# Patient Record
Sex: Male | Born: 1955
Health system: Southern US, Community
[De-identification: ages and names within clinical notes are randomized; demographics above are authoritative.]

## PROBLEM LIST (undated history)

## (undated) DIAGNOSIS — G4730 Sleep apnea, unspecified: Secondary | ICD-10-CM

## (undated) DIAGNOSIS — E119 Type 2 diabetes mellitus without complications: Secondary | ICD-10-CM

## (undated) DIAGNOSIS — E785 Hyperlipidemia, unspecified: Secondary | ICD-10-CM

## (undated) DIAGNOSIS — I2510 Atherosclerotic heart disease of native coronary artery without angina pectoris: Secondary | ICD-10-CM

## (undated) DIAGNOSIS — K2210 Ulcer of esophagus without bleeding: Secondary | ICD-10-CM

## (undated) DIAGNOSIS — I10 Essential (primary) hypertension: Secondary | ICD-10-CM

## (undated) DIAGNOSIS — N183 Chronic kidney disease, stage 3 unspecified: Secondary | ICD-10-CM

## (undated) HISTORY — DX: Type 2 diabetes mellitus without complications: E11.9

## (undated) HISTORY — DX: Ulcer of esophagus without bleeding: K22.10

## (undated) HISTORY — PX: EYE SURGERY: SHX253

## (undated) HISTORY — DX: Sleep apnea, unspecified: G47.30

## (undated) HISTORY — PX: CATARACT EXTRACTION: SUR2

## (undated) HISTORY — PX: INTRAOCULAR PROSTHESES INSERTION: SHX360

## (undated) HISTORY — PX: CORONARY ANGIOPLASTY WITH STENT PLACEMENT: SHX49

## (undated) HISTORY — PX: ANKLE SURGERY: SHX546

---

## 2008-11-05 ENCOUNTER — Ambulatory Visit: Payer: Self-pay | Admitting: Diagnostic Radiology

## 2008-11-05 ENCOUNTER — Emergency Department (HOSPITAL_BASED_OUTPATIENT_CLINIC_OR_DEPARTMENT_OTHER): Admission: EM | Admit: 2008-11-05 | Discharge: 2008-11-05 | Payer: Self-pay | Admitting: Emergency Medicine

## 2010-08-16 LAB — POCT CARDIAC MARKERS
CKMB, poc: 2.3 ng/mL (ref 1.0–8.0)
Myoglobin, poc: >500 ng/mL (ref 12–200)
Troponin i, poc: <0.05 ng/mL (ref 0.00–0.09)

## 2010-08-16 LAB — BASIC METABOLIC PANEL
BUN: 34 mg/dL — ABNORMAL HIGH (ref 6–23)
CO2: 22 mEq/L (ref 19–32)
Calcium: 8.4 mg/dL (ref 8.4–10.5)
Chloride: 109 mEq/L (ref 96–112)
Creatinine, Ser: 1.8 mg/dL — ABNORMAL HIGH (ref 0.4–1.5)
GFR calc Af Amer: 48 mL/min — ABNORMAL LOW (ref >60)
GFR calc non Af Amer: 40 mL/min — ABNORMAL LOW (ref >60)
Glucose, Bld: 240 mg/dL — ABNORMAL HIGH (ref 70–99)
Potassium: 5.5 mEq/L — ABNORMAL HIGH (ref 3.5–5.1)
Sodium: 139 mEq/L (ref 135–145)

## 2010-08-16 LAB — CBC
HCT: 34.7 % — ABNORMAL LOW (ref 39.0–52.0)
Hemoglobin: 11.9 g/dL — ABNORMAL LOW (ref 13.0–17.0)
MCHC: 34.4 g/dL (ref 30.0–36.0)
MCV: 92.5 fL (ref 78.0–100.0)
Platelets: 244 10*3/uL (ref 150–400)
RBC: 3.75 MIL/uL — ABNORMAL LOW (ref 4.22–5.81)
RDW: 12.3 % (ref 11.5–15.5)
WBC: 8.3 10*3/uL (ref 4.0–10.5)

## 2010-08-16 LAB — DIFFERENTIAL
Basophils Absolute: 0 10*3/uL (ref 0.0–0.1)
Basophils Relative: 0 % (ref 0–1)
Eosinophils Absolute: 0 10*3/uL (ref 0.0–0.7)
Eosinophils Relative: 1 % (ref 0–5)
Lymphocytes Relative: 17 % (ref 12–46)
Lymphs Abs: 1.4 10*3/uL (ref 0.7–4.0)
Monocytes Absolute: 0.6 10*3/uL (ref 0.1–1.0)
Monocytes Relative: 7 % (ref 3–12)
Neutro Abs: 6.3 10*3/uL (ref 1.7–7.7)
Neutrophils Relative %: 75 % (ref 43–77)

## 2013-01-13 ENCOUNTER — Encounter: Payer: Self-pay | Admitting: Family Medicine

## 2013-01-13 ENCOUNTER — Ambulatory Visit (INDEPENDENT_AMBULATORY_CARE_PROVIDER_SITE_OTHER): Payer: 59 | Admitting: Family Medicine

## 2013-01-13 VITALS — BP 106/78 | HR 90 | Temp 98.2°F | Resp 16 | Ht 71.0 in | Wt 206.0 lb

## 2013-01-13 DIAGNOSIS — M79609 Pain in unspecified limb: Secondary | ICD-10-CM

## 2013-01-13 DIAGNOSIS — M109 Gout, unspecified: Secondary | ICD-10-CM

## 2013-01-13 DIAGNOSIS — E119 Type 2 diabetes mellitus without complications: Secondary | ICD-10-CM

## 2013-01-13 DIAGNOSIS — M79672 Pain in left foot: Secondary | ICD-10-CM

## 2013-01-13 DIAGNOSIS — E1129 Type 2 diabetes mellitus with other diabetic kidney complication: Secondary | ICD-10-CM | POA: Insufficient documentation

## 2013-01-13 LAB — POCT CBC
Granulocyte percent: 68.9 %G (ref 37–80)
HCT, POC: 44.8 % (ref 43.5–53.7)
Hemoglobin: 14.8 g/dL (ref 14.1–18.1)
Lymph, poc: 1.6 (ref 0.6–3.4)
MCH, POC: 30.5 pg (ref 27–31.2)
MCHC: 33 g/dL (ref 31.8–35.4)
MCV: 92.2 fL (ref 80–97)
MID (cbc): 0.4 (ref 0–0.9)
MPV: 8.3 fL (ref 0–99.8)
POC Granulocyte: 4.4 (ref 2–6.9)
POC LYMPH PERCENT: 24.6 %L (ref 10–50)
POC MID %: 6.5 %M (ref 0–12)
Platelet Count, POC: 280 10*3/uL (ref 142–424)
RBC: 4.86 M/uL (ref 4.69–6.13)
RDW, POC: 13.2 %
WBC: 6.4 10*3/uL (ref 4.6–10.2)

## 2013-01-13 LAB — URIC ACID: Uric Acid, Serum: 7.1 mg/dL (ref 4.0–7.8)

## 2013-01-13 MED ORDER — TRAMADOL HCL 50 MG PO TABS: 50.0000 mg | ORAL_TABLET | Freq: Three times a day (TID) | ORAL | Status: DC | PRN

## 2013-01-13 MED ORDER — COLCHICINE 0.6 MG PO TABS: ORAL_TABLET | ORAL | Status: DC

## 2013-01-13 MED ORDER — CEPHALEXIN 500 MG PO CAPS: 500.0000 mg | ORAL_CAPSULE | Freq: Two times a day (BID) | ORAL | Status: DC

## 2013-01-13 MED ORDER — INDOMETHACIN 50 MG PO CAPS: 50.0000 mg | ORAL_CAPSULE | Freq: Three times a day (TID) | ORAL | Status: DC

## 2013-01-13 NOTE — Patient Instructions (Addendum)
It appears that you have gout in your left big toe.   Use the colchicine as directed, and the indomethacin and tramadol as needed for pain (the tramadol can make you a bit sleepy).  The indomethacin could raise your blood pressure- keep an eye on this if possible.    You may also use the keflex rx for possible infection.   However, with a normal white blood cell count an infection is less likely.  Let me know if you are not feeling better in the next few days- Sooner if worse.

## 2013-01-13 NOTE — Progress Notes (Signed)
[facility] [address], [place] [address] [phone]  Date:  01/13/2013   Name:  Hayden Williams   DOB:  1955-05-27   MRN:  [id number]  PCP:  No primary provider on file.    Chief Complaint: Foot Pain   History of Present Illness:  Hayden Williams is a 57 y.o. very pleasant male patient who presents with the following:  He is here today with left foot pain.  He has noted a bunion for several years but has never had gout or pain like this in the past.  NKI.  He noted onset of pain at the first MCP about 3 days ago.  He is not aware of any family history of gout.   No unusual foods or recent shellfish.    He has a history of IDDM.  His PCP is with [facility].   He has not tried any medications as of yet for his foot pain.   Is is otherwise generally healthy  NKDA Here today with his wife. Never a smoker There are no active problems to display for this patient.   Past Medical History  Diagnosis Date  . Diabetes mellitus without complication     Past Surgical History  Procedure Laterality Date  . Eye surgery      History  Substance Use Topics  . Smoking status: Never Smoker   . Smokeless tobacco: Never Used  . Alcohol Use: No    Family History  Problem Relation Age of Onset  . Diabetes Mother   . Heart disease Mother   . Cancer Father   . Diabetes Sister     No Known Allergies  Medication list has been reviewed and updated.  No current outpatient prescriptions on file prior to visit.   No current facility-administered medications on file prior to visit.    Review of Systems:  As per HPI- otherwise negative.   Physical Examination: Filed Vitals:   01/13/13 1439  BP: 106/78  Pulse: 100  Temp: 99 F (37.2 C)  Resp: 16   Filed Vitals:   01/13/13 1439  Height: 5\' 11"  (1.803 m)  Weight: 206 lb (93.441 kg)   Body mass index is 28.74 kg/(m^2). Ideal Body Weight: Weight in (lb) to have BMI = 25: 178.9  GEN: WDWN, NAD,  Non-toxic, A & O x 3, looks well HEENT: Atraumatic, Normocephalic. Neck supple. No masses, No LAD. Ears and Nose: No external deformity. CV: RRR, No M/G/R. No JVD. No thrill. No extra heart sounds. PULM: CTA B, no wheezes, crackles, rhonchi. No retractions. No resp. distress. No accessory muscle use. ABD: S, NT, ND, +BS. No rebound. No HSM. EXTR: No c/c/e NEURO favoring left slightly PSYCH: Normally interactive. Conversant. Not depressed or anxious appearing.  Calm demeanor.  Left foot: the 1st MTP is red, slightly swollen, warm and tender.  There is some tracking of the redness into the mid- foot.  Normal pulses and perfusion.    Results for orders placed in visit on 01/13/13  POCT CBC      Result Value Range   WBC 6.4  4.6 - 10.2 K/uL   Lymph, poc 1.6  0.6 - 3.4   POC LYMPH PERCENT 24.6  10 - 50 %L   MID (cbc) 0.4  0 - 0.9   POC MID % 6.5  0 - 12 %M   POC Granulocyte 4.4  2 - 6.9   Granulocyte percent 68.9  37 - 80 %G  RBC 4.86  4.69 - 6.13 M/uL   Hemoglobin 14.8  14.1 - 18.1 g/dL   HCT, POC 81.1  91.4 - 53.7 %   MCV 92.2  80 - 97 fL   MCH, POC 30.5  27 - 31.2 pg   MCHC 33.0  31.8 - 35.4 g/dL   RDW, POC 78.2     Platelet Count, POC 280  142 - 424 K/uL   MPV 8.3  0 - 99.8 fL     Assessment and Plan: Gout attack - Plan: Uric Acid, colchicine 0.6 MG tablet, indomethacin (INDOCIN) 50 MG capsule, cephALEXin (KEFLEX) 500 MG capsule  Type II or unspecified type diabetes mellitus without mention of complication, not stated as uncontrolled  Left foot pain - Plan: POCT CBC, traMADol (ULTRAM) 50 MG tablet  Likely gout.  Will treat with colchicine, indocin and tramadol if needed for pain.  Declines crutches today.  Await uric acid level.   Gave rx for keflex due to slight tracking of redness.  However, normal WBC count is reassuring.  He can start the keflex or wait and see how he is tomorrow.    Signed Abbe Amsterdam, MD

## 2013-01-20 ENCOUNTER — Encounter: Payer: Self-pay | Admitting: Family Medicine

## 2013-01-25 ENCOUNTER — Other Ambulatory Visit (INDEPENDENT_AMBULATORY_CARE_PROVIDER_SITE_OTHER): Payer: 59

## 2013-01-25 DIAGNOSIS — E119 Type 2 diabetes mellitus without complications: Secondary | ICD-10-CM

## 2013-01-25 DIAGNOSIS — M109 Gout, unspecified: Secondary | ICD-10-CM

## 2013-01-25 LAB — BASIC METABOLIC PANEL
BUN: 21 mg/dL (ref 6–23)
CO2: 25 mEq/L (ref 19–32)
Calcium: 8.4 mg/dL (ref 8.4–10.5)
Chloride: 109 mEq/L (ref 96–112)
Creat: 1.59 mg/dL — ABNORMAL HIGH (ref 0.50–1.35)
Glucose, Bld: 199 mg/dL — ABNORMAL HIGH (ref 70–99)
Potassium: 3.8 mEq/L (ref 3.5–5.3)
Sodium: 140 mEq/L (ref 135–145)

## 2013-01-25 NOTE — Addendum Note (Signed)
Addended by: Cira Rue R on: 01/25/2013 12:35 PM   Modules accepted: Orders

## 2013-01-25 NOTE — Progress Notes (Signed)
Patient here for labs only.  BMP not sent out at last visit.  Our error/rcoe

## 2013-01-26 ENCOUNTER — Encounter: Payer: Self-pay | Admitting: Family Medicine

## 2013-01-26 NOTE — Progress Notes (Signed)
Called and LMOM. Let him know that I received his BMP.  It looks ok except his renal funcation is still up- this is better that it was in 2010 (the last labs we have on file).  Be sure to follow-up with PCP as directed, will mail him a copy of his labs Creat clearance is 59.7

## 2013-11-19 ENCOUNTER — Other Ambulatory Visit: Payer: Self-pay | Admitting: Emergency Medicine

## 2013-11-19 DIAGNOSIS — R2241 Localized swelling, mass and lump, right lower limb: Secondary | ICD-10-CM

## 2013-11-20 ENCOUNTER — Ambulatory Visit
Admission: RE | Admit: 2013-11-20 | Discharge: 2013-11-20 | Disposition: A | Discharge status: Home / Self Care | Payer: 59 | Source: Ambulatory Visit | Attending: Emergency Medicine | Admitting: Emergency Medicine

## 2013-11-20 ENCOUNTER — Encounter (INDEPENDENT_AMBULATORY_CARE_PROVIDER_SITE_OTHER): Payer: Self-pay

## 2013-11-20 DIAGNOSIS — R2241 Localized swelling, mass and lump, right lower limb: Secondary | ICD-10-CM

## 2014-12-02 ENCOUNTER — Encounter (HOSPITAL_COMMUNITY): Payer: Self-pay | Admitting: Emergency Medicine

## 2014-12-02 ENCOUNTER — Inpatient Hospital Stay (HOSPITAL_COMMUNITY)
Admission: EM | Admit: 2014-12-02 | Discharge: 2014-12-03 | DRG: 247 | Disposition: A | Discharge status: Home / Self Care | Payer: 59 | Attending: Cardiology | Admitting: Cardiology

## 2014-12-02 ENCOUNTER — Encounter (HOSPITAL_COMMUNITY)
Admission: EM | Disposition: A | Discharge status: Home / Self Care | Payer: Self-pay | Source: Home / Self Care | Attending: Cardiology

## 2014-12-02 ENCOUNTER — Emergency Department (HOSPITAL_COMMUNITY): Payer: 59

## 2014-12-02 DIAGNOSIS — Z955 Presence of coronary angioplasty implant and graft: Secondary | ICD-10-CM

## 2014-12-02 DIAGNOSIS — I25110 Atherosclerotic heart disease of native coronary artery with unstable angina pectoris: Secondary | ICD-10-CM | POA: Diagnosis not present

## 2014-12-02 DIAGNOSIS — E785 Hyperlipidemia, unspecified: Secondary | ICD-10-CM | POA: Diagnosis not present

## 2014-12-02 DIAGNOSIS — Z833 Family history of diabetes mellitus: Secondary | ICD-10-CM | POA: Diagnosis not present

## 2014-12-02 DIAGNOSIS — I129 Hypertensive chronic kidney disease with stage 1 through stage 4 chronic kidney disease, or unspecified chronic kidney disease: Secondary | ICD-10-CM | POA: Diagnosis present

## 2014-12-02 DIAGNOSIS — E119 Type 2 diabetes mellitus without complications: Secondary | ICD-10-CM | POA: Diagnosis present

## 2014-12-02 DIAGNOSIS — R079 Chest pain, unspecified: Secondary | ICD-10-CM

## 2014-12-02 DIAGNOSIS — R0789 Other chest pain: Secondary | ICD-10-CM | POA: Diagnosis not present

## 2014-12-02 DIAGNOSIS — I200 Unstable angina: Secondary | ICD-10-CM | POA: Diagnosis present

## 2014-12-02 DIAGNOSIS — E1129 Type 2 diabetes mellitus with other diabetic kidney complication: Secondary | ICD-10-CM

## 2014-12-02 DIAGNOSIS — I249 Acute ischemic heart disease, unspecified: Secondary | ICD-10-CM

## 2014-12-02 DIAGNOSIS — Z8249 Family history of ischemic heart disease and other diseases of the circulatory system: Secondary | ICD-10-CM

## 2014-12-02 DIAGNOSIS — Z794 Long term (current) use of insulin: Secondary | ICD-10-CM | POA: Diagnosis not present

## 2014-12-02 DIAGNOSIS — N183 Chronic kidney disease, stage 3 unspecified: Secondary | ICD-10-CM

## 2014-12-02 DIAGNOSIS — I2510 Atherosclerotic heart disease of native coronary artery without angina pectoris: Secondary | ICD-10-CM

## 2014-12-02 DIAGNOSIS — R072 Precordial pain: Secondary | ICD-10-CM | POA: Insufficient documentation

## 2014-12-02 HISTORY — DX: Chronic kidney disease, stage 3 (moderate): N18.3

## 2014-12-02 HISTORY — DX: Chronic kidney disease, stage 3 unspecified: N18.30

## 2014-12-02 HISTORY — PX: CARDIAC CATHETERIZATION: SHX172

## 2014-12-02 HISTORY — DX: Hyperlipidemia, unspecified: E78.5

## 2014-12-02 HISTORY — DX: Atherosclerotic heart disease of native coronary artery without angina pectoris: I25.10

## 2014-12-02 HISTORY — DX: Essential (primary) hypertension: I10

## 2014-12-02 LAB — BASIC METABOLIC PANEL
Anion gap: 9 (ref 5–15)
BUN: 22 mg/dL — ABNORMAL HIGH (ref 6–20)
CO2: 23 mmol/L (ref 22–32)
Calcium: 8.3 mg/dL — ABNORMAL LOW (ref 8.9–10.3)
Chloride: 108 mmol/L (ref 101–111)
Creatinine, Ser: 1.77 mg/dL — ABNORMAL HIGH (ref 0.61–1.24)
GFR calc Af Amer: 47 mL/min — ABNORMAL LOW (ref >60)
GFR calc non Af Amer: 41 mL/min — ABNORMAL LOW (ref >60)
Glucose, Bld: 198 mg/dL — ABNORMAL HIGH (ref 65–99)
Potassium: 3.8 mmol/L (ref 3.5–5.1)
Sodium: 140 mmol/L (ref 135–145)

## 2014-12-02 LAB — CBC
HCT: 39.7 % (ref 39.0–52.0)
Hemoglobin: 14.1 g/dL (ref 13.0–17.0)
MCH: 30.3 pg (ref 26.0–34.0)
MCHC: 35.5 g/dL (ref 30.0–36.0)
MCV: 85.4 fL (ref 78.0–100.0)
Platelets: 242 10*3/uL (ref 150–400)
RBC: 4.65 MIL/uL (ref 4.22–5.81)
RDW: 12.5 % (ref 11.5–15.5)
WBC: 6 10*3/uL (ref 4.0–10.5)

## 2014-12-02 LAB — URINALYSIS, ROUTINE W REFLEX MICROSCOPIC
Bilirubin Urine: NEGATIVE
Glucose, UA: 250 mg/dL — AB
Ketones, ur: NEGATIVE mg/dL
Leukocytes, UA: NEGATIVE
Nitrite: NEGATIVE
Protein, ur: >300 mg/dL — AB
Specific Gravity, Urine: 1.026 (ref 1.005–1.030)
Urobilinogen, UA: 0.2 mg/dL (ref 0.0–1.0)
pH: 5.5 (ref 5.0–8.0)

## 2014-12-02 LAB — MRSA PCR SCREENING: MRSA by PCR: NEGATIVE

## 2014-12-02 LAB — POCT ACTIVATED CLOTTING TIME: Activated Clotting Time: 294 seconds

## 2014-12-02 LAB — GLUCOSE, CAPILLARY: Glucose-Capillary: 139 mg/dL — ABNORMAL HIGH (ref 65–99)

## 2014-12-02 LAB — I-STAT TROPONIN, ED: Troponin i, poc: 0.01 ng/mL (ref 0.00–0.08)

## 2014-12-02 LAB — URINE MICROSCOPIC-ADD ON

## 2014-12-02 SURGERY — LEFT HEART CATH AND CORONARY ANGIOGRAPHY
Anesthesia: LOCAL

## 2014-12-02 MED ORDER — HEPARIN SODIUM (PORCINE) 1000 UNIT/ML IJ SOLN
INTRAMUSCULAR | Status: DC | PRN
  Administered 2014-12-02 (×2): 5000 [IU] via INTRAVENOUS

## 2014-12-02 MED ORDER — OXYCODONE-ACETAMINOPHEN 5-325 MG PO TABS: 1.0000 | ORAL_TABLET | ORAL | Status: DC | PRN

## 2014-12-02 MED ORDER — CLOPIDOGREL BISULFATE 300 MG PO TABS
ORAL_TABLET | ORAL | Status: DC | PRN
  Administered 2014-12-02 (×2): 300 mg via ORAL

## 2014-12-02 MED ORDER — VERAPAMIL HCL 2.5 MG/ML IV SOLN
INTRAVENOUS | Status: DC | PRN
  Administered 2014-12-02: 15:00:00 via INTRA_ARTERIAL

## 2014-12-02 MED ORDER — INSULIN GLARGINE 100 UNIT/ML ~~LOC~~ SOLN: 31.0000 [IU] | Freq: Every day | SUBCUTANEOUS | Status: DC

## 2014-12-02 MED ORDER — ASPIRIN 81 MG PO CHEW
324.0000 mg | CHEWABLE_TABLET | Freq: Once | ORAL | Status: AC
Start: 2014-12-02 — End: 2014-12-02
  Administered 2014-12-02: 243 mg via ORAL
  Filled 2014-12-02: qty 4

## 2014-12-02 MED ORDER — HYDRALAZINE HCL 20 MG/ML IJ SOLN
INTRAMUSCULAR | Status: AC
  Filled 2014-12-02: qty 1

## 2014-12-02 MED ORDER — INSULIN GLARGINE 100 UNIT/ML ~~LOC~~ SOLN
45.0000 [IU] | Freq: Every day | SUBCUTANEOUS | Status: DC
  Administered 2014-12-02: 45 [IU] via SUBCUTANEOUS
  Filled 2014-12-02 (×2): qty 0.45

## 2014-12-02 MED ORDER — SODIUM CHLORIDE 0.9 % IV SOLN
INTRAVENOUS | Status: AC
  Administered 2014-12-02: 19:00:00 via INTRAVENOUS

## 2014-12-02 MED ORDER — SODIUM CHLORIDE 0.9 % IV SOLN: 250.0000 mL | INTRAVENOUS | Status: DC | PRN

## 2014-12-02 MED ORDER — CLOPIDOGREL BISULFATE 75 MG PO TABS
75.0000 mg | ORAL_TABLET | Freq: Every day | ORAL | Status: DC
  Administered 2014-12-03: 75 mg via ORAL
  Filled 2014-12-02 (×2): qty 1

## 2014-12-02 MED ORDER — SODIUM CHLORIDE 0.9 % IV SOLN
INTRAVENOUS | Status: DC | PRN
  Administered 2014-12-02: 150 mL/h via INTRAVENOUS

## 2014-12-02 MED ORDER — ATORVASTATIN CALCIUM 80 MG PO TABS
80.0000 mg | ORAL_TABLET | Freq: Every day | ORAL | Status: DC
  Administered 2014-12-02: 80 mg via ORAL
  Filled 2014-12-02 (×2): qty 1

## 2014-12-02 MED ORDER — ASPIRIN 81 MG PO CHEW
81.0000 mg | CHEWABLE_TABLET | ORAL | Status: AC
  Administered 2014-12-02: 81 mg via ORAL
  Filled 2014-12-02: qty 1

## 2014-12-02 MED ORDER — FENTANYL CITRATE (PF) 100 MCG/2ML IJ SOLN
INTRAMUSCULAR | Status: DC | PRN
  Administered 2014-12-02: 50 ug via INTRAVENOUS
  Administered 2014-12-02: 25 ug via INTRAVENOUS

## 2014-12-02 MED ORDER — GLYBURIDE 5 MG PO TABS
5.0000 mg | ORAL_TABLET | Freq: Two times a day (BID) | ORAL | Status: DC
  Administered 2014-12-02: 5 mg via ORAL
  Filled 2014-12-02 (×2): qty 1

## 2014-12-02 MED ORDER — HYDRALAZINE HCL 20 MG/ML IJ SOLN
INTRAMUSCULAR | Status: DC | PRN
  Administered 2014-12-02: 10 mg via INTRAVENOUS

## 2014-12-02 MED ORDER — LIDOCAINE HCL (PF) 1 % IJ SOLN
INTRAMUSCULAR | Status: AC
  Filled 2014-12-02: qty 30

## 2014-12-02 MED ORDER — LOSARTAN POTASSIUM 50 MG PO TABS
50.0000 mg | ORAL_TABLET | Freq: Every day | ORAL | Status: DC
  Administered 2014-12-03: 50 mg via ORAL
  Filled 2014-12-02 (×3): qty 1

## 2014-12-02 MED ORDER — HYDRALAZINE HCL 20 MG/ML IJ SOLN
20.0000 mg | INTRAMUSCULAR | Status: DC | PRN
  Administered 2014-12-02 – 2014-12-03 (×2): 20 mg via INTRAVENOUS
  Filled 2014-12-02 (×2): qty 1

## 2014-12-02 MED ORDER — SODIUM CHLORIDE 0.9 % IJ SOLN: 3.0000 mL | Freq: Two times a day (BID) | INTRAMUSCULAR | Status: DC

## 2014-12-02 MED ORDER — ONDANSETRON HCL 4 MG/2ML IJ SOLN
4.0000 mg | Freq: Four times a day (QID) | INTRAMUSCULAR | Status: DC | PRN
  Administered 2014-12-02: 4 mg via INTRAVENOUS
  Filled 2014-12-02: qty 2

## 2014-12-02 MED ORDER — TRIAMTERENE-HCTZ 37.5-25 MG PO CAPS
1.0000 | ORAL_CAPSULE | Freq: Every day | ORAL | Status: DC
  Administered 2014-12-03: 1 via ORAL
  Filled 2014-12-02: qty 1

## 2014-12-02 MED ORDER — NITROGLYCERIN 1 MG/10 ML FOR IR/CATH LAB
INTRA_ARTERIAL | Status: AC
  Filled 2014-12-02: qty 10

## 2014-12-02 MED ORDER — MIDAZOLAM HCL 2 MG/2ML IJ SOLN
INTRAMUSCULAR | Status: AC
  Filled 2014-12-02: qty 2

## 2014-12-02 MED ORDER — SODIUM CHLORIDE 0.9 % IJ SOLN: 3.0000 mL | INTRAMUSCULAR | Status: DC | PRN

## 2014-12-02 MED ORDER — MIDAZOLAM HCL 2 MG/2ML IJ SOLN
INTRAMUSCULAR | Status: DC | PRN
  Administered 2014-12-02: 1 mg via INTRAVENOUS
  Administered 2014-12-02: 2 mg via INTRAVENOUS

## 2014-12-02 MED ORDER — FENTANYL CITRATE (PF) 100 MCG/2ML IJ SOLN
INTRAMUSCULAR | Status: AC
  Filled 2014-12-02: qty 2

## 2014-12-02 MED ORDER — ASPIRIN 81 MG PO CHEW
81.0000 mg | CHEWABLE_TABLET | Freq: Every day | ORAL | Status: DC
  Administered 2014-12-03: 81 mg via ORAL
  Filled 2014-12-02: qty 1

## 2014-12-02 MED ORDER — ACETAMINOPHEN 325 MG PO TABS: 650.0000 mg | ORAL_TABLET | ORAL | Status: DC | PRN

## 2014-12-02 MED ORDER — MORPHINE SULFATE 2 MG/ML IJ SOLN: 2.0000 mg | INTRAMUSCULAR | Status: DC | PRN

## 2014-12-02 MED ORDER — METOPROLOL TARTRATE 12.5 MG HALF TABLET
25.0000 mg | ORAL_TABLET | Freq: Two times a day (BID) | ORAL | Status: DC
  Administered 2014-12-02 – 2014-12-03 (×2): 25 mg via ORAL
  Filled 2014-12-02 (×3): qty 2

## 2014-12-02 MED ORDER — INSULIN GLARGINE 100 UNIT/ML ~~LOC~~ SOLN
10.0000 [IU] | Freq: Every day | SUBCUTANEOUS | Status: DC
  Filled 2014-12-02: qty 0.1

## 2014-12-02 MED ORDER — SODIUM CHLORIDE 0.9 % IJ SOLN
3.0000 mL | Freq: Two times a day (BID) | INTRAMUSCULAR | Status: DC
  Administered 2014-12-02 – 2014-12-03 (×2): 3 mL via INTRAVENOUS

## 2014-12-02 MED ORDER — SODIUM CHLORIDE 0.9 % IV SOLN
INTRAVENOUS | Status: DC
  Administered 2014-12-02: 15:00:00 via INTRAVENOUS

## 2014-12-02 MED ORDER — MIDAZOLAM HCL 2 MG/2ML IJ SOLN
INTRAMUSCULAR | Status: AC
Start: 2014-12-02 — End: 2014-12-02
  Filled 2014-12-02: qty 2

## 2014-12-02 MED ORDER — HEPARIN (PORCINE) IN NACL 2-0.9 UNIT/ML-% IJ SOLN
INTRAMUSCULAR | Status: AC
  Filled 2014-12-02: qty 1000

## 2014-12-02 MED ORDER — CLOPIDOGREL BISULFATE 300 MG PO TABS
ORAL_TABLET | ORAL | Status: AC
  Filled 2014-12-02: qty 2

## 2014-12-02 SURGICAL SUPPLY — 19 items
BALLN EUPHORA RX 2.0X12 (BALLOONS) ×2
BALLN ~~LOC~~ EUPHORA RX 2.5X12 (BALLOONS) ×2
BALLOON EUPHORA RX 2.0X12 (BALLOONS) ×1 IMPLANT
BALLOON ~~LOC~~ EUPHORA RX 2.5X12 (BALLOONS) ×1 IMPLANT
CATH INFINITI 5 FR JL3.5 (CATHETERS) ×2 IMPLANT
CATH INFINITI 5FR ANG PIGTAIL (CATHETERS) ×2 IMPLANT
CATH INFINITI JR4 5F (CATHETERS) ×2 IMPLANT
CATH VISTA GUIDE 6FR JR4 (CATHETERS) ×2 IMPLANT
DEVICE RAD COMP TR BAND LRG (VASCULAR PRODUCTS) ×2 IMPLANT
GLIDESHEATH SLEND SS 6F .021 (SHEATH) ×2 IMPLANT
KIT ENCORE 26 ADVANTAGE (KITS) ×2 IMPLANT
KIT HEART LEFT (KITS) ×2 IMPLANT
PACK CARDIAC CATHETERIZATION (CUSTOM PROCEDURE TRAY) ×2 IMPLANT
STENT PROMUS PREM MR 2.25X20 (Permanent Stent) ×2 IMPLANT
SYR MEDRAD MARK V 150ML (SYRINGE) ×2 IMPLANT
TRANSDUCER W/STOPCOCK (MISCELLANEOUS) ×2 IMPLANT
TUBING CIL FLEX 10 FLL-RA (TUBING) ×2 IMPLANT
WIRE COUGAR XT STRL 190CM (WIRE) ×2 IMPLANT
WIRE SAFE-T 1.5MM-J .035X260CM (WIRE) ×2 IMPLANT

## 2014-12-02 NOTE — Interval H&P Note (Signed)
History and Physical Interval Note:  12/02/2014 2:59 PM  Hayden Williams  has presented today for surgery, with the diagnosis of unstable angina. The various methods of treatment have been discussed with the patient and family. After consideration of risks, benefits and other options for treatment, the patient has consented to  Procedure(s): Left Heart Cath and Coronary Angiography (N/A) as a surgical intervention .  The patient's history has been reviewed, patient examined, no change in status, stable for surgery.  I have reviewed the patient's chart and labs.  Questions were answered to the patient's satisfaction.    Cath Lab Visit (complete for each Cath Lab visit)  Clinical Evaluation Leading to the Procedure:   ACS: No.  Non-ACS:    Anginal Classification: CCS III  Anti-ischemic medical therapy: No Therapy  Non-Invasive Test Results: No non-invasive testing performed  Prior CABG: No previous CABG         MCALHANY,CHRISTOPHER

## 2014-12-02 NOTE — Progress Notes (Signed)
eLink Physician-Brief Progress Note Patient Name: Hayden Williams DOB: 02-08-1956 MRN: [id number]   Date of Service  12/02/2014  HPI/Events of Note  59 yo male with PMH of DM. Admitted to the ICU with NSTEMI. Now s/p Cardiac Cath Lab with Stent to RCA. Current medical regimen includes ASA, Lipitor, Metoprolol, Cozaar, Glyburide, Dyazide, Hydralazine IV PRN and Plavix. Management per Cardiology.   eICU Interventions  Continue present management.      Intervention Category Evaluation Type: New Patient Evaluation  Lysle Dingwall 12/02/2014, 6:54 PM

## 2014-12-02 NOTE — ED Provider Notes (Signed)
CSN: [id number]     Arrival date & time 12/02/14  1132 History   First MD Initiated Contact with Patient 12/02/14 1217     Chief Complaint  Patient presents with  . Chest Pain     (Consider location/radiation/quality/duration/timing/severity/associated sxs/prior Treatment) HPI Hayden Williams is a 59 y.o. male with a history of hypertension, hyperlipidemia, diabetes, comes in for evaluation of chest discomfort. Patient states for the past 4 weeks he has noticed every time he exerts himself walking, he will become short winded with a burning sensation in his chest. The symptoms will usually abate with rest. He denies any associated diaphoresis, nausea, vomiting, numbness or weakness. He reports having been started on a new blood pressure medicine 2 weeks ago. 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 Denies any discomfort now in the ED. Denies any extended travel, cough, history of blood clot, recent surgeries.  Past Medical History  Diagnosis Date  . Diabetes mellitus without complication    Past Surgical History  Procedure Laterality Date  . Eye surgery     Family History  Problem Relation Age of Onset  . Diabetes Mother   . Heart disease Mother   . Cancer Father   . Diabetes Sister    History  Substance Use Topics  . Smoking status: Never Smoker   . Smokeless tobacco: Never Used  . Alcohol Use: No    Review of Systems A 10 point review of systems was completed and was negative except for pertinent positives and negatives as mentioned in the history of present illness     Allergies  Review of patient's allergies indicates no known allergies.  Home Medications   Prior to Admission medications   Medication Sig Start Date End Date Taking? Authorizing Provider  cephALEXin (KEFLEX) 500 MG capsule Take 1 capsule (500 mg total) by mouth 2 (two) times daily. 01/13/13   Gay Filler Copland, MD  colchicine 0.6 MG tablet Take 2 pills, then take 1 an  hour later.  Repeat as needed for gout attack 01/13/13   Darreld Mclean, MD  glyBURIDE (DIABETA) 5 MG tablet Take 5 mg by mouth 2 (two) times daily with a meal.     Historical Provider, MD  indomethacin (INDOCIN) 50 MG capsule Take 1 capsule (50 mg total) by mouth 3 (three) times daily with meals. As needed for gout 01/13/13   Gay Filler Copland, MD  insulin glargine (LANTUS) 100 UNIT/ML injection Inject 31 Units into the skin at bedtime.    Historical Provider, MD  losartan (COZAAR) 25 MG tablet Take 25 mg by mouth daily.    Historical Provider, MD  traMADol (ULTRAM) 50 MG tablet Take 1 tablet (50 mg total) by mouth every 8 (eight) hours as needed for pain. 01/13/13   Gay Filler Copland, MD  triamterene-hydrochlorothiazide (DYAZIDE) 37.5-25 MG per capsule Take 1 capsule by mouth every morning.    Historical Provider, MD   BP 185/116 mmHg  Pulse 86  Temp(Src) 97.3 F (36.3 C) (Oral)  Resp 17  Ht 5\' 11"  (1.803 m)  Wt 224 lb (101.606 kg)  BMI 31.26 kg/m2  SpO2 97% Physical Exam  Constitutional: He is oriented to person, place, and time. He appears well-developed and well-nourished. No distress.  HENT:  Head: Normocephalic and atraumatic.  Mouth/Throat: Oropharynx is clear and moist.  Eyes: Conjunctivae are normal. Pupils are equal, round, and reactive to light. Right eye exhibits no discharge. Left eye exhibits no discharge.  No scleral icterus.  Neck: Normal range of motion. Neck supple. No JVD present.  Cardiovascular: Normal rate, regular rhythm, normal heart sounds and intact distal pulses.  Exam reveals no gallop and no friction rub.   No murmur heard. Pulmonary/Chest: Effort normal and breath sounds normal. No respiratory distress. He has no wheezes. He has no rales.  Abdominal: Soft. There is no tenderness.  Musculoskeletal: Normal range of motion. He exhibits no edema or tenderness.  Neurological: He is alert and oriented to person, place, and time.  Cranial Nerves II-XII grossly intact   Skin: Skin is warm and dry. No rash noted. He is not diaphoretic.  Psychiatric: He has a normal mood and affect.  Nursing note and vitals reviewed.   ED Course  Procedures (including critical care time) Labs Review Labs Reviewed  BASIC METABOLIC PANEL - Abnormal; Notable for the following:    Glucose, Bld 198 (*)    BUN 22 (*)    Creatinine, Ser 1.77 (*)    Calcium 8.3 (*)    GFR calc non Af Amer 41 (*)    GFR calc Af Amer 47 (*)    All other components within normal limits  URINALYSIS, ROUTINE W REFLEX MICROSCOPIC (NOT AT [facility]) - Abnormal; Notable for the following:    Glucose, UA 250 (*)    Hgb urine dipstick MODERATE (*)    Protein, ur >300 (*)    All other components within normal limits  CBC  URINE MICROSCOPIC-ADD ON  Randolm Idol, ED    Imaging Review Dg Chest 2 View  12/02/2014   CLINICAL DATA:  Chest pain for 1 month  EXAM: CHEST  2 VIEW  COMPARISON:  November 05, 2008  FINDINGS: The heart size and mediastinal contours are within normal limits. Both lungs are clear. The visualized skeletal structures are unremarkable.  IMPRESSION: No active cardiopulmonary disease.   Electronically Signed   By: Abelardo Diesel M.D.   On: 12/02/2014 12:49     EKG Interpretation None      ED ECG REPORT   Date: 12/02/2014  Rate: 89  Rhythm: normal sinus rhythm  QRS Axis: normal  Intervals: normal  ST/T Wave abnormalities: nonspecific T wave changes  Conduction Disutrbances:none  Narrative Interpretation: T wave inversions in aVF different from prior study in 2010  Old EKG Reviewed: changes noted  I have personally reviewed the EKG tracing and agree with the computerized printout as noted.  Meds given in ED:  Medications  0.9 %  sodium chloride infusion ( Intravenous Transfusing/Transfer 12/02/14 1452)  aspirin chewable tablet 81 mg (81 mg Oral Given 12/02/14 1444)  aspirin chewable tablet 324 mg (243 mg Oral Given 12/02/14 1450)    Current Discharge Medication List      Filed Vitals:   12/02/14 1430 12/02/14 1431 12/02/14 1506 12/02/14 1507  BP: 185/116 185/116    Pulse: 83 86    Temp:      TempSrc:      Resp: 16 17    Height:      Weight:      SpO2: 97% 95% 97% 97%    MDM  Patient with hypertension, diabetes and chronic kidney disease presents with 4 week history of increased exertional chest pain. T wave changes on EKG in aVF, different from prior study. Initial Troponin is negative. Chest x-ray shows no acute cardiopulmonary pathology. Patient denies any chest discomfort in the ED. Vital signs are stable. Consult cardiology, Dr. Ron Parker to see in the ED. Patient  admitted for cardiac cath. Prior to patient admission, I discussed and reviewed this case with my attending, Dr. Reather Converse Final diagnoses:  Chest pain, unspecified chest pain type        Comer Locket, PA-C 12/02/14 1517  Elnora Morrison, MD 12/02/14 [phone]

## 2014-12-02 NOTE — ED Notes (Signed)
Pt states has had chest pain-- "burning" with any exertion, short of breath with exertion-- has been going on for approx 1 month, recently started on BP med.

## 2014-12-02 NOTE — ED Notes (Signed)
Patient place on the Zoll Monitor to be transported to Cardiac Cath Lab.

## 2014-12-02 NOTE — H&P (Signed)
CARDIOLOGY HISTORY AND PHYSICAL   Patient ID: MARLAND REINE MRN: [id number]  DOB/AGE: [birth year] 59 y.o. Admit date: 12/02/2014  Primary Care Physician:  He has a primary physician in [facility].  Primary Cardiologist:   Clinical Summary Mr. Bidinger is a 59 y.o.male. He is active as a Librarian, academic on the third shift program. He's had burning in his chest with exertion that started about a month ago. In the past week it has increased in frequency. He came to the emergency room today for complete evaluation today. Marland Kitchen   No Known Allergies  Home Medications  (Not in a hospital admission)  Scheduled Medications    Infusions    PRN Medications    Past Medical History  Diagnosis Date  . Diabetes mellitus without complication     Past Surgical History  Procedure Laterality Date  . Eye surgery      Family History  Problem Relation Age of Onset  . Diabetes Mother   . Heart disease Mother   . Cancer Father   . Diabetes Sister     Social History Mr. Ashmore reports that he has never smoked. He has never used smokeless tobacco. Mr. Mendonca reports that he does not drink alcohol.  Review of Systems Patient denies fever, chills, headache, sweats, rash, change in vision, change in hearing, cough, nausea or vomiting, urinary symptoms. All other systems are reviewed and are negative.  Physical Examination Temp:  [97.3 F (36.3 C)] 97.3 F (36.3 C) (07/26 1140) Pulse Rate:  [77-87] 86 (07/26 1431) Resp:  [13-20] 17 (07/26 1431) BP: (166-185)/(98-123) 185/116 mmHg (07/26 1431) SpO2:  [94 %-98 %] 95 % (07/26 1431) Weight:  [224 lb (101.606 kg)] 224 lb (101.606 kg) (07/26 1140) No intake or output data in the 24 hours ending 12/02/14 1438  He is oriented to person time and place. Affect is normal. His right eye wanders to the right. Head is atraumatic. Sclera and conjunctiva are normal. There is no jugular venous distention. Lungs are clear. Respiratory effort is  unlabored. Cardiac exam reveals S1 and S2. There are no clicks or significant murmurs. Abdomen is soft. There is no peripheral edema. There are no musculoskeletal deformities. There are no skin rashes. Neurologic is grossly intact.    Lab Results  Basic Metabolic Panel:  Recent Labs Lab 12/02/14 1146  NA 140  K 3.8  CL 108  CO2 23  GLUCOSE 198*  BUN 22*  CREATININE 1.77*  CALCIUM 8.3*    Liver Function Tests: No results for input(s): AST, ALT, ALKPHOS, BILITOT, PROT, ALBUMIN in the last 168 hours.  CBC:  Recent Labs Lab 12/02/14 1146  WBC 6.0  HGB 14.1  HCT 39.7  MCV 85.4  PLT 242    Cardiac Enzymes: No results for input(s): CKTOTAL, CKMB, CKMBINDEX, TROPONINI in the last 168 hours.  BNP: Invalid input(s): POCBNP   Radiology Dg Chest 2 View  12/02/2014   CLINICAL DATA:  Chest pain for 1 month  EXAM: CHEST  2 VIEW  COMPARISON:  November 05, 2008  FINDINGS: The heart size and mediastinal contours are within normal limits. Both lungs are clear. The visualized skeletal structures are unremarkable.  IMPRESSION: No active cardiopulmonary disease.   Electronically Signed   By: Abelardo Diesel M.D.   On: 12/02/2014 12:49    Prior Cardiac Testing/Procedures:   ECG I have reviewed the current EKG. There are nonspecific ST-T wave changes. Ischemia cannot be ruled out.   Impression and Recommendations  Type 2 diabetes mellitus, controlled    The patient has not taken his insulin today. He works third shift.    Precordial chest pain     The patient's chest discomfort is consistent with crescendo angina. He has not had any rest pain. However his angina began in the past month and it is increasing in frequency and severity. His first troponin today is normal. He gives classic symptoms for increasing exertional angina. He has diabetes and hypertension. The best option is to proceed with cardiac catheterization. I discussed this with him and he is in agreement. Unfortunately we  are unable to proceed with catheterization this afternoon. I have explained risks and benefits to him. He would like to proceed.    CKD (chronic kidney disease) stage 3, GFR 30-59 ml/min    The patient's creatinine is 1.7. The creatinine was 1.7  Six years ago. It is clearly stable. I've given him    200 cc fluid bolus followed by 100 cc/h ordered. I've given him 4 chewable aspirin in the emergency room. We will proceed with cardiac catheterization today.   Daryel November, MD 12/02/2014, 2:38 PM

## 2014-12-03 ENCOUNTER — Encounter (HOSPITAL_COMMUNITY): Payer: Self-pay | Admitting: Cardiovascular Disease

## 2014-12-03 ENCOUNTER — Telehealth: Payer: Self-pay | Admitting: Cardiology

## 2014-12-03 DIAGNOSIS — I249 Acute ischemic heart disease, unspecified: Secondary | ICD-10-CM

## 2014-12-03 DIAGNOSIS — I2510 Atherosclerotic heart disease of native coronary artery without angina pectoris: Secondary | ICD-10-CM

## 2014-12-03 LAB — BASIC METABOLIC PANEL
Anion gap: 5 (ref 5–15)
BUN: 18 mg/dL (ref 6–20)
CO2: 24 mmol/L (ref 22–32)
Calcium: 8.2 mg/dL — ABNORMAL LOW (ref 8.9–10.3)
Chloride: 109 mmol/L (ref 101–111)
Creatinine, Ser: 1.7 mg/dL — ABNORMAL HIGH (ref 0.61–1.24)
GFR calc Af Amer: 49 mL/min — ABNORMAL LOW (ref >60)
GFR calc non Af Amer: 43 mL/min — ABNORMAL LOW (ref >60)
Glucose, Bld: 156 mg/dL — ABNORMAL HIGH (ref 65–99)
Potassium: 3.4 mmol/L — ABNORMAL LOW (ref 3.5–5.1)
Sodium: 138 mmol/L (ref 135–145)

## 2014-12-03 LAB — CBC
HCT: 39.5 % (ref 39.0–52.0)
Hemoglobin: 13.8 g/dL (ref 13.0–17.0)
MCH: 29.5 pg (ref 26.0–34.0)
MCHC: 34.9 g/dL (ref 30.0–36.0)
MCV: 84.4 fL (ref 78.0–100.0)
Platelets: 249 10*3/uL (ref 150–400)
RBC: 4.68 MIL/uL (ref 4.22–5.81)
RDW: 12.6 % (ref 11.5–15.5)
WBC: 8.3 10*3/uL (ref 4.0–10.5)

## 2014-12-03 LAB — GLUCOSE, CAPILLARY: Glucose-Capillary: 168 mg/dL — ABNORMAL HIGH (ref 65–99)

## 2014-12-03 MED ORDER — METOPROLOL TARTRATE 50 MG PO TABS
50.0000 mg | ORAL_TABLET | Freq: Two times a day (BID) | ORAL | Status: DC
  Filled 2014-12-03: qty 1

## 2014-12-03 MED ORDER — METOPROLOL TARTRATE 50 MG PO TABS: 50.0000 mg | ORAL_TABLET | Freq: Two times a day (BID) | ORAL | Status: DC

## 2014-12-03 MED ORDER — CLOPIDOGREL BISULFATE 75 MG PO TABS: 75.0000 mg | ORAL_TABLET | Freq: Every day | ORAL | Status: DC

## 2014-12-03 MED ORDER — ASPIRIN 81 MG PO CHEW: 81.0000 mg | CHEWABLE_TABLET | Freq: Every day | ORAL | Status: DC

## 2014-12-03 MED ORDER — METOPROLOL TARTRATE 25 MG PO TABS
25.0000 mg | ORAL_TABLET | Freq: Once | ORAL | Status: AC
  Administered 2014-12-03: 25 mg via ORAL

## 2014-12-03 MED ORDER — NITROGLYCERIN 0.4 MG SL SUBL
0.4000 mg | SUBLINGUAL_TABLET | SUBLINGUAL | Status: DC | PRN
Start: 2014-12-03 — End: 2015-03-13

## 2014-12-03 MED ORDER — ATORVASTATIN CALCIUM 80 MG PO TABS: 80.0000 mg | ORAL_TABLET | Freq: Every day | ORAL | Status: DC

## 2014-12-03 NOTE — Telephone Encounter (Signed)
New message    TCM appointment 8-3 with Ignacia Bayley

## 2014-12-03 NOTE — Progress Notes (Signed)
Went over discharge instructions with the patient. Wife and sister at bedside. Patient educated on appointments, new medications, and care following discharge. Patient also educated on diet and exercise. Patient very engaged in teaching. Expressed understanding of discharge instructions. Patient also given a note excuse for work.  IV discontinued, patient taken off cardiac monitor.   Patient discharged home.   Roxan Hockey, RN

## 2014-12-03 NOTE — Discharge Summary (Signed)
Discharge Summary   Patient ID: Hayden Williams,  MRN: [id number], DOB/AGE: [birth year] 59 y.o.  Admit date: 12/02/2014 Discharge date: 12/03/2014  Primary Care Provider: [place] Primary Cardiologist: C. McAlhany, MD   Discharge Diagnoses Principal Problem:   Acute coronary syndrome  **s/p PCI/DES to the distal RCA this admission.  **Normal LV function. Active Problems:   Unstable angina   CAD (coronary artery disease)   Type 2 diabetes mellitus, controlled   CKD (chronic kidney disease) stage 3, GFR 30-59 ml/min  Allergies No Known Allergies  Procedures  Cardiac Catheterization and Percutaneous Coronary Intervention 7.27.2016  Coronary Findings     Dominance: Right    Left Anterior Descending   . Prox LAD to Mid LAD lesion, 50% stenosed.   Jorene Minors LAD lesion, 60% stenosed.      Ramus Intermedius   . Ramus lesion, 40% stenosed.      Left Circumflex  The vessel is small .   Marland Kitchen First Obtuse Marginal Branch   The vessel is small in size.   . 1st Mrg lesion, 40% stenosed.   . Second Obtuse Marginal Branch   The vessel is small in size.   . 2nd Mrg lesion, 40% stenosed.      Right Coronary Artery   . Dist RCA lesion, 95% stenosed. discrete .   Marland Kitchen PCI: The distal RCA was successfully stented using a 2.25 x 20 mm Promus Premier DES.  Marland Kitchen There is no residual stenosis post intervention.     . Right Posterior Atrioventricular Branch   . Post Atrio lesion, 50% stenosed. discrete located at the bend .    Left Ventricle The left ventricular size is normal. The left ventricular systolic function is normal. There are no wall motion abnormalities in the left ventricle.   Mitral Valve - There is no mitral valve regurgitation. _____________   History of Present Illness  59 y/o male with a prior history of HTN, HL, and type II diabetes mellitus, followed closely at [facility].  He works for a [employer] as a Psychologist, counselling.  He walks  quite a bit at work and over the past month, he has been experiencing progressive exertional chest pain and dyspnea with increasing frequency.  He had recurrent symptoms on the morning of 7/26, and presented to the [facility] ED, where initial troponin was normal however, ECG showed inferior TWI.  He was admitted for further evaluation and management of acute coronary syndrome.  Hospital Course  Following admission, Mr. Elison was pain free.  Given symptoms and ECG changes, decision was made to pursue diagnostic catheterization.  This was performed on 7/26, revealing severe distal RCA disease with otherwise non-obstructive CAD and normal LV function.  The distal RCA was successfully stented using a2.25 x 20 mm Promus Premier DES.  Patient tolerated the procedure well and post-procedure has been ambulating without recurrent symptoms or limitations.  He will be discharged home today in good condition with early f/u arranged in the office for next week.  Discharge Vitals Blood pressure 142/88, pulse 80, temperature 98.5 F (36.9 C), temperature source Oral, resp. rate 14, height 5\' 11"  (1.803 m), weight 214 lb 15.2 oz (97.5 kg), SpO2 96 %.  Filed Weights   12/02/14 1140 12/02/14 1700  Weight: 224 lb (101.606 kg) 214 lb 15.2 oz (97.5 kg)    Labs  CBC  Recent Labs  12/02/14 1146 12/03/14 0240  WBC 6.0 8.3  HGB 14.1 13.8  HCT 39.7  39.5  MCV 85.4 84.4  PLT 242 161   Basic Metabolic Panel  Recent Labs  12/02/14 1146 12/03/14 0240  NA 140 138  K 3.8 3.4*  CL 108 109  CO2 23 24  GLUCOSE 198* 156*  BUN 22* 18  CREATININE 1.77* 1.70*  CALCIUM 8.3* 8.2*   Disposition  Pt is being discharged home today in good condition.  Follow-up Plans & Appointments  Follow-up Information    Follow up with Murray Hodgkins, NP On 12/10/2014.   Specialties:  Nurse Practitioner, Cardiology, Radiology   Why:  9:30 AM - Dr. Camillia Herter Nurse Practitioner   Contact information:   [address] [place] [address] [phone]       Discharge Medications    Medication List    STOP taking these medications        indomethacin 50 MG capsule  Commonly known as:  INDOCIN      TAKE these medications        amLODipine 5 MG tablet  Commonly known as:  NORVASC  Take 5 mg by mouth at bedtime.     aspirin 81 MG chewable tablet  Chew 1 tablet (81 mg total) by mouth daily.     atorvastatin 80 MG tablet  Commonly known as:  LIPITOR  Take 1 tablet (80 mg total) by mouth at bedtime.     clopidogrel 75 MG tablet  Commonly known as:  PLAVIX  Take 1 tablet (75 mg total) by mouth daily with breakfast.     colchicine 0.6 MG tablet  Take 2 pills, then take 1 an hour later.  Repeat as needed for gout attack     GOLD BOND FOOT Crea  Apply topically.     HUMALOG KWIKPEN 100 UNIT/ML KiwkPen  Generic drug:  insulin lispro  Inject 14 Units into the skin 3 (three) times daily.     losartan 50 MG tablet  Commonly known as:  COZAAR  Take 50 mg by mouth daily.     metoprolol 50 MG tablet  Commonly known as:  LOPRESSOR  Take 1 tablet (50 mg total) by mouth 2 (two) times daily.     nitroGLYCERIN 0.4 MG SL tablet  Commonly known as:  NITROSTAT  Place 1 tablet (0.4 mg total) under the tongue every 5 (five) minutes as needed for chest pain.     TOUJEO SOLOSTAR 300 UNIT/ML Sopn  Generic drug:  Insulin Glargine  Inject 45 Units into the skin daily.     VITAMIN A PO  Take by mouth.     Vitamin D 2000 UNITS Caps  Take 2,000 Units by mouth daily.        Outstanding Labs/Studies  F/u lipids/lft's in 8 wks.  Duration of Discharge Encounter   Greater than 30 minutes including physician time.  Signed, Murray Hodgkins NP 12/03/2014, 11:17 AM Patient seen and examined. I agree with the assessment and plan as detailed above. See also my additional thoughts below.   The patient is done very well and is ready to go home. I made the decision for him to go  home today. I agree with the note above in the plans.  Dola Argyle, MD, [facility] 12/03/2014 12:30 PM

## 2014-12-03 NOTE — Discharge Instructions (Signed)

## 2014-12-03 NOTE — Progress Notes (Signed)
Patient Name: Hayden Williams Date of Encounter: 12/03/2014   Principal Problem:   Unstable angina Active Problems:   CAD (coronary artery disease)   Type 2 diabetes mellitus, controlled   CKD (chronic kidney disease) stage 3, GFR 30-59 ml/min    SUBJECTIVE  No chest pain or dyspnea overnight.  Feels good. Eager to go home.  CURRENT MEDS . aspirin  81 mg Oral Daily  . atorvastatin  80 mg Oral q1800  . clopidogrel  75 mg Oral Q breakfast  . insulin glargine  45 Units Subcutaneous QHS  . losartan  50 mg Oral Daily  . metoprolol tartrate  25 mg Oral BID  . sodium chloride  3 mL Intravenous Q12H  . sodium chloride  3 mL Intravenous Q12H  . triamterene-hydrochlorothiazide  1 capsule Oral Daily    OBJECTIVE  Filed Vitals:   12/03/14 0600 12/03/14 0700 12/03/14 0719 12/03/14 0800  BP: 144/100 152/96 157/96 142/88  Pulse: 91 78 72 80  Temp:   98.5 F (36.9 C)   TempSrc:   Oral   Resp: 18 12 13 14   Height:      Weight:      SpO2: 99% 99% 96% 96%    Intake/Output Summary (Last 24 hours) at 12/03/14 1036 Last data filed at 12/02/14 2230  Gross per 24 hour  Intake 467.67 ml  Output   1850 ml  Net -1382.33 ml   Filed Weights   12/02/14 1140 12/02/14 1700  Weight: 224 lb (101.606 kg) 214 lb 15.2 oz (97.5 kg)    PHYSICAL EXAM  General: Pleasant, NAD. Neuro: Alert and oriented X 3. Moves all extremities spontaneously. Psych: Normal affect. HEENT:  Normal  Neck: Supple without bruits or JVD. Lungs:  Resp regular and unlabored, CTA. Heart: RRR no s3, s4, or murmurs. Abdomen: Soft, non-tender, non-distended, BS + x 4.  Extremities: No clubbing, cyanosis or edema. DP/PT/Radials 2+ and equal bilaterally.  R wrist cath site w/o bleeding, bruit, hematoma.  Accessory Clinical Findings  CBC  Recent Labs  12/02/14 1146 12/03/14 0240  WBC 6.0 8.3  HGB 14.1 13.8  HCT 39.7 39.5  MCV 85.4 84.4  PLT 242 341   Basic Metabolic Panel  Recent Labs  12/02/14 1146  12/03/14 0240  NA 140 138  K 3.8 3.4*  CL 108 109  CO2 23 24  GLUCOSE 198* 156*  BUN 22* 18  CREATININE 1.77* 1.70*  CALCIUM 8.3* 8.2*   TELE  rsr  ECG  Rsr, 80, inferior q's.  Radiology/Studies  Dg Chest 2 View  12/02/2014   CLINICAL DATA:  Chest pain for 1 month  EXAM: CHEST  2 VIEW  COMPARISON:  November 05, 2008  FINDINGS: The heart size and mediastinal contours are within normal limits. Both lungs are clear. The visualized skeletal structures are unremarkable.  IMPRESSION: No active cardiopulmonary disease.   Electronically Signed   By: Abelardo Diesel M.D.   On: 12/02/2014 12:49    ASSESSMENT AND PLAN  1.  USA/CAD:  S/p cath and PCI of the distal RCA on 7/26.  No chest pain overnight. Cardiac rehab to see this AM and ambulate.  Provided that he fells well with ambulation, can d/c this afternoon.  Cont asa, plavix, bb, statin, arb.    2.  Essential HTN:  BP elevated overnight.  Titrate bb to 50 bid.  He says pressures have been in 150's to 160's @ home for some time now.  Cont arb/diuretic.  Resume  home dose of amlodipine.  3.  HL:  Was prev on lipitor 40 @ home - followed by [facility]. Cont high potency statin in setting of USA/CAD/DM II.  4.  CKD III: Creat stable @ 1.70 this AM.  5.  Type II DM:  Cont SSI.  Plan to resume home regimen @ d/c.  Signed, Murray Hodgkins NP Patient seen and examined. I agree with the assessment and plan as detailed above. See also my additional thoughts below.   The patient has done very well. He presented with classic crescendo angina yesterday. He underwent cath and PCI and is going home today.  Dola Argyle, MD, [employer] [facility] 12/03/2014 11:20 AM

## 2014-12-03 NOTE — Care Management Note (Signed)
Case Management Note  Patient Details  Name: Hayden Williams MRN: [id number] Date of Birth: 1955/11/01  Subjective/Objective:       Adm w ch pain             Action/Plan: lives w wife   Expected Discharge Date:                  Expected Discharge Plan:  Home/Self Care  In-House Referral:     Discharge planning Services     Post Acute Care Choice:    Choice offered to:     DME Arranged:    DME Agency:     HH Arranged:    [place] Agency:     Status of Service:     Medicare Important Message Given:    Date Medicare IM Given:    Medicare IM give by:    Date Additional Medicare IM Given:    Additional Medicare Important Message give by:     If discussed at [place] of Stay Meetings, dates discussed:    Additional Comments: ur review done.  Lacretia Leigh, RN 12/03/2014, 8:47 AM

## 2014-12-03 NOTE — Progress Notes (Signed)
CARDIAC REHAB PHASE I   PRE:  Rate/Rhythm: 91 SR    BP: sitting 142/88    SaO2: 96 RA  MODE:  Ambulation: 700 ft   POST:  Rate/Rhythm: 108 ST    BP: sitting 194/107     SaO2:   Tolerated very well except BP (and HR) elevated. Received meds 30 min prior. Ed completed with great reception, family very attentive. Interested in [place] and will send referral to [place]. Understands importance of Plavix.  [id number]   Josephina Shih Larimore CES, ACSM 12/03/2014 11:49 AM

## 2014-12-04 NOTE — Telephone Encounter (Signed)
Attempted to call pt and voicemail picked up. Recording states that mailbox is full. Unable to leave VM. Will try again later.

## 2014-12-05 NOTE — Telephone Encounter (Signed)
TCM call.  Tried calling, VM answered but mailbox full. Unable to leave message. Will try later today.

## 2014-12-05 NOTE — Telephone Encounter (Signed)
Patient contacted regarding discharge from [facility] on 12/03/14.  Patient understands to follow up with provider Lucillie Garfinkel on 12/10/14 at 9:30 at [facility]. Patient understands discharge instructions? yes Patient understands medications and regiment? yes Patient understands to bring all medications to this visit? yes  Pt states he has been feeling good.  No CP.  States BP has been down.  This AM BP 139/89 HR 79.  Verbalizes understanding regarding medications. All questions answered. Will call if has any further questions.

## 2014-12-10 ENCOUNTER — Encounter: Payer: Self-pay | Admitting: Nurse Practitioner

## 2014-12-10 ENCOUNTER — Ambulatory Visit (INDEPENDENT_AMBULATORY_CARE_PROVIDER_SITE_OTHER): Payer: 59 | Admitting: Nurse Practitioner

## 2014-12-10 ENCOUNTER — Other Ambulatory Visit: Payer: Self-pay

## 2014-12-10 VITALS — BP 138/100 | HR 71 | Ht 71.0 in | Wt 218.0 lb

## 2014-12-10 DIAGNOSIS — N183 Chronic kidney disease, stage 3 unspecified: Secondary | ICD-10-CM

## 2014-12-10 DIAGNOSIS — E119 Type 2 diabetes mellitus without complications: Secondary | ICD-10-CM | POA: Diagnosis not present

## 2014-12-10 DIAGNOSIS — I10 Essential (primary) hypertension: Secondary | ICD-10-CM | POA: Insufficient documentation

## 2014-12-10 DIAGNOSIS — I2510 Atherosclerotic heart disease of native coronary artery without angina pectoris: Secondary | ICD-10-CM

## 2014-12-10 DIAGNOSIS — E785 Hyperlipidemia, unspecified: Secondary | ICD-10-CM

## 2014-12-10 MED ORDER — ATORVASTATIN CALCIUM 80 MG PO TABS: 80.0000 mg | ORAL_TABLET | Freq: Every day | ORAL | Status: DC

## 2014-12-10 MED ORDER — METOPROLOL TARTRATE 50 MG PO TABS: 50.0000 mg | ORAL_TABLET | Freq: Two times a day (BID) | ORAL | Status: DC

## 2014-12-10 MED ORDER — LOSARTAN POTASSIUM 100 MG PO TABS: 100.0000 mg | ORAL_TABLET | Freq: Every day | ORAL | Status: DC

## 2014-12-10 MED ORDER — AMLODIPINE BESYLATE 5 MG PO TABS: 5.0000 mg | ORAL_TABLET | Freq: Every day | ORAL | Status: DC

## 2014-12-10 MED ORDER — CLOPIDOGREL BISULFATE 75 MG PO TABS: 75.0000 mg | ORAL_TABLET | Freq: Every day | ORAL | Status: DC

## 2014-12-10 NOTE — Progress Notes (Signed)
Patient Name: Hayden Williams Date of Encounter: 12/10/2014  Primary Care Provider:  Velna Hatchet, MD Primary Cardiologist:  C. Angelena Form, MD   Chief Complaint   59 year old male status post recent admission for acute coronary syndrome and stenting of the RCA, who presents for follow-up.  Past Medical History   Past Medical History  Diagnosis Date  . Diabetes mellitus without complication   . CAD (coronary artery disease)     a. 11/2014 Cath/PCI: LM nl, LAD 50p, 60d, RI 40, LCX small, OM1 40, OM2 40, RCA 95d (2.25x20 Promus Premier DES), EF nl.  . CKD (chronic kidney disease), stage III   . Hyperlipidemia   . Essential hypertension    Past Surgical History  Procedure Laterality Date  . Eye surgery    . Cardiac catheterization N/A 12/02/2014    Procedure: Left Heart Cath and Coronary Angiography;  Surgeon: Burnell Blanks, MD;  Location: [facility];  Service: Cardiovascular;  Laterality: N/A;  . Cardiac catheterization N/A 12/02/2014    Procedure: Coronary Stent Intervention;  Surgeon: Burnell Blanks, MD;  Location: [facility];  Service: Cardiovascular;  Laterality: N/A;    Allergies  Allergies  Allergen Reactions  . Lisinopril     Other reaction(s): Other Increased bun and creatine    HPI   59 year old male with a prior history of diabetes, hypertension, hyperlipidemia, and stage III chronic kidney disease. He was recently admitted to [facility] secondary to progressive exertional chest pain and dyspnea. The ruled out for MI. He underwent diagnostic catheterization revealing severe distal RCA disease with otherwise nonobstructive disease. The RCA was treated with a Promus premier drug-eluting stent. He tolerated the procedure well and lab work was stable postprocedure. He was quickly discharged. Of note, his creatinine was stable throughout hospitalization at 1.7. Since discharge, he has been doing exceptionally well. He is walking some in his  house and has not had any recurrent chest pain or dyspnea. He denies PND, orthopnea, dizziness, syncope, edema, or early satiety. He is tolerating all medications well.  Home Medications  Prior to Admission medications   Medication Sig Start Date End Date Taking? Authorizing Provider  amLODipine (NORVASC) 5 MG tablet Take 1 tablet (5 mg total) by mouth at bedtime. 12/10/14  Yes Rogelia Mire, NP  aspirin 81 MG chewable tablet Chew 1 tablet (81 mg total) by mouth daily. 12/03/14  Yes Rogelia Mire, NP  atorvastatin (LIPITOR) 80 MG tablet Take 1 tablet (80 mg total) by mouth at bedtime. 12/10/14  Yes Rogelia Mire, NP  Cholecalciferol (VITAMIN D) 2000 UNITS CAPS Take 2,000 Units by mouth daily.   Yes Historical Provider, MD  clopidogrel (PLAVIX) 75 MG tablet Take 1 tablet (75 mg total) by mouth daily with breakfast. 12/10/14  Yes Rogelia Mire, NP  HUMALOG KWIKPEN 100 UNIT/ML KiwkPen Inject 14 Units into the skin 3 (three) times daily.  09/29/14  Yes Historical Provider, MD  losartan (COZAAR) 100 MG tablet Take 1 tablet (100 mg total) by mouth daily. 12/10/14  Yes Rogelia Mire, NP  metoprolol (LOPRESSOR) 50 MG tablet Take 1 tablet (50 mg total) by mouth 2 (two) times daily. 12/10/14  Yes Rogelia Mire, NP  MITIGARE 0.6 MG CAPS Take 0.06 mg by mouth 2 (two) times daily.  12/09/14  Yes Historical Provider, MD  nitroGLYCERIN (NITROSTAT) 0.4 MG SL tablet Place 1 tablet (0.4 mg total) under the tongue every 5 (five) minutes as needed for chest  pain. 12/03/14  Yes Rogelia Mire, NP  [employer]iatric Products (GOLD BOND FOOT) CREA Apply topically.   Yes Historical Provider, MD  TOUJEO SOLOSTAR 300 UNIT/ML SOPN Inject 45 Units into the skin daily.  11/12/14  Yes Historical Provider, MD  VITAMIN A PO Take by mouth.   Yes Historical Provider, MD    Review of Systems  Doing well as above.  He denies chest pain, palpitations, dyspnea, pnd, orthopnea, n, v, dizziness, syncope, edema, weight  gain, or early satiety.  All other systems reviewed and are otherwise negative except as noted above.  Physical Exam  VS:  BP 138/100 mmHg  Pulse 71  Ht 5\' 11"  (1.803 m)  Wt 218 lb (98.884 kg)  BMI 30.42 kg/m2 , BMI Body mass index is 30.42 kg/(m^2). GEN: Well nourished, well developed, in no acute distress. HEENT: normal. Neck: Supple, no JVD, carotid bruits, or masses. Cardiac: RRR, no murmurs, rubs, or gallops. No clubbing, cyanosis, edema.  Radials/DP/PT 2+ and equal bilaterally.  Right wrist catheterization site without bleeding, bruit, or hematoma. Respiratory:  Respirations regular and unlabored, clear to auscultation bilaterally. GI: Soft, nontender, nondistended, BS + x 4. MS: no deformity or atrophy. Skin: warm and dry, no rash. Neuro:  Strength and sensation are intact. Psych: Normal affect.  Accessory Clinical Findings  ECG -  Regular sinus rhythm, 71 , inferior infarct with inferior T-wave inversion. No acute ST or T changes.  Assessment & Plan  1.   Coronary artery disease: patient is status post recent admission for acute coronary syndrome. He underwent successful PCI and drug eluting stent placement of the right coronary artery. He had a relatively uneventful postprocedure course. Since discharge, he has done well without recurrence of symptoms. He is looking forward to joining cardiac rehabilitation in a couple of weeks. He remains on aspirin, statin, Plavix, beta blocker, and ARB therapy. He also has when necessary nitrates if needed.   2. Essential hypertension: Blood pressure has been elevated at home in the 140s to 150s. I will increase his losartan to 100 mg daily. We will arrange for follow-up basic metabolic panel.   3. Hyperlipidemia: Continue high potency statin therapy. Follow-up lipids and LFTs in approximately 8 weeks.   4. Stage III chronic kidney disease: Creatinine was stable throughout hospital position. Follow-up basic metabolic panel to ensure  stability.   5. Insulin-dependent diabetes mellitus: This is followed closely by his primary care provider.   6. Overweight: patient is looking forward to cardiac rehabilitation and is looking to lose weight.   7. Disposition: follow-up basic  metabolic panel. Follow-up with LFTs in approximately 8 weeks. Follow-up with Dr. Angelena Form in 3 mos or sooner if necessary.  Murray Hodgkins, NP 12/10/2014, 1:19 PM

## 2014-12-10 NOTE — Patient Instructions (Signed)
Medication Instructions:  Your physician has recommended you make the following change in your medication:  INCREASE Cozaar to 100mg  daily  90 day refills of your cardiac medications have been sent to your pharmacy   Labwork: Your physician recommends that you return for a FASTING lipid profile and lft in 6 weeks   Testing/Procedures: None   Follow-Up: Your physician recommends that you schedule a follow-up appointment in: 3 months with Dr.Mcalhany   Any Other Special Instructions Will Be Listed Below (If Applicable).

## 2014-12-12 ENCOUNTER — Encounter: Payer: Self-pay | Admitting: Cardiovascular Disease

## 2014-12-12 ENCOUNTER — Telehealth: Payer: Self-pay | Admitting: Nurse Practitioner

## 2014-12-12 ENCOUNTER — Other Ambulatory Visit (INDEPENDENT_AMBULATORY_CARE_PROVIDER_SITE_OTHER): Payer: 59 | Admitting: *Deleted

## 2014-12-12 DIAGNOSIS — I10 Essential (primary) hypertension: Secondary | ICD-10-CM

## 2014-12-12 LAB — BASIC METABOLIC PANEL
BUN: 20 mg/dL (ref 6–23)
CO2: 27 mEq/L (ref 19–32)
Calcium: 8.3 mg/dL — ABNORMAL LOW (ref 8.4–10.5)
Chloride: 108 mEq/L (ref 96–112)
Creatinine, Ser: 1.73 mg/dL — ABNORMAL HIGH (ref 0.40–1.50)
GFR: 52.25 mL/min — ABNORMAL LOW (ref >60.00)
Glucose, Bld: 124 mg/dL — ABNORMAL HIGH (ref 70–99)
Potassium: 3.4 mEq/L — ABNORMAL LOW (ref 3.5–5.1)
Sodium: 142 mEq/L (ref 135–145)

## 2014-12-12 NOTE — Telephone Encounter (Signed)
Called to inform patient that letter has been written.  Unable to leave message due to patient's mailbox is full

## 2014-12-12 NOTE — Telephone Encounter (Signed)
Spoke with patient and discussed his symptoms from last night when he went into his work place.  Patient states he feels fine today.  I advised him that he may notice a change in his physical conditioning related to recent stent but that he should progress activity as directed by his cardiologist and call us if he experiences symptoms that are concerning to him.  I advised him to seek immediate medical attention if he experiences these symptoms during the night.  The patient has NTG and I advised him to take these as directed if he has chest discomfort.  He verbalized understanding and agreement with plan of care and states he will come to the office to pick up the work letter.

## 2014-12-12 NOTE — Telephone Encounter (Signed)
Received walk-in form from front [facility], Registration Personnel, from patient who requests clearance for work.  He states he was told he could return to work on Sunday night at [employer] for 3rd shift.  He states he went by the workplace last night to visit and he became nauseated and SOB while visiting.  He would like to make certain he is cleared to return Sunday night.  I have reviewed patient's chart and do not find documentation of return to work note.  I am routing message to Dr. Angelena Form for clearance.

## 2014-12-12 NOTE — Telephone Encounter (Signed)
Letter written. cdm 

## 2015-01-01 ENCOUNTER — Encounter (HOSPITAL_COMMUNITY)
Admission: RE | Admit: 2015-01-01 | Discharge: 2015-01-01 | Disposition: A | Discharge status: Home / Self Care | Payer: 59 | Source: Ambulatory Visit | Attending: Cardiovascular Disease | Admitting: Cardiovascular Disease

## 2015-01-01 DIAGNOSIS — Z955 Presence of coronary angioplasty implant and graft: Secondary | ICD-10-CM | POA: Insufficient documentation

## 2015-01-01 DIAGNOSIS — Z48812 Encounter for surgical aftercare following surgery on the circulatory system: Secondary | ICD-10-CM | POA: Insufficient documentation

## 2015-01-01 NOTE — Progress Notes (Signed)
Cardiac Rehab Medication Review by a Pharmacist  Does the patient  feel that his/her medications are working for him/her?  yes  Has the patient been experiencing any side effects to the medications prescribed?  no  Does the patient measure his/her own blood pressure or blood glucose at home?  yes   Does the patient have any problems obtaining medications due to transportation or finances?   no  Understanding of regimen: excellent Understanding of indications: excellent Potential of compliance: excellent    Pharmacist comments: Hayden Williams does very well with his medications. He has a pocket card he always keeps with him at all times with a list of his medications. The only problem he is having is that his Toujeo pen has been almost running out every month due to an increase in dose by his provider. The pharmacy still has his old dosing and therefore thinks he has a greater supply than he actually does. I encouraged him to follow up with his PCP to ask them to resend a new prescription with the correct instructions.  Dimitri Ped, PharmD. Clinical Pharmacist Resident Pager: [phone]

## 2015-01-05 ENCOUNTER — Encounter (HOSPITAL_COMMUNITY)
Admission: RE | Admit: 2015-01-05 | Discharge: 2015-01-05 | Disposition: A | Discharge status: Home / Self Care | Payer: 59 | Source: Ambulatory Visit | Attending: Cardiovascular Disease | Admitting: Cardiovascular Disease

## 2015-01-05 ENCOUNTER — Encounter (HOSPITAL_COMMUNITY): Admission: RE | Admit: 2015-01-05 | Payer: 59 | Source: Ambulatory Visit

## 2015-01-05 ENCOUNTER — Encounter (HOSPITAL_COMMUNITY): Payer: Self-pay

## 2015-01-05 DIAGNOSIS — Z48812 Encounter for surgical aftercare following surgery on the circulatory system: Secondary | ICD-10-CM | POA: Diagnosis not present

## 2015-01-05 DIAGNOSIS — Z955 Presence of coronary angioplasty implant and graft: Secondary | ICD-10-CM | POA: Diagnosis not present

## 2015-01-05 LAB — GLUCOSE, CAPILLARY
Glucose-Capillary: 170 mg/dL — ABNORMAL HIGH (ref 65–99)
Glucose-Capillary: 210 mg/dL — ABNORMAL HIGH (ref 65–99)

## 2015-01-05 NOTE — Progress Notes (Signed)
Pt started cardiac rehab today.  Pt tolerated light exercise without difficulty. VSS, telemetry-Sinus rhythm, asymptomatic.  Medication list reconciled.  Pt verbalized compliance with medications and denies barriers to compliance. PSYCHOSOCIAL ASSESSMENT:  PHQ-0. Pt exhibits positive coping skills, hopeful outlook with supportive family. No psychosocial needs identified at this time, no psychosocial interventions necessary.   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    Pt cardiac rehab  goal is  to lose weight and increase strength.  Pt encouraged to participate in nutrition and education classes  to increase ability to achieve these goals.  Pt also encouraged to participate in home exercise on off days from cardiac rehab.  Pt oriented to exercise equipment and routine.  Understanding verbalized.

## 2015-01-07 ENCOUNTER — Encounter (HOSPITAL_COMMUNITY)
Admission: RE | Admit: 2015-01-07 | Discharge: 2015-01-07 | Disposition: A | Discharge status: Home / Self Care | Payer: 59 | Source: Ambulatory Visit | Attending: Cardiovascular Disease | Admitting: Cardiovascular Disease

## 2015-01-07 DIAGNOSIS — Z48812 Encounter for surgical aftercare following surgery on the circulatory system: Secondary | ICD-10-CM | POA: Diagnosis not present

## 2015-01-07 LAB — GLUCOSE, CAPILLARY
Glucose-Capillary: 111 mg/dL — ABNORMAL HIGH (ref 65–99)
Glucose-Capillary: 91 mg/dL (ref 65–99)

## 2015-01-09 ENCOUNTER — Encounter (HOSPITAL_COMMUNITY)
Admission: RE | Admit: 2015-01-09 | Discharge: 2015-01-09 | Disposition: A | Discharge status: Home / Self Care | Payer: 59 | Source: Ambulatory Visit | Attending: Cardiovascular Disease | Admitting: Cardiovascular Disease

## 2015-01-09 DIAGNOSIS — Z955 Presence of coronary angioplasty implant and graft: Secondary | ICD-10-CM | POA: Insufficient documentation

## 2015-01-09 DIAGNOSIS — Z48812 Encounter for surgical aftercare following surgery on the circulatory system: Secondary | ICD-10-CM | POA: Diagnosis not present

## 2015-01-09 LAB — GLUCOSE, CAPILLARY
Glucose-Capillary: 110 mg/dL — ABNORMAL HIGH (ref 65–99)
Glucose-Capillary: 103 mg/dL — ABNORMAL HIGH (ref 65–99)
Glucose-Capillary: 45 mg/dL — ABNORMAL LOW (ref 65–99)

## 2015-01-09 NOTE — Progress Notes (Signed)
Pt post exercise CBG-45. Pt c/o diaphoresis and weakness, otherwise asymptomatic.  Pt given glucose gel and gingerale.  15 minute recheck CBG-111. Pt reports relief of his symptoms.   Pt offered additional snack however declined stating he plans to eat immediately upon arrival home. Pt also reports his schedule was different today since he did not work last night. He took his toujeo last night at bedtime and only ate cheerios for breakfast prior to exercise. Typically pt works nights so takes is toujeo after exercising later in the morning and eats a large meal around 4am then a snack prior to exercise. Pt instructed to need to eat more complex carbohydrates and protein prior to exercise on his off days.  Understanding verbalized.

## 2015-01-13 MED FILL — Glucose Gel 40%: ORAL | Qty: 1 | Status: AC

## 2015-01-14 ENCOUNTER — Encounter (HOSPITAL_COMMUNITY)
Admission: RE | Admit: 2015-01-14 | Discharge: 2015-01-14 | Disposition: A | Discharge status: Home / Self Care | Payer: 59 | Source: Ambulatory Visit | Attending: Cardiovascular Disease | Admitting: Cardiovascular Disease

## 2015-01-14 DIAGNOSIS — Z48812 Encounter for surgical aftercare following surgery on the circulatory system: Secondary | ICD-10-CM | POA: Diagnosis not present

## 2015-01-14 LAB — GLUCOSE, CAPILLARY
Glucose-Capillary: 185 mg/dL — ABNORMAL HIGH (ref 65–99)
Glucose-Capillary: 105 mg/dL — ABNORMAL HIGH (ref 65–99)

## 2015-01-16 ENCOUNTER — Encounter (HOSPITAL_COMMUNITY)
Admission: RE | Admit: 2015-01-16 | Discharge: 2015-01-16 | Disposition: A | Discharge status: Home / Self Care | Payer: 59 | Source: Ambulatory Visit | Attending: Cardiovascular Disease | Admitting: Cardiovascular Disease

## 2015-01-16 DIAGNOSIS — Z48812 Encounter for surgical aftercare following surgery on the circulatory system: Secondary | ICD-10-CM | POA: Diagnosis not present

## 2015-01-16 LAB — GLUCOSE, CAPILLARY
Glucose-Capillary: 107 mg/dL — ABNORMAL HIGH (ref 65–99)
Glucose-Capillary: 117 mg/dL — ABNORMAL HIGH (ref 65–99)

## 2015-01-16 NOTE — Progress Notes (Signed)
Nutrition Note Spoke with pt. Pt pre-exercise CBG 107 mg/dL. Pt reports taking 14 units of Humalog and eating Raisin bran cereal and milk around 2:30 am. Pt checked his CBG at home before coming to exercise and CBG was 150 mg/dL per pt. Pt is allergic to peanuts so pt was given lemonade before exercising. The importance of protein, healthy fat, and carbs before exercise discussed. Pt typically eats a meal at 4 am due to working third shift. Pt encouraged to eat a light snack 1-1.5 hr before exercise. Pt agreed to eat either a 1/2 [place] and cheese sandwich or tuna salad with whole grain crackers approximately 1 hour before exercising. Pt expressed understanding of the information reviewed. Continue client-centered nutrition education by RD as part of interdisciplinary care.  Monitor and evaluate progress toward nutrition goal with team.  Derek Mound, M.Ed, RD, LDN, CDE 01/16/2015 9:23 AM

## 2015-01-19 ENCOUNTER — Encounter (HOSPITAL_COMMUNITY)
Admission: RE | Admit: 2015-01-19 | Discharge: 2015-01-19 | Disposition: A | Discharge status: Home / Self Care | Payer: 59 | Source: Ambulatory Visit | Attending: Cardiovascular Disease | Admitting: Cardiovascular Disease

## 2015-01-19 DIAGNOSIS — Z48812 Encounter for surgical aftercare following surgery on the circulatory system: Secondary | ICD-10-CM | POA: Diagnosis not present

## 2015-01-19 LAB — GLUCOSE, CAPILLARY
Glucose-Capillary: 101 mg/dL — ABNORMAL HIGH (ref 65–99)
Glucose-Capillary: 116 mg/dL — ABNORMAL HIGH (ref 65–99)

## 2015-01-21 ENCOUNTER — Other Ambulatory Visit: Payer: 59

## 2015-01-21 ENCOUNTER — Encounter (HOSPITAL_COMMUNITY)
Admission: RE | Admit: 2015-01-21 | Discharge: 2015-01-21 | Disposition: A | Discharge status: Home / Self Care | Payer: 59 | Source: Ambulatory Visit | Attending: Cardiovascular Disease | Admitting: Cardiovascular Disease

## 2015-01-21 DIAGNOSIS — Z48812 Encounter for surgical aftercare following surgery on the circulatory system: Secondary | ICD-10-CM | POA: Diagnosis not present

## 2015-01-21 LAB — GLUCOSE, CAPILLARY
Glucose-Capillary: 96 mg/dL (ref 65–99)
Glucose-Capillary: 111 mg/dL — ABNORMAL HIGH (ref 65–99)
Glucose-Capillary: 65 mg/dL (ref 65–99)

## 2015-01-21 NOTE — Progress Notes (Signed)
Pt CBG-96 upon arrival to cardiac rehab.  Pt works nights and ate at 5am. Pt given lemonade pre exercise. Pt post exercise CBG-65.  Pt asymptomatic.  Pt given gingerale and banana.  15 minute recheck CBG-111.  Pt plans to eat meal upon arrival home. Pt given diabetes meal and snack planning for exercise handout.  Pt instructed to eat snack prior to exercise. Understanding verbalized.   Quality of Life Assessment:   Overall pt quality of life scores are excellent. Pt only 2 main concerns are his overall health related to his recent cardiac event.  Pt is thankful he is now asymptomatic post stenting and knows warning s/s and when to notify MD.  Pt verbalizes he is encouraged to be participating in cardiac rehab and is eager to learn what he safely can do.  Pt is presently exercising on his own using home treadmill and going to [employer].  Secondly pt does have financial burden from previous unemployment, recession and new job with salary cut.  However pt demonstrates good coping skills with positive outlook.  Pt does have supportive family.  Pt offered emotional support and reassurance. Will continue to monitor.

## 2015-01-23 ENCOUNTER — Encounter (HOSPITAL_COMMUNITY)
Admission: RE | Admit: 2015-01-23 | Discharge: 2015-01-23 | Disposition: A | Discharge status: Home / Self Care | Payer: 59 | Source: Ambulatory Visit | Attending: Cardiovascular Disease | Admitting: Cardiovascular Disease

## 2015-01-23 DIAGNOSIS — Z48812 Encounter for surgical aftercare following surgery on the circulatory system: Secondary | ICD-10-CM | POA: Diagnosis not present

## 2015-01-23 LAB — GLUCOSE, CAPILLARY: Glucose-Capillary: 90 mg/dL (ref 65–99)

## 2015-01-26 ENCOUNTER — Encounter (HOSPITAL_COMMUNITY)
Admission: RE | Admit: 2015-01-26 | Discharge: 2015-01-26 | Disposition: A | Discharge status: Home / Self Care | Payer: 59 | Source: Ambulatory Visit | Attending: Cardiovascular Disease | Admitting: Cardiovascular Disease

## 2015-01-26 DIAGNOSIS — Z48812 Encounter for surgical aftercare following surgery on the circulatory system: Secondary | ICD-10-CM | POA: Diagnosis not present

## 2015-01-26 LAB — GLUCOSE, CAPILLARY
Glucose-Capillary: 152 mg/dL — ABNORMAL HIGH (ref 65–99)
Glucose-Capillary: 83 mg/dL (ref 65–99)
Glucose-Capillary: 88 mg/dL (ref 65–99)

## 2015-01-28 ENCOUNTER — Encounter (HOSPITAL_COMMUNITY)
Admission: RE | Admit: 2015-01-28 | Discharge: 2015-01-28 | Disposition: A | Discharge status: Home / Self Care | Payer: 59 | Source: Ambulatory Visit | Attending: Cardiovascular Disease | Admitting: Cardiovascular Disease

## 2015-01-28 DIAGNOSIS — Z48812 Encounter for surgical aftercare following surgery on the circulatory system: Secondary | ICD-10-CM | POA: Diagnosis not present

## 2015-01-28 LAB — GLUCOSE, CAPILLARY: Glucose-Capillary: 86 mg/dL (ref 65–99)

## 2015-01-30 ENCOUNTER — Telehealth (HOSPITAL_COMMUNITY): Payer: Self-pay | Admitting: Cardiac Rehabilitation

## 2015-01-30 ENCOUNTER — Encounter (HOSPITAL_COMMUNITY)
Admission: RE | Admit: 2015-01-30 | Discharge: 2015-01-30 | Disposition: A | Discharge status: Home / Self Care | Payer: 59 | Source: Ambulatory Visit | Attending: Cardiovascular Disease | Admitting: Cardiovascular Disease

## 2015-01-30 DIAGNOSIS — Z48812 Encounter for surgical aftercare following surgery on the circulatory system: Secondary | ICD-10-CM | POA: Diagnosis not present

## 2015-01-30 LAB — GLUCOSE, CAPILLARY
Glucose-Capillary: 262 mg/dL — ABNORMAL HIGH (ref 65–99)
Glucose-Capillary: 109 mg/dL — ABNORMAL HIGH (ref 65–99)

## 2015-01-30 NOTE — Telephone Encounter (Signed)
-----   Message from Burnell Blanks, MD sent at 01/30/2015  2:11 PM EDT ----- Regarding: RE: cardiac rehab Thanks. Gerald Stabs  ----- Message -----    From: Lowell Guitar, RN    Sent: 01/30/2015  12:26 PM      To: Burnell Blanks, MD Subject: cardiac rehab                                  Dear Dr. Angelena Form,  Pt has been hypertensive this week at cardiac rehab.  He was 146/100 today at rest pre-exercise.  Peak BP with exercise today 160/100, 180/98 on Monday. Lowest BP:  138/74 post exercise. Pt knows he may have taken his BP med a few hours late yesterday and ate barbecue over the weekend.   I have faxed his rehab report to you for review.  Please advise.   Thank you, Andi Hence, RN, BSN Cardiac Pulmonary Rehab

## 2015-02-02 ENCOUNTER — Encounter (HOSPITAL_COMMUNITY)
Admission: RE | Admit: 2015-02-02 | Discharge: 2015-02-02 | Disposition: A | Discharge status: Home / Self Care | Payer: 59 | Source: Ambulatory Visit | Attending: Cardiovascular Disease | Admitting: Cardiovascular Disease

## 2015-02-02 DIAGNOSIS — Z48812 Encounter for surgical aftercare following surgery on the circulatory system: Secondary | ICD-10-CM | POA: Diagnosis not present

## 2015-02-02 LAB — GLUCOSE, CAPILLARY
Glucose-Capillary: 219 mg/dL — ABNORMAL HIGH (ref 65–99)
Glucose-Capillary: 161 mg/dL — ABNORMAL HIGH (ref 65–99)

## 2015-02-02 NOTE — Progress Notes (Signed)
Hayden Williams 59 y.o. male Nutrition Note Spoke with pt. Nutrition Plan and Nutrition Survey goals reviewed with pt. Pt is following Step 2 of the Therapeutic Lifestyle Changes diet. Pt is diabetic. No recent A1c noted. Pt states his next A1c check is 02/20/15. This Probation officer went over Diabetes Education test results. Pt checks CBG's 2-3 times a day. CBG's reportedly 90-120 mg/dL. Pt rarely adds salt to food. Pt expressed understanding of the information reviewed. Pt aware of nutrition education classes offered. No results found for: HGBA1C  Nutrition Diagnosis ? Food-and nutrition-related knowledge deficit related to lack of exposure to information as related to diagnosis of: ? CVD ? DM  ? Obesity related to excessive energy intake as evidenced by a BMI of 31.8  Nutrition RX/ Estimated Daily Nutrition Needs for: wt loss 1750-2250 Kcal, 45-60 gm fat, 11-15 gm sat fat, 1.7-2.2 gm trans-fat, <1500 mg sodium, 250 gm CHO   Nutrition Intervention ? Pt's individual nutrition plan reviewed with pt. ? Benefits of adopting Therapeutic Lifestyle Changes discussed when Medficts reviewed. ? Pt to attend the Portion Distortion class ? Pt to attend the Diabetes Q & A class - met; 01/16/15 ? Pt to attend the   ? Nutrition I class                  ? Nutrition II class     ? Diabetes Blitz class ? Pt given handouts for: ? Nutrition I class ? Nutrition II class ? Diabetes Blitz Class  ? Continue client-centered nutrition education by RD, as part of interdisciplinary care. Goal(s) ? Pt to identify food quantities necessary to achieve: ? wt loss to a goal wt of 197-215 lb (89.7-97.9 kg) at graduation from cardiac rehab.  ? Pt to describe the benefit of including fruits, vegetables, whole grains, and low-fat dairy products in a heart healthy meal plan. ? Use pre-meal and post-meal CBG's and A1c to determine whether adjustments in food/meal planning will be beneficial or if any meds need to be combined with  nutrition therapy. Monitor and Evaluate progress toward nutrition goal with team. Nutrition Risk: Change to Moderate Derek Mound, M.Ed, RD, LDN, CDE 02/02/2015 9:02 AM

## 2015-02-04 ENCOUNTER — Telehealth: Payer: Self-pay | Admitting: Cardiovascular Disease

## 2015-02-04 ENCOUNTER — Encounter (HOSPITAL_COMMUNITY)
Admission: RE | Admit: 2015-02-04 | Discharge: 2015-02-04 | Disposition: A | Discharge status: Home / Self Care | Payer: 59 | Source: Ambulatory Visit | Attending: Cardiovascular Disease | Admitting: Cardiovascular Disease

## 2015-02-04 DIAGNOSIS — Z48812 Encounter for surgical aftercare following surgery on the circulatory system: Secondary | ICD-10-CM | POA: Diagnosis not present

## 2015-02-04 LAB — GLUCOSE, CAPILLARY
Glucose-Capillary: 49 mg/dL — ABNORMAL LOW (ref 65–99)
Glucose-Capillary: 88 mg/dL (ref 65–99)

## 2015-02-04 NOTE — Telephone Encounter (Signed)
I called and spoke with Mechele Claude at Cardiac Rehab. She reports that over the last week, the patient's DBP has been running 90-100's at rest and with exercise. He will run ~ 140/100 with exercise. Resting BP today was 134/84. The patient does not take his BP at home. He works nights, so he takes metoprolol tartrate at 10:30 am & 10:30 pm, he takes amlodipine & losartan at 10:30 am, before he goes to bed. He denies headaches and tolerates exercise well. I advised Mechele Claude that when they are monitoring his BP at Cardiac Rehab, we are not really seeing effects of amlodipine & losartan, only the PM dose of metoprolol. I advised her I would forward to Dr. Angelena Form for review. I advised her I was uncertain if he would make any changes to meds based on the fact that his readings are technically off his medications (except metoprolol). The patient has a follow up appointment on 03/13/15 with Dr. Angelena Form.

## 2015-02-04 NOTE — Telephone Encounter (Signed)
Patient works nights and was leaving Cardiac Rehab to go home and sleep.  Will need to try and reach the patient later this after noon or in the AM.

## 2015-02-04 NOTE — Telephone Encounter (Signed)
Does he have a cuff at home so we could get some readings from the am and some from the pm? I would not make any changes now unless he is having problems. Thanks, chris

## 2015-02-04 NOTE — Telephone Encounter (Signed)
New message   Pt c/o BP issue: STAT if pt c/o blurred vision, one-sided weakness or slurred speech  1. What are your last 5 BP readings? 9/28 160/100 with exercise at cardiac rehab today / resting 138/84 .   2. Are you having any other symptoms (ex. Dizziness, headache, blurred vision, passed out)? No   3. What is your BP issue? Call cardiac rehab back to discuss

## 2015-02-04 NOTE — Progress Notes (Addendum)
Pt hypoglycemic post exercise at cardiac rehab, CBG-49.  Pt asymptomatic.  Pt reported CBG -223 at 5:30am.  Pt ate meal and took insulin at 6:30 am.  Pt then ate muffin on way to rehab.  Dr. Ardeth Perfect notified.  Per Danae Chen, Dr. Ardeth Perfect advised pt take 1/2 dose meal time insulin prior to exercise.  Pt notified via phone, understanding verbalized.  Pt also hypertensive at cardiac rehab.  PC to Dr. Camillia Herter office to notify.  Spoke to [place], triage nurse, who will review with Dr. Angelena Form.  Rehab report faxed for Dr. Angelena Form to review.  Pt plans to check BP at home daily.

## 2015-02-05 MED FILL — Glucose Gel 40%: ORAL | Qty: 1 | Status: AC

## 2015-02-05 NOTE — Telephone Encounter (Signed)
Unable to leave message. Mailbox full.

## 2015-02-05 NOTE — Telephone Encounter (Signed)
I placed call to pt but was unable to leave message as mailbox was full.

## 2015-02-06 ENCOUNTER — Encounter (HOSPITAL_COMMUNITY)
Admission: RE | Admit: 2015-02-06 | Discharge: 2015-02-06 | Disposition: A | Discharge status: Home / Self Care | Payer: 59 | Source: Ambulatory Visit | Attending: Cardiovascular Disease | Admitting: Cardiovascular Disease

## 2015-02-06 DIAGNOSIS — Z48812 Encounter for surgical aftercare following surgery on the circulatory system: Secondary | ICD-10-CM | POA: Diagnosis not present

## 2015-02-06 LAB — GLUCOSE, CAPILLARY
Glucose-Capillary: 102 mg/dL — ABNORMAL HIGH (ref 65–99)
Glucose-Capillary: 129 mg/dL — ABNORMAL HIGH (ref 65–99)

## 2015-02-06 NOTE — Telephone Encounter (Signed)
I was unable to reach pt but he was given the instructions from Dr. Angelena Form by RN  at Cardiac rehab today to check his BP at home in the morning and in the evening.

## 2015-02-06 NOTE — Progress Notes (Signed)
Pt advised per Dr. Angelena Form recommendation to check daily BP at home.  Pt will check in the morning and at night while at work, keeping BP log and will take this information with him to upcoming office appointment.    These times of day will give BP information during medication peak times.   Pt verbalized understanding and agreed to do this.

## 2015-02-09 ENCOUNTER — Encounter (HOSPITAL_COMMUNITY): Payer: 59

## 2015-02-11 ENCOUNTER — Encounter (HOSPITAL_COMMUNITY)
Admission: RE | Admit: 2015-02-11 | Discharge: 2015-02-11 | Disposition: A | Discharge status: Home / Self Care | Payer: 59 | Source: Ambulatory Visit | Attending: Cardiovascular Disease | Admitting: Cardiovascular Disease

## 2015-02-11 DIAGNOSIS — Z48812 Encounter for surgical aftercare following surgery on the circulatory system: Secondary | ICD-10-CM | POA: Insufficient documentation

## 2015-02-11 DIAGNOSIS — Z955 Presence of coronary angioplasty implant and graft: Secondary | ICD-10-CM | POA: Insufficient documentation

## 2015-02-11 LAB — GLUCOSE, CAPILLARY
Glucose-Capillary: 104 mg/dL — ABNORMAL HIGH (ref 65–99)
Glucose-Capillary: 118 mg/dL — ABNORMAL HIGH (ref 65–99)
Glucose-Capillary: 79 mg/dL (ref 65–99)

## 2015-02-11 NOTE — Progress Notes (Signed)
Pt CBG-79 upon arrival to cardiac rehab today. Pt asymptomatic. Pt reports he ate beef stew and fruit around 6:30am and took 1/2 dose humalog.  Pt also reports he was very physically active at work last night with steady walking and stair climbing.  Pt given banana and lemonade.  15 minute recheck:  104. Pt able to exercise and tolerated without difficulty.  Post exercise CBG:  118.  Pt instructed to eat fruit in car on way to cardiac rehab to ensure adequate exercise CBG.  Cardiac rehab diabetic guidelines reviewed with patient.  Understanding verbalized.

## 2015-02-13 ENCOUNTER — Encounter (HOSPITAL_COMMUNITY)
Admission: RE | Admit: 2015-02-13 | Discharge: 2015-02-13 | Disposition: A | Discharge status: Home / Self Care | Payer: 59 | Source: Ambulatory Visit | Attending: Cardiovascular Disease | Admitting: Cardiovascular Disease

## 2015-02-13 DIAGNOSIS — Z48812 Encounter for surgical aftercare following surgery on the circulatory system: Secondary | ICD-10-CM | POA: Diagnosis not present

## 2015-02-13 LAB — GLUCOSE, CAPILLARY
Glucose-Capillary: 286 mg/dL — ABNORMAL HIGH (ref 65–99)
Glucose-Capillary: 138 mg/dL — ABNORMAL HIGH (ref 65–99)

## 2015-02-16 ENCOUNTER — Encounter (HOSPITAL_COMMUNITY)
Admission: RE | Admit: 2015-02-16 | Discharge: 2015-02-16 | Disposition: A | Discharge status: Home / Self Care | Payer: 59 | Source: Ambulatory Visit | Attending: Cardiovascular Disease | Admitting: Cardiovascular Disease

## 2015-02-16 DIAGNOSIS — Z48812 Encounter for surgical aftercare following surgery on the circulatory system: Secondary | ICD-10-CM | POA: Diagnosis not present

## 2015-02-16 LAB — GLUCOSE, CAPILLARY
Glucose-Capillary: 166 mg/dL — ABNORMAL HIGH (ref 65–99)
Glucose-Capillary: 76 mg/dL (ref 65–99)
Glucose-Capillary: 121 mg/dL — ABNORMAL HIGH (ref 65–99)

## 2015-02-18 ENCOUNTER — Encounter (HOSPITAL_COMMUNITY)
Admission: RE | Admit: 2015-02-18 | Discharge: 2015-02-18 | Disposition: A | Discharge status: Home / Self Care | Payer: 59 | Source: Ambulatory Visit | Attending: Cardiovascular Disease | Admitting: Cardiovascular Disease

## 2015-02-18 DIAGNOSIS — Z48812 Encounter for surgical aftercare following surgery on the circulatory system: Secondary | ICD-10-CM | POA: Diagnosis not present

## 2015-02-18 LAB — GLUCOSE, CAPILLARY
Glucose-Capillary: 187 mg/dL — ABNORMAL HIGH (ref 65–99)
Glucose-Capillary: 98 mg/dL (ref 65–99)

## 2015-02-20 ENCOUNTER — Encounter (HOSPITAL_COMMUNITY)
Admission: RE | Admit: 2015-02-20 | Discharge: 2015-02-20 | Disposition: A | Discharge status: Home / Self Care | Payer: 59 | Source: Ambulatory Visit | Attending: Cardiovascular Disease | Admitting: Cardiovascular Disease

## 2015-02-20 DIAGNOSIS — Z48812 Encounter for surgical aftercare following surgery on the circulatory system: Secondary | ICD-10-CM | POA: Diagnosis not present

## 2015-02-20 LAB — GLUCOSE, CAPILLARY
Glucose-Capillary: 251 mg/dL — ABNORMAL HIGH (ref 65–99)
Glucose-Capillary: 126 mg/dL — ABNORMAL HIGH (ref 65–99)

## 2015-02-23 ENCOUNTER — Encounter (HOSPITAL_COMMUNITY): Payer: 59

## 2015-02-25 ENCOUNTER — Encounter (HOSPITAL_COMMUNITY)
Admission: RE | Admit: 2015-02-25 | Discharge: 2015-02-25 | Disposition: A | Discharge status: Home / Self Care | Payer: 59 | Source: Ambulatory Visit | Attending: Cardiovascular Disease | Admitting: Cardiovascular Disease

## 2015-02-25 DIAGNOSIS — Z48812 Encounter for surgical aftercare following surgery on the circulatory system: Secondary | ICD-10-CM | POA: Diagnosis not present

## 2015-02-25 LAB — GLUCOSE, CAPILLARY
Glucose-Capillary: 148 mg/dL — ABNORMAL HIGH (ref 65–99)
Glucose-Capillary: 204 mg/dL — ABNORMAL HIGH (ref 65–99)

## 2015-02-25 NOTE — Progress Notes (Addendum)
60 psychosocial assessment  No psychosocial needs identified, no intervention necessary. Pt is exercising on his own at home.pt states he feels much better and feels stronger.  Pt exercise somewhat limited by hypertension.  Dr. Angelena Form and Dr. Ardeth Perfect made aware. Per pt report, medication adjustments were made by Dr. Ardeth Perfect.  Pt is unsure name of medication and will bring information with him to next scheduled cardiac rehab appointment.  Will continue to monitor.

## 2015-02-27 ENCOUNTER — Encounter (HOSPITAL_COMMUNITY)
Admission: RE | Admit: 2015-02-27 | Discharge: 2015-02-27 | Disposition: A | Discharge status: Home / Self Care | Payer: 59 | Source: Ambulatory Visit | Attending: Cardiovascular Disease | Admitting: Cardiovascular Disease

## 2015-02-27 DIAGNOSIS — Z48812 Encounter for surgical aftercare following surgery on the circulatory system: Secondary | ICD-10-CM | POA: Diagnosis not present

## 2015-02-27 LAB — GLUCOSE, CAPILLARY
Glucose-Capillary: 278 mg/dL — ABNORMAL HIGH (ref 65–99)
Glucose-Capillary: 145 mg/dL — ABNORMAL HIGH (ref 65–99)

## 2015-02-27 NOTE — Progress Notes (Signed)
Pt return to exercise today.  Pt reports the change in his bp medication.  Amlodipine was increased to 10 mg daily.  Pt took his first dose on Thursday at 11:30.  Pt concerned regarding the long-term effects this may have on his kidneys.  Pt indicated that he wanted Dr. Angelena Form to assist in improved management of his blood pressures.  Pt has upcoming appt with him on 11/4.    Pt requested bp to be sent to Dr. Angelena Form for review.  Pt given educational handout regarding blood pressure management.  Pt appreciated of this and verbalized understanding. Cherre Huger, BSN

## 2015-03-02 ENCOUNTER — Encounter (HOSPITAL_COMMUNITY)
Admission: RE | Admit: 2015-03-02 | Discharge: 2015-03-02 | Disposition: A | Discharge status: Home / Self Care | Payer: 59 | Source: Ambulatory Visit | Attending: Cardiovascular Disease | Admitting: Cardiovascular Disease

## 2015-03-02 DIAGNOSIS — Z48812 Encounter for surgical aftercare following surgery on the circulatory system: Secondary | ICD-10-CM | POA: Diagnosis not present

## 2015-03-02 LAB — GLUCOSE, CAPILLARY
Glucose-Capillary: 175 mg/dL — ABNORMAL HIGH (ref 65–99)
Glucose-Capillary: 98 mg/dL (ref 65–99)

## 2015-03-04 ENCOUNTER — Encounter (HOSPITAL_COMMUNITY)
Admission: RE | Admit: 2015-03-04 | Discharge: 2015-03-04 | Disposition: A | Discharge status: Home / Self Care | Payer: 59 | Source: Ambulatory Visit | Attending: Cardiovascular Disease | Admitting: Cardiovascular Disease

## 2015-03-04 DIAGNOSIS — Z48812 Encounter for surgical aftercare following surgery on the circulatory system: Secondary | ICD-10-CM | POA: Diagnosis not present

## 2015-03-04 LAB — GLUCOSE, CAPILLARY
Glucose-Capillary: 152 mg/dL — ABNORMAL HIGH (ref 65–99)
Glucose-Capillary: 183 mg/dL — ABNORMAL HIGH (ref 65–99)

## 2015-03-06 ENCOUNTER — Encounter (HOSPITAL_COMMUNITY)
Admission: RE | Admit: 2015-03-06 | Discharge: 2015-03-06 | Disposition: A | Discharge status: Home / Self Care | Payer: 59 | Source: Ambulatory Visit | Attending: Cardiovascular Disease | Admitting: Cardiovascular Disease

## 2015-03-06 DIAGNOSIS — Z48812 Encounter for surgical aftercare following surgery on the circulatory system: Secondary | ICD-10-CM | POA: Diagnosis not present

## 2015-03-06 LAB — GLUCOSE, CAPILLARY
Glucose-Capillary: 82 mg/dL (ref 65–99)
Glucose-Capillary: 99 mg/dL (ref 65–99)

## 2015-03-09 ENCOUNTER — Encounter (HOSPITAL_COMMUNITY)
Admission: RE | Admit: 2015-03-09 | Discharge: 2015-03-09 | Disposition: A | Discharge status: Home / Self Care | Payer: 59 | Source: Ambulatory Visit | Attending: Cardiovascular Disease | Admitting: Cardiovascular Disease

## 2015-03-09 DIAGNOSIS — Z48812 Encounter for surgical aftercare following surgery on the circulatory system: Secondary | ICD-10-CM | POA: Diagnosis not present

## 2015-03-09 LAB — GLUCOSE, CAPILLARY
Glucose-Capillary: 206 mg/dL — ABNORMAL HIGH (ref 65–99)
Glucose-Capillary: 168 mg/dL — ABNORMAL HIGH (ref 65–99)

## 2015-03-11 ENCOUNTER — Encounter (HOSPITAL_COMMUNITY)
Admission: RE | Admit: 2015-03-11 | Discharge: 2015-03-11 | Disposition: A | Discharge status: Home / Self Care | Payer: 59 | Source: Ambulatory Visit | Attending: Cardiovascular Disease | Admitting: Cardiovascular Disease

## 2015-03-11 DIAGNOSIS — Z48812 Encounter for surgical aftercare following surgery on the circulatory system: Secondary | ICD-10-CM | POA: Insufficient documentation

## 2015-03-11 DIAGNOSIS — Z955 Presence of coronary angioplasty implant and graft: Secondary | ICD-10-CM | POA: Diagnosis not present

## 2015-03-11 LAB — GLUCOSE, CAPILLARY: Glucose-Capillary: 177 mg/dL — ABNORMAL HIGH (ref 65–99)

## 2015-03-12 NOTE — Progress Notes (Deleted)
HPI    Review of Systems        Physical Exam

## 2015-03-12 NOTE — Progress Notes (Signed)
Chief Complaint  Patient presents with  . Hypertension     History of Present Illness: 59 year old male with a history of CAD, diabetes, hypertension, hyperlipidemia, and stage III chronic kidney disease who is here today for cardiac follow up. He was admitted to [facility] July 2016 secondary to progressive exertional chest pain and dyspnea and underwent cardiac cath showing severe distal RCA disease with otherwise nonobstructive disease. The RCA was treated with a Promus premier drug-eluting stent. He tolerated the procedure well and lab work was stable postprocedure.    He is here today for follow up. He has not had any recurrent chest pain or dyspnea. He denies PND, orthopnea, dizziness, syncope, edema, or early satiety. He is tolerating all medications well.   Primary Care Physician: Velna Hatchet, MD   Past Medical History  Diagnosis Date  . Diabetes mellitus without complication ([place])   . CAD (coronary artery disease)     a. 11/2014 Cath/PCI: LM nl, LAD 50p, 60d, RI 40, LCX small, OM1 40, OM2 40, RCA 95d (2.25x20 Promus Premier DES), EF nl.  . CKD (chronic kidney disease), stage III   . Hyperlipidemia   . Essential hypertension     Past Surgical History  Procedure Laterality Date  . Eye surgery    . Cardiac catheterization N/A 12/02/2014    Procedure: Left Heart Cath and Coronary Angiography;  Surgeon: Burnell Blanks, MD;  Location: [place] [facility];  Service: Cardiovascular;  Laterality: N/A;  . Cardiac catheterization N/A 12/02/2014    Procedure: Coronary Stent Intervention;  Surgeon: Burnell Blanks, MD;  Location: [place] [facility];  Service: Cardiovascular;  Laterality: N/A;    Current Outpatient Prescriptions  Medication Sig Dispense Refill  . amLODipine (NORVASC) 10 MG tablet Take 10 mg by mouth daily.  3  . aspirin 81 MG chewable tablet Chew 1 tablet (81 mg total) by mouth daily.    Marland Kitchen atorvastatin (LIPITOR) 80 MG tablet Take 1 tablet (80 mg  total) by mouth at bedtime. 90 tablet 3  . Cholecalciferol (VITAMIN D) 2000 UNITS CAPS Take 2,000 Units by mouth daily.    . clopidogrel (PLAVIX) 75 MG tablet Take 1 tablet (75 mg total) by mouth daily with breakfast. 30 tablet 6  . HUMALOG KWIKPEN 100 UNIT/ML KiwkPen Inject 14 Units into the skin 3 (three) times daily. 12 units AM, 14 units lunch, 14 units dinner  11  . ketoconazole (NIZORAL) 2 % cream Apply 1 application topically as needed for irritation. Apply to R foot several times a week    . losartan (COZAAR) 100 MG tablet Take 1 tablet (100 mg total) by mouth daily. 90 tablet 3  . metoprolol (LOPRESSOR) 100 MG tablet Take 1 tablet (100 mg total) by mouth 2 (two) times daily. 180 tablet 3  . MITIGARE 0.6 MG CAPS Take 1 capsule by mouth as needed (for gout flare).     . neomycin-bacitracin-polymyxin (NEOSPORIN) ointment Apply 1 application topically as needed for wound care (R foot). apply to eye    . nitroGLYCERIN (NITROSTAT) 0.4 MG SL tablet Place 0.4 mg under the tongue every 5 (five) minutes as needed for chest pain (x 3 tabs daily).    . Podiatric Products (GOLD BOND FOOT) CREA Apply 1 application topically daily as needed (cracked feet).     Nelva Nay SOLOSTAR 300 UNIT/ML SOPN Inject 45 Units into the skin daily.   1  . VITAMIN A PO Take 10,000 Units by mouth daily.  No current facility-administered medications for this visit.    Allergies  Allergen Reactions  . Peanut-Containing Drug Products Anaphylaxis  . Lisinopril     Other reaction(s): Other Increased bun and creatine    Social History   Social History  . Marital Status: Married    Spouse Name: N/A  . Number of Children: N/A  . Years of Education: N/A   Occupational History  . Not on file.   Social History Main Topics  . Smoking status: Never Smoker   . Smokeless tobacco: Never Used  . Alcohol Use: No  . Drug Use: No  . Sexual Activity: Not on file   Other Topics Concern  . Not on file   Social  History Narrative    Family History  Problem Relation Age of Onset  . Diabetes Mother   . Heart disease Mother   . Cancer Father   . Diabetes Sister     Review of Systems:  As stated in the HPI and otherwise negative.   BP 144/96 mmHg  Pulse 86  Ht 5\' 10"  (1.778 m)  Wt 226 lb (102.513 kg)  BMI 32.43 kg/m2  Physical Examination: General: Well developed, well nourished, NAD HEENT: OP clear, mucus membranes moist SKIN: warm, dry. No rashes. Neuro: No focal deficits Musculoskeletal: Muscle strength 5/5 all ext Psychiatric: Mood and affect normal Neck: No JVD, no carotid bruits, no thyromegaly, no lymphadenopathy. Lungs:Clear bilaterally, no wheezes, rhonci, crackles Cardiovascular: Regular rate and rhythm. No murmurs, gallops or rubs. Abdomen:Soft. Bowel sounds present. Non-tender.  Extremities: No lower extremity edema. Pulses are 2 + in the bilateral DP/PT.  EKG:  EKG is not ordered today. The ekg ordered today demonstrates   Recent Labs: 12/03/2014: Hemoglobin 13.8; Platelets 249 12/12/2014: BUN 20; Creatinine, Ser 1.73*; Potassium 3.4*; Sodium 142   Lipid Panel No results found for: CHOL, TRIG, HDL, CHOLHDL, VLDL, LDLCALC, LDLDIRECT   Wt Readings from Last 3 Encounters:  03/13/15 226 lb (102.513 kg)  01/01/15 221 lb 12.5 oz (100.6 kg)  12/10/14 218 lb (98.884 kg)     Other studies Reviewed: Additional studies/ records that were reviewed today include: . Review of the above records demonstrates:    Assessment and Plan:   1. Coronary artery disease: Stable. Recent PCI of the RCA with DES placement. He remains on aspirin, statin, Plavix, beta blocker, and ARB therapy.   2. Essential hypertension: BP elevated. Will increase Lopressor 100 mg BID. Continue Norvasc and Cozaar. If BP not controlled in several weeks, he will call back and we will add Hydralazine.    3. Hyperlipidemia: Continue high potency statin therapy. Lipids followed in primary care.   4.  Stage III chronic kidney disease: Creatinine stable. Followed in primary care.   5. Insulin-dependent diabetes mellitus: This is followed closely by his primary care provider.  Current medicines are reviewed at length with the patient today.  The patient does not have concerns regarding medicines.  The following changes have been made:  no change  Labs/ tests ordered today include:  No orders of the defined types were placed in this encounter.    Disposition:   FU with me in 6 months  Signed, Lauree Chandler, MD 03/13/2015 9:48 AM    [address] [address], [place],   [address] Phone: [phone]; Fax: [phone]

## 2015-03-13 ENCOUNTER — Encounter: Payer: Self-pay | Admitting: Cardiovascular Disease

## 2015-03-13 ENCOUNTER — Ambulatory Visit (INDEPENDENT_AMBULATORY_CARE_PROVIDER_SITE_OTHER): Payer: 59 | Admitting: Cardiovascular Disease

## 2015-03-13 ENCOUNTER — Encounter (HOSPITAL_COMMUNITY): Payer: 59

## 2015-03-13 VITALS — BP 144/96 | HR 86 | Ht 70.0 in | Wt 226.0 lb

## 2015-03-13 DIAGNOSIS — E785 Hyperlipidemia, unspecified: Secondary | ICD-10-CM | POA: Diagnosis not present

## 2015-03-13 DIAGNOSIS — I2510 Atherosclerotic heart disease of native coronary artery without angina pectoris: Secondary | ICD-10-CM | POA: Diagnosis not present

## 2015-03-13 DIAGNOSIS — I10 Essential (primary) hypertension: Secondary | ICD-10-CM

## 2015-03-13 MED ORDER — METOPROLOL TARTRATE 100 MG PO TABS: 100.0000 mg | ORAL_TABLET | Freq: Two times a day (BID) | ORAL | Status: DC

## 2015-03-13 NOTE — Patient Instructions (Addendum)
Medication Instructions:  Your physician has recommended you make the following change in your medication: Increase Lopressor to 100 mg by mouth twice daily.      Labwork: none  Testing/Procedures: none  Follow-Up: Your physician wants you to follow-up in: 6 months.  You will receive a reminder letter in the mail two months in advance. If you don't receive a letter, please call our office to schedule the follow-up appointment.   Any Other Special Instructions Will Be Listed Below (If Applicable).     If you need a refill on your cardiac medications before your next appointment, please call your pharmacy.

## 2015-03-16 ENCOUNTER — Encounter (HOSPITAL_COMMUNITY)
Admission: RE | Admit: 2015-03-16 | Discharge: 2015-03-16 | Disposition: A | Discharge status: Home / Self Care | Payer: 59 | Source: Ambulatory Visit | Attending: Cardiovascular Disease | Admitting: Cardiovascular Disease

## 2015-03-16 DIAGNOSIS — Z48812 Encounter for surgical aftercare following surgery on the circulatory system: Secondary | ICD-10-CM | POA: Diagnosis not present

## 2015-03-16 LAB — GLUCOSE, CAPILLARY
Glucose-Capillary: 147 mg/dL — ABNORMAL HIGH (ref 65–99)
Glucose-Capillary: 133 mg/dL — ABNORMAL HIGH (ref 65–99)

## 2015-03-18 ENCOUNTER — Encounter (HOSPITAL_COMMUNITY)
Admission: RE | Admit: 2015-03-18 | Discharge: 2015-03-18 | Disposition: A | Discharge status: Home / Self Care | Payer: 59 | Source: Ambulatory Visit | Attending: Cardiovascular Disease | Admitting: Cardiovascular Disease

## 2015-03-18 ENCOUNTER — Encounter: Payer: Self-pay | Admitting: Cardiovascular Disease

## 2015-03-18 DIAGNOSIS — Z48812 Encounter for surgical aftercare following surgery on the circulatory system: Secondary | ICD-10-CM | POA: Diagnosis not present

## 2015-03-18 LAB — GLUCOSE, CAPILLARY
Glucose-Capillary: 243 mg/dL — ABNORMAL HIGH (ref 65–99)
Glucose-Capillary: 148 mg/dL — ABNORMAL HIGH (ref 65–99)

## 2015-03-20 ENCOUNTER — Encounter (HOSPITAL_COMMUNITY)
Admission: RE | Admit: 2015-03-20 | Discharge: 2015-03-20 | Disposition: A | Discharge status: Home / Self Care | Payer: 59 | Source: Ambulatory Visit | Attending: Cardiovascular Disease | Admitting: Cardiovascular Disease

## 2015-03-20 DIAGNOSIS — Z48812 Encounter for surgical aftercare following surgery on the circulatory system: Secondary | ICD-10-CM | POA: Diagnosis not present

## 2015-03-20 LAB — GLUCOSE, CAPILLARY
Glucose-Capillary: 107 mg/dL — ABNORMAL HIGH (ref 65–99)
Glucose-Capillary: 114 mg/dL — ABNORMAL HIGH (ref 65–99)

## 2015-03-20 NOTE — Progress Notes (Signed)
Pt weight up 2 kg in 2 days at cardiac rehab. Pt c/o trace pedal edema which is relieved when feet elevated.  Pt denies dyspnea or bloating.  Pt lungs clear, trace lower extremity edema noted up to calf.  O2 sat-100%.   Pt admits to eating out 2 days ago. Although he attempted to make heart healthy choices the food was flavored with added salt.  Pt advised to follow strict low sodium diet and keep feet elevated when sitting over the weekend.  Understanding verbalized.

## 2015-03-23 ENCOUNTER — Encounter (HOSPITAL_COMMUNITY)
Admission: RE | Admit: 2015-03-23 | Discharge: 2015-03-23 | Disposition: A | Discharge status: Home / Self Care | Payer: 59 | Source: Ambulatory Visit | Attending: Cardiovascular Disease | Admitting: Cardiovascular Disease

## 2015-03-23 DIAGNOSIS — Z48812 Encounter for surgical aftercare following surgery on the circulatory system: Secondary | ICD-10-CM | POA: Diagnosis not present

## 2015-03-23 LAB — GLUCOSE, CAPILLARY: Glucose-Capillary: 144 mg/dL — ABNORMAL HIGH (ref 65–99)

## 2015-03-24 LAB — GLUCOSE, CAPILLARY: Glucose-Capillary: 217 mg/dL — ABNORMAL HIGH (ref 65–99)

## 2015-03-25 ENCOUNTER — Encounter (HOSPITAL_COMMUNITY): Admission: RE | Admit: 2015-03-25 | Payer: 59 | Source: Ambulatory Visit

## 2015-03-27 ENCOUNTER — Encounter (HOSPITAL_COMMUNITY)
Admission: RE | Admit: 2015-03-27 | Discharge: 2015-03-27 | Disposition: A | Discharge status: Home / Self Care | Payer: 59 | Source: Ambulatory Visit | Attending: Cardiovascular Disease | Admitting: Cardiovascular Disease

## 2015-03-27 DIAGNOSIS — Z48812 Encounter for surgical aftercare following surgery on the circulatory system: Secondary | ICD-10-CM | POA: Diagnosis not present

## 2015-03-27 LAB — GLUCOSE, CAPILLARY
Glucose-Capillary: 74 mg/dL (ref 65–99)
Glucose-Capillary: 100 mg/dL — ABNORMAL HIGH (ref 65–99)
Glucose-Capillary: 96 mg/dL (ref 65–99)

## 2015-03-30 ENCOUNTER — Encounter (HOSPITAL_COMMUNITY)
Admission: RE | Admit: 2015-03-30 | Discharge: 2015-03-30 | Disposition: A | Discharge status: Home / Self Care | Payer: 59 | Source: Ambulatory Visit | Attending: Cardiovascular Disease | Admitting: Cardiovascular Disease

## 2015-03-30 DIAGNOSIS — Z48812 Encounter for surgical aftercare following surgery on the circulatory system: Secondary | ICD-10-CM | POA: Diagnosis not present

## 2015-03-30 LAB — GLUCOSE, CAPILLARY: Glucose-Capillary: 142 mg/dL — ABNORMAL HIGH (ref 65–99)

## 2015-04-01 ENCOUNTER — Encounter (HOSPITAL_COMMUNITY): Payer: 59

## 2015-04-06 ENCOUNTER — Encounter (HOSPITAL_COMMUNITY)
Admission: RE | Admit: 2015-04-06 | Discharge: 2015-04-06 | Disposition: A | Discharge status: Home / Self Care | Payer: 59 | Source: Ambulatory Visit | Attending: Cardiovascular Disease | Admitting: Cardiovascular Disease

## 2015-04-06 DIAGNOSIS — Z48812 Encounter for surgical aftercare following surgery on the circulatory system: Secondary | ICD-10-CM | POA: Diagnosis not present

## 2015-04-06 LAB — GLUCOSE, CAPILLARY: Glucose-Capillary: 95 mg/dL (ref 65–99)

## 2015-04-08 ENCOUNTER — Encounter (HOSPITAL_COMMUNITY)
Admission: RE | Admit: 2015-04-08 | Discharge: 2015-04-08 | Disposition: A | Discharge status: Home / Self Care | Payer: 59 | Source: Ambulatory Visit | Attending: Cardiovascular Disease | Admitting: Cardiovascular Disease

## 2015-04-08 DIAGNOSIS — Z48812 Encounter for surgical aftercare following surgery on the circulatory system: Secondary | ICD-10-CM | POA: Diagnosis not present

## 2015-04-08 LAB — GLUCOSE, CAPILLARY
Glucose-Capillary: 177 mg/dL — ABNORMAL HIGH (ref 65–99)
Glucose-Capillary: 110 mg/dL — ABNORMAL HIGH (ref 65–99)

## 2015-04-10 ENCOUNTER — Encounter (HOSPITAL_COMMUNITY)
Admission: RE | Admit: 2015-04-10 | Discharge: 2015-04-10 | Disposition: A | Discharge status: Home / Self Care | Payer: 59 | Source: Ambulatory Visit | Attending: Cardiovascular Disease | Admitting: Cardiovascular Disease

## 2015-04-10 DIAGNOSIS — Z48812 Encounter for surgical aftercare following surgery on the circulatory system: Secondary | ICD-10-CM | POA: Insufficient documentation

## 2015-04-10 DIAGNOSIS — Z955 Presence of coronary angioplasty implant and graft: Secondary | ICD-10-CM | POA: Insufficient documentation

## 2015-04-10 LAB — GLUCOSE, CAPILLARY
Glucose-Capillary: 145 mg/dL — ABNORMAL HIGH (ref 65–99)
Glucose-Capillary: 107 mg/dL — ABNORMAL HIGH (ref 65–99)
Glucose-Capillary: 57 mg/dL — ABNORMAL LOW (ref 65–99)

## 2015-04-13 ENCOUNTER — Encounter (HOSPITAL_COMMUNITY): Payer: Self-pay

## 2015-04-13 ENCOUNTER — Encounter (HOSPITAL_COMMUNITY)
Admission: RE | Admit: 2015-04-13 | Discharge: 2015-04-13 | Disposition: A | Discharge status: Home / Self Care | Payer: 59 | Source: Ambulatory Visit | Attending: Cardiovascular Disease | Admitting: Cardiovascular Disease

## 2015-04-13 DIAGNOSIS — Z48812 Encounter for surgical aftercare following surgery on the circulatory system: Secondary | ICD-10-CM | POA: Diagnosis not present

## 2015-04-13 LAB — GLUCOSE, CAPILLARY: Glucose-Capillary: 146 mg/dL — ABNORMAL HIGH (ref 65–99)

## 2015-04-13 NOTE — Progress Notes (Signed)
Pt graduated from cardiac rehab program today with completion of 36 exercise sessions in Phase II. Pt maintained good attendance and progressed nicely during his participation in rehab as evidenced by increased MET level.   Medication list reconciled. Repeat  PHQ score-0.  Pt Quality of Life scores significantly improved.  Pt has improved outlook and perspective on his life situations.  Pt is more assured about his physical abilities and has been able to return to enjoyable activities such as motorcycle riding.  Pt is also doing more around the house. Pt has made significant lifestyle changes and has been commended for his success. Pt blood pressure has improved with medication adjustments. Pt is aware of normal blood pressure readings and feels comfortable discussing BP readings with MD.  Pt blood sugar readings are improved with medication and dietary adjustments, in addition changing his work schedule to evening shift has helped with diet and medication regimen.  Pt feels he has achieved his goals during cardiac rehab,  With the exception of weight loss.  Pt plans to continue working on his weight loss goals.   Pt plans to continue exercise in cardiac maintenance program.

## 2015-04-15 MED FILL — Glucose Gel 40%: ORAL | Qty: 1 | Status: AC

## 2015-04-20 ENCOUNTER — Encounter (HOSPITAL_COMMUNITY)
Admission: RE | Admit: 2015-04-20 | Discharge: 2015-04-20 | Disposition: A | Discharge status: Home / Self Care | Payer: Self-pay | Source: Ambulatory Visit | Attending: Cardiovascular Disease | Admitting: Cardiovascular Disease

## 2015-04-20 DIAGNOSIS — Z955 Presence of coronary angioplasty implant and graft: Secondary | ICD-10-CM | POA: Insufficient documentation

## 2015-04-21 ENCOUNTER — Encounter (HOSPITAL_COMMUNITY)
Admission: RE | Admit: 2015-04-21 | Discharge: 2015-04-21 | Disposition: A | Discharge status: Home / Self Care | Payer: Self-pay | Source: Ambulatory Visit | Attending: Cardiovascular Disease | Admitting: Cardiovascular Disease

## 2015-04-23 ENCOUNTER — Encounter (HOSPITAL_COMMUNITY): Payer: Self-pay

## 2015-04-27 ENCOUNTER — Encounter (HOSPITAL_COMMUNITY)
Admission: RE | Admit: 2015-04-27 | Discharge: 2015-04-27 | Disposition: A | Discharge status: Home / Self Care | Payer: Self-pay | Source: Ambulatory Visit | Attending: Cardiovascular Disease | Admitting: Cardiovascular Disease

## 2015-04-28 ENCOUNTER — Encounter (HOSPITAL_COMMUNITY): Payer: Self-pay

## 2015-04-30 ENCOUNTER — Encounter (HOSPITAL_COMMUNITY)
Admission: RE | Admit: 2015-04-30 | Discharge: 2015-04-30 | Disposition: A | Discharge status: Home / Self Care | Payer: Self-pay | Source: Ambulatory Visit | Attending: Cardiovascular Disease | Admitting: Cardiovascular Disease

## 2015-05-05 ENCOUNTER — Encounter (HOSPITAL_COMMUNITY): Payer: Self-pay

## 2015-05-07 ENCOUNTER — Encounter (HOSPITAL_COMMUNITY): Payer: Self-pay

## 2015-05-12 ENCOUNTER — Encounter (HOSPITAL_COMMUNITY)
Admission: RE | Admit: 2015-05-12 | Discharge: 2015-05-12 | Disposition: A | Discharge status: Home / Self Care | Payer: Self-pay | Source: Ambulatory Visit | Attending: Cardiovascular Disease | Admitting: Cardiovascular Disease

## 2015-05-12 DIAGNOSIS — Z955 Presence of coronary angioplasty implant and graft: Secondary | ICD-10-CM | POA: Insufficient documentation

## 2015-05-14 ENCOUNTER — Encounter (HOSPITAL_COMMUNITY): Payer: Self-pay

## 2015-05-18 ENCOUNTER — Encounter (HOSPITAL_COMMUNITY)
Admission: RE | Admit: 2015-05-18 | Discharge: 2015-05-18 | Disposition: A | Discharge status: Home / Self Care | Payer: Self-pay | Source: Ambulatory Visit | Attending: Cardiovascular Disease | Admitting: Cardiovascular Disease

## 2015-05-19 ENCOUNTER — Encounter (HOSPITAL_COMMUNITY): Payer: Self-pay

## 2015-05-21 ENCOUNTER — Encounter (HOSPITAL_COMMUNITY): Payer: Self-pay

## 2015-05-25 ENCOUNTER — Encounter (HOSPITAL_COMMUNITY): Payer: Self-pay

## 2015-05-26 ENCOUNTER — Encounter (HOSPITAL_COMMUNITY): Payer: Self-pay

## 2015-05-28 ENCOUNTER — Encounter (HOSPITAL_COMMUNITY)
Admission: RE | Admit: 2015-05-28 | Discharge: 2015-05-28 | Disposition: A | Discharge status: Home / Self Care | Payer: Self-pay | Source: Ambulatory Visit | Attending: Cardiovascular Disease | Admitting: Cardiovascular Disease

## 2015-06-01 ENCOUNTER — Encounter (HOSPITAL_COMMUNITY)
Admission: RE | Admit: 2015-06-01 | Discharge: 2015-06-01 | Disposition: A | Discharge status: Home / Self Care | Payer: Self-pay | Source: Ambulatory Visit | Attending: Cardiovascular Disease | Admitting: Cardiovascular Disease

## 2015-06-02 ENCOUNTER — Encounter (HOSPITAL_COMMUNITY): Payer: Self-pay

## 2015-06-04 ENCOUNTER — Encounter (HOSPITAL_COMMUNITY): Payer: Self-pay

## 2015-06-08 ENCOUNTER — Encounter (HOSPITAL_COMMUNITY)
Admission: RE | Admit: 2015-06-08 | Discharge: 2015-06-08 | Disposition: A | Discharge status: Home / Self Care | Payer: Self-pay | Source: Ambulatory Visit | Attending: Cardiovascular Disease | Admitting: Cardiovascular Disease

## 2015-06-09 ENCOUNTER — Encounter (HOSPITAL_COMMUNITY): Payer: Self-pay

## 2015-06-11 ENCOUNTER — Encounter (HOSPITAL_COMMUNITY): Payer: 59

## 2015-06-11 DIAGNOSIS — Z955 Presence of coronary angioplasty implant and graft: Secondary | ICD-10-CM | POA: Insufficient documentation

## 2015-06-15 ENCOUNTER — Encounter (HOSPITAL_COMMUNITY): Payer: Self-pay

## 2015-06-16 ENCOUNTER — Encounter (HOSPITAL_COMMUNITY): Payer: Self-pay

## 2015-06-18 ENCOUNTER — Encounter (HOSPITAL_COMMUNITY): Payer: Self-pay

## 2015-06-22 ENCOUNTER — Encounter (HOSPITAL_COMMUNITY)
Admission: RE | Admit: 2015-06-22 | Discharge: 2015-06-22 | Disposition: A | Discharge status: Home / Self Care | Payer: Self-pay | Source: Ambulatory Visit | Attending: Cardiovascular Disease | Admitting: Cardiovascular Disease

## 2015-06-23 ENCOUNTER — Encounter (HOSPITAL_COMMUNITY): Payer: Self-pay

## 2015-06-25 ENCOUNTER — Encounter (HOSPITAL_COMMUNITY): Payer: Self-pay

## 2015-06-29 ENCOUNTER — Telehealth: Payer: Self-pay | Admitting: Cardiovascular Disease

## 2015-06-29 ENCOUNTER — Encounter (HOSPITAL_COMMUNITY): Payer: Self-pay

## 2015-06-29 NOTE — Telephone Encounter (Signed)
Agree. Thanks, chris 

## 2015-06-29 NOTE — Telephone Encounter (Signed)
Spoke with pt. On February 10,2017 he had indigestion type feeling. Took one NTG with immediate relief of pain. No episodes since. Attending cardiac rehab and exercising without problems.  I told pt no changes needed at this time since it was a one time occurrence.  I instructed him to let us know if frequency of NTG use increases.

## 2015-06-29 NOTE — Telephone Encounter (Signed)
New message   Pt had chest discomfort 06/19/15 and he took a nitroglycerine and the pain went away he wants Rn to call him

## 2015-06-30 ENCOUNTER — Encounter (HOSPITAL_COMMUNITY)
Admission: RE | Admit: 2015-06-30 | Discharge: 2015-06-30 | Disposition: A | Discharge status: Home / Self Care | Payer: Self-pay | Source: Ambulatory Visit | Attending: Cardiovascular Disease | Admitting: Cardiovascular Disease

## 2015-07-02 ENCOUNTER — Encounter (HOSPITAL_COMMUNITY): Payer: Self-pay

## 2015-07-06 ENCOUNTER — Encounter (HOSPITAL_COMMUNITY): Payer: Self-pay

## 2015-07-07 ENCOUNTER — Encounter (HOSPITAL_COMMUNITY): Payer: Self-pay

## 2015-07-08 ENCOUNTER — Other Ambulatory Visit: Payer: Self-pay | Admitting: Nurse Practitioner

## 2015-07-09 ENCOUNTER — Encounter (HOSPITAL_COMMUNITY): Payer: Self-pay

## 2015-07-09 DIAGNOSIS — Z955 Presence of coronary angioplasty implant and graft: Secondary | ICD-10-CM | POA: Insufficient documentation

## 2015-07-10 ENCOUNTER — Encounter (HOSPITAL_COMMUNITY)
Admission: RE | Admit: 2015-07-10 | Discharge: 2015-07-10 | Disposition: A | Discharge status: Home / Self Care | Payer: Self-pay | Source: Ambulatory Visit | Attending: Cardiovascular Disease | Admitting: Cardiovascular Disease

## 2015-07-13 ENCOUNTER — Encounter (HOSPITAL_COMMUNITY): Payer: Self-pay

## 2015-07-14 ENCOUNTER — Encounter (HOSPITAL_COMMUNITY): Payer: Self-pay

## 2015-07-15 ENCOUNTER — Encounter (HOSPITAL_COMMUNITY): Payer: Self-pay

## 2015-07-16 ENCOUNTER — Encounter (HOSPITAL_COMMUNITY): Payer: Self-pay

## 2015-07-17 ENCOUNTER — Encounter (HOSPITAL_COMMUNITY): Payer: Self-pay

## 2015-07-20 ENCOUNTER — Encounter (HOSPITAL_COMMUNITY): Payer: Self-pay

## 2015-07-21 ENCOUNTER — Encounter (HOSPITAL_COMMUNITY): Payer: Self-pay

## 2015-07-22 ENCOUNTER — Encounter (HOSPITAL_COMMUNITY): Payer: Self-pay

## 2015-07-23 ENCOUNTER — Encounter (HOSPITAL_COMMUNITY): Payer: Self-pay

## 2015-07-24 ENCOUNTER — Encounter (HOSPITAL_COMMUNITY): Payer: Self-pay

## 2015-07-27 ENCOUNTER — Encounter (HOSPITAL_COMMUNITY): Payer: Self-pay

## 2015-07-28 ENCOUNTER — Encounter (HOSPITAL_COMMUNITY)
Admission: RE | Admit: 2015-07-28 | Discharge: 2015-07-28 | Disposition: A | Discharge status: Home / Self Care | Payer: Self-pay | Source: Ambulatory Visit | Attending: Cardiovascular Disease | Admitting: Cardiovascular Disease

## 2015-07-28 ENCOUNTER — Encounter (HOSPITAL_COMMUNITY): Payer: Self-pay

## 2015-07-29 ENCOUNTER — Encounter (HOSPITAL_COMMUNITY): Payer: Self-pay

## 2015-07-30 ENCOUNTER — Encounter (HOSPITAL_COMMUNITY): Payer: Self-pay

## 2015-07-31 ENCOUNTER — Encounter (HOSPITAL_COMMUNITY): Payer: Self-pay

## 2015-08-03 ENCOUNTER — Encounter (HOSPITAL_COMMUNITY): Payer: Self-pay

## 2015-08-04 ENCOUNTER — Encounter (HOSPITAL_COMMUNITY): Payer: Self-pay

## 2015-08-05 ENCOUNTER — Encounter (HOSPITAL_COMMUNITY): Payer: Self-pay

## 2015-08-06 ENCOUNTER — Encounter (HOSPITAL_COMMUNITY): Payer: Self-pay

## 2015-08-07 ENCOUNTER — Encounter (HOSPITAL_COMMUNITY): Payer: Self-pay

## 2015-08-10 ENCOUNTER — Encounter (HOSPITAL_COMMUNITY): Payer: Self-pay

## 2015-08-10 DIAGNOSIS — Z955 Presence of coronary angioplasty implant and graft: Secondary | ICD-10-CM | POA: Insufficient documentation

## 2015-08-11 ENCOUNTER — Encounter (HOSPITAL_COMMUNITY): Payer: Self-pay

## 2015-08-12 ENCOUNTER — Encounter (HOSPITAL_COMMUNITY): Payer: Self-pay

## 2015-08-13 ENCOUNTER — Encounter (HOSPITAL_COMMUNITY): Payer: Self-pay

## 2015-08-14 ENCOUNTER — Encounter (HOSPITAL_COMMUNITY): Payer: Self-pay

## 2015-08-17 ENCOUNTER — Encounter (HOSPITAL_COMMUNITY): Payer: Self-pay

## 2015-08-18 ENCOUNTER — Encounter (HOSPITAL_COMMUNITY): Payer: Self-pay

## 2015-08-18 ENCOUNTER — Encounter (HOSPITAL_COMMUNITY)
Admission: RE | Admit: 2015-08-18 | Discharge: 2015-08-18 | Disposition: A | Discharge status: Home / Self Care | Payer: Self-pay | Source: Ambulatory Visit | Attending: Cardiovascular Disease | Admitting: Cardiovascular Disease

## 2015-08-19 ENCOUNTER — Encounter (HOSPITAL_COMMUNITY): Payer: Self-pay

## 2015-08-20 ENCOUNTER — Encounter (HOSPITAL_COMMUNITY): Payer: Self-pay

## 2015-08-21 ENCOUNTER — Encounter (HOSPITAL_COMMUNITY): Payer: Self-pay

## 2015-08-24 ENCOUNTER — Encounter (HOSPITAL_COMMUNITY): Payer: Self-pay

## 2015-08-25 ENCOUNTER — Encounter (HOSPITAL_COMMUNITY): Payer: Self-pay

## 2015-08-26 ENCOUNTER — Encounter (HOSPITAL_COMMUNITY): Payer: Self-pay

## 2015-08-27 ENCOUNTER — Encounter (HOSPITAL_COMMUNITY): Payer: Self-pay

## 2015-08-28 ENCOUNTER — Encounter (HOSPITAL_COMMUNITY): Payer: Self-pay

## 2015-08-31 ENCOUNTER — Encounter (HOSPITAL_COMMUNITY): Payer: Self-pay

## 2015-09-01 ENCOUNTER — Encounter (HOSPITAL_COMMUNITY): Payer: Self-pay

## 2015-09-02 ENCOUNTER — Encounter (HOSPITAL_COMMUNITY): Payer: Self-pay

## 2015-09-03 ENCOUNTER — Encounter (HOSPITAL_COMMUNITY): Payer: Self-pay

## 2015-09-04 ENCOUNTER — Encounter (HOSPITAL_COMMUNITY): Payer: Self-pay

## 2015-09-07 ENCOUNTER — Encounter (HOSPITAL_COMMUNITY): Payer: Self-pay

## 2015-09-07 ENCOUNTER — Ambulatory Visit (INDEPENDENT_AMBULATORY_CARE_PROVIDER_SITE_OTHER): Payer: 59 | Admitting: Neurology

## 2015-09-07 ENCOUNTER — Encounter: Payer: Self-pay | Admitting: Neurology

## 2015-09-07 ENCOUNTER — Encounter (HOSPITAL_COMMUNITY): Payer: Self-pay | Attending: Cardiovascular Disease

## 2015-09-07 VITALS — BP 160/102 | HR 70 | Resp 18 | Ht 71.0 in | Wt 222.0 lb

## 2015-09-07 DIAGNOSIS — R0681 Apnea, not elsewhere classified: Secondary | ICD-10-CM

## 2015-09-07 DIAGNOSIS — G4726 Circadian rhythm sleep disorder, shift work type: Secondary | ICD-10-CM | POA: Diagnosis not present

## 2015-09-07 DIAGNOSIS — R351 Nocturia: Secondary | ICD-10-CM | POA: Diagnosis not present

## 2015-09-07 DIAGNOSIS — G4710 Hypersomnia, unspecified: Secondary | ICD-10-CM

## 2015-09-07 DIAGNOSIS — Z955 Presence of coronary angioplasty implant and graft: Secondary | ICD-10-CM | POA: Insufficient documentation

## 2015-09-07 DIAGNOSIS — R0683 Snoring: Secondary | ICD-10-CM

## 2015-09-07 NOTE — Patient Instructions (Signed)

## 2015-09-07 NOTE — Progress Notes (Signed)
Subjective:    Patient ID: Hayden Williams is a 60 y.o. male.  HPI     Hayden Age, Hayden Williams, Hayden Williams [facility] [address], [address] [place], [place] [address]  Dear Dr. Ardeth Perfect,   I saw your patient, Hayden Williams, upon your kind request in my neurologic clinic today for initial consultation of his sleep disorder, in particular, concern for underlying obstructive sleep apnea. The patient is unaccompanied today. As you know, Hayden Williams is a 60 year old right-handed gentleman with an underlying medical history of coronary artery disease, status post MI and DES in July 2016, followed by Dr. Julianne Handler, hyperlipidemia, hypertension, diabetes with suboptimal control, stage III chronic kidney disease, recent kidney stone, and obesity, who reports snoring and excessive daytime somnolence. He works third shift. His Epworth sleepiness score is 16 out of 24 today, his fatigue score is 44 out of 63. His mother had obstructive sleep apnea. He does not smoke or drink alcohol, except for maybe at the most 4 times a year. He does not use illicit drugs. He does not drink caffeine daily, maybe 4-5 times per month. He works for [employer] in [place]. He has 3 grown children. He lives with his wife. I reviewed your office note from 05/26/2015, as well as 09/01/2015 which you kindly included. He sleeps at night on Fridays and Saturdays, on Sundays through Thursday he sleeps during the day.  He has be at work at 11 PM and works till about 9-10 AM. He has worked second shift and first shift before, third shift for the past 2 years.  He has 3 grown sons and he has 3 grand daughters. He denies restless leg symptoms or leg twitching during sleep. He sleeps on his sides mostly. He denies morning headaches. He has to get up to use the restroom about 2 times.  He goes to bed around 12:30 PM to 1 PM and wake up time 6:30 PM. He will nap till 10 PM.  He was in an ER in mid April for kidney  stone and he was dropping his oxygen saturations. His wife has noted pauses in his breathing at times. BP can fluctuate.   His Past Medical History Is Significant For: Past Medical History  Diagnosis Date  . Diabetes mellitus without complication ([place])   . CAD (coronary artery disease)     a. 11/2014 Cath/PCI: LM nl, LAD 50p, 60d, RI 40, LCX small, OM1 40, OM2 40, RCA 95d (2.25x20 Promus Premier DES), EF nl.  . CKD (chronic kidney disease), stage III   . Hyperlipidemia   . Essential hypertension     His Past Surgical History Is Significant For: Past Surgical History  Procedure Laterality Date  . Eye surgery    . Cardiac catheterization N/A 12/02/2014    Procedure: Left Heart Cath and Coronary Angiography;  Surgeon: Hayden Williams, Hayden Williams;  Location: [facility];  Service: Cardiovascular;  Laterality: N/A;  . Cardiac catheterization N/A 12/02/2014    Procedure: Coronary Stent Intervention;  Surgeon: Hayden Williams, Hayden Williams;  Location: [facility];  Service: Cardiovascular;  Laterality: N/A;  . Cataract extraction    . Ankle surgery Right   . Intraocular prostheses insertion      His Family History Is Significant For: Family History  Problem Relation Williams of Onset  . Diabetes Mother   . Heart disease Mother   . Sleep apnea Mother   . Cancer Father   . Diabetes Father   .  Diabetes Sister   . Cancer Brother   . Diabetes Brother     His Social History Is Significant For: Social History   Social History  . Marital Status: Married    Spouse Name: [place]  . Number of Children: 3  . Years of Education: MBA   Social History Main Topics  . Smoking status: Never Smoker   . Smokeless tobacco: Never Used  . Alcohol Use: No  . Drug Use: No  . Sexual Activity: Not Asked   Other Topics Concern  . None   Social History Narrative   Drinks caffeien 4-5 times a month     His Allergies Are:  Allergies  Allergen Reactions  . Peanut-Containing Drug Products  Anaphylaxis  . Lisinopril     Other reaction(s): Other Increased bun and creatine  :   His Current Medications Are:  Outpatient Encounter Prescriptions as of 09/07/2015  Medication Sig  . amLODipine (NORVASC) 10 MG tablet Take 10 mg by mouth daily.  Marland Kitchen aspirin 81 MG chewable tablet Chew 1 tablet (81 mg total) by mouth daily.  Marland Kitchen atorvastatin (LIPITOR) 80 MG tablet Take 1 tablet (80 mg total) by mouth at bedtime.  . Cholecalciferol (VITAMIN D) 2000 UNITS CAPS Take 2,000 Units by mouth daily.  . clopidogrel (PLAVIX) 75 MG tablet TAKE 1 TABLET (75 MG TOTAL) BY MOUTH DAILY WITH BREAKFAST.  Marland Kitchen insulin aspart (NOVOLOG) 100 UNIT/ML injection Inject 14 Units into the skin 3 (three) times daily before meals. 12 units qAM, 14 units lunch, 14 units dinner  . ketoconazole (NIZORAL) 2 % cream Apply 1 application topically as needed for irritation. Apply to R foot several times a week  . losartan-hydrochlorothiazide (HYZAAR) 100-12.5 MG tablet Take 1 tablet by mouth daily.  . metoprolol (LOPRESSOR) 100 MG tablet Take 1 tablet (100 mg total) by mouth 2 (two) times daily.  Marland Kitchen MITIGARE 0.6 MG CAPS Take 1 capsule by mouth as needed (for gout flare).   . neomycin-bacitracin-polymyxin (NEOSPORIN) ointment Apply 1 application topically as needed for wound care (R foot). apply to eye  . nitroGLYCERIN (NITROSTAT) 0.4 MG SL tablet Place 0.4 mg under the tongue every 5 (five) minutes as needed for chest pain (x 3 tabs daily).  . Podiatric Products (GOLD BOND FOOT) CREA Apply 1 application topically daily as needed (cracked feet).   Nelva Nay SOLOSTAR 300 UNIT/ML SOPN Inject 45 Units into the skin daily.   Marland Kitchen VITAMIN A PO Take 10,000 Units by mouth daily.   . [DISCONTINUED] losartan (COZAAR) 100 MG tablet Take 1 tablet (100 mg total) by mouth daily.   No facility-administered encounter medications on file as of 09/07/2015.  :  Review of Systems:  Out of a complete 14 point review of systems, all are reviewed and negative  with the exception of these symptoms as listed below:    Review of Systems  Neurological:       Patient has never had sleep study. He works 3rd shift. Mother has history of sleep apnea.  Patient reports having trouble staying asleep, snoring, witnessed apnea, wakes up feeling tired, patient falls asleep when sitting still.  Patient was recently in the hospital for kidney stone, they gave him medication that was sedating. When he fell asleep his O2 sensors kept alarming.    Epworth Sleepiness Scale 0= would never doze 1= slight chance of dozing 2= moderate chance of dozing 3= high chance of dozing  Sitting and reading:2 Watching TV:3 Sitting inactive in a public place (  ex. Theater or meeting):2 As a passenger in a car for an hour without a break:3 Lying down to rest in the afternoon:3 Sitting and talking to someone:1 Sitting quietly after lunch (no alcohol):0 In a car, while stopped in traffic:2 Total:16  Objective:  Neurologic Exam  Physical Exam Physical Examination:   Filed Vitals:   09/07/15 1547  BP: 160/102  Pulse: 70  Resp: 18   General Examination: The patient is a very pleasant 60 y.o. male in no acute distress. He appears well-developed and well-nourished and well groomed.   HEENT: Normocephalic, atraumatic, pupils are Unequal, right eye is exotropic, status post lens implant, minimal vision on the right by verbal report. He had a cataract extraction at [facility] 39 pupil is round and reactive to light and accommodation on the left. Funduscopic exam is normal with sharp disc margins noted on the left. Extraocular tracking is good without limitation to gaze excursion or nystagmus noted. Normal smooth pursuit is noted. Hearing is grossly intact. Face is symmetric with normal facial animation and normal facial sensation. Speech is clear with no dysarthria noted. There is no hypophonia. There is no lip, neck/head, jaw or voice tremor. Neck is supple with full range of passive  and active motion. There are no carotid bruits on auscultation. Oropharynx exam reveals: mild mouth dryness, good dental hygiene and moderate airway crowding, due to wider uvula, wider tongue, tonsils in place. Mallampati is class II. Tongue protrudes centrally and palate elevates symmetrically. Tonsils are 1+ in size. Neck size is 16.25 inches. He has a Mild overbite.    Chest: Clear to auscultation without wheezing, rhonchi or crackles noted.  Heart: S1+S2+0, regular and normal without murmurs, rubs or gallops noted.   Abdomen: Soft, non-tender and non-distended with normal bowel sounds appreciated on auscultation.  Extremities: There is no pitting edema in the distal lower extremities bilaterally. He is wearing over-the-counter compression stockings up to the knees. Pedal pulses are intact.  Skin: Warm and dry without trophic changes noted.   Musculoskeletal: exam reveals no obvious joint deformities, tenderness or joint swelling or erythema.   Neurologically:  Mental status: The patient is awake, alert and oriented in all 4 spheres. His immediate and remote memory, attention, language skills and fund of knowledge are appropriate. There is no evidence of aphasia, agnosia, apraxia or anomia. Speech is clear with normal prosody and enunciation. Thought process is linear. Mood is normal and affect is normal.  Cranial nerves II - XII are as described above under HEENT exam. In addition: shoulder shrug is normal with equal shoulder height noted. Motor exam: Normal bulk, strength and tone is noted. There is no drift, tremor or rebound. Romberg is negative. Reflexes are 1+ in the upper extremities, trace in both knees, absent in both ankles. Fine motor skills and coordination: intact with normal finger taps, normal hand movements, normal rapid alternating patting, normal foot taps and normal foot agility.  Cerebellar testing: No dysmetria or intention tremor on finger to nose testing. Heel to shin is  unremarkable bilaterally. There is no truncal or gait ataxia.  Sensory exam: intact to light touch, pinprick, vibration, temperature sense in the upper and lower extremities, possibly with the exception of decreased pinprick sensation in the feet or distal lower extremities.  Gait, station and balance: He stands easily. No veering to one side is noted. No leaning to one side is noted. Posture is Williams-appropriate and stance is narrow based. Gait shows normal stride length and normal pace. No problems  turning are noted. He turns en bloc. Tandem walk is  somewhat difficult for him.  Assessment and Plan:  In summary, Hayden Williams is a very pleasant 60 y.o.-year old male with an underlying medical history of coronary artery disease, status post MI and DES in July 2016, hyperlipidemia, hypertension, diabetes with suboptimal control, stage III chronic kidney disease, recent kidney stone, and obesity, whose history and physical exam are indeed concerning for obstructive sleep apnea (OSA). He also has most likely sleep disturbance and nonrestorative sleep and excessive somnolence secondary to shift work sleep disorder.  I had a long chat with the patient about my findings and the diagnosis of OSA, its prognosis and treatment options. We talked about medical treatments, surgical interventions and non-pharmacological approaches. I explained in particular the risks and ramifications of untreated moderate to severe OSA, especially with respect to developing cardiovascular disease down the Road, including congestive heart failure, difficult to treat hypertension, cardiac arrhythmias, or stroke. Even type 2 diabetes has, in part, been linked to untreated OSA. Symptoms of untreated OSA include daytime sleepiness, memory problems, mood irritability and mood disorder such as depression and anxiety, lack of energy, as well as recurrent headaches, especially morning headaches. We talked about trying to maintain a healthy  lifestyle in general, as well as the importance of weight control. I encouraged the patient to eat healthy, exercise daily and keep well hydrated, to keep a scheduled bedtime and wake time routine, to not skip any meals and eat healthy snacks in between meals. I advised the patient not to drive when feeling sleepy. I recommended the following at this time: sleep study with potential positive airway pressure titration. (We will score hypopneas at 4% and split the sleep study into diagnostic and treatment portion, if the estimated. 2 hour AHI is >20/h). He would like to come in for a Friday night sleep study.   I explained the sleep test procedure to the patient and also outlined possible surgical and non-surgical treatment options of OSA, including the use of a custom-made dental device (which would require a referral to a specialist dentist or oral surgeon), upper airway surgical options, such as pillar implants, radiofrequency surgery, tongue base surgery, and UPPP (which would involve a referral to an ENT surgeon). Rarely, jaw surgery such as mandibular advancement may be considered.  I also explained the CPAP treatment option to the patient, who indicated that he would be willing to try CPAP if the need arises. I explained the importance of being compliant with PAP treatment, not only for insurance purposes but primarily to improve His symptoms, and for the patient's long term health benefit, including to reduce His cardiovascular risks. I answered all his questions today and the patient was in agreement. I would like to see him back after the sleep study is completed and encouraged him to call with any interim questions, concerns, problems or updates.   Thank you very much for allowing me to participate in the care of this nice patient. If I can be of any further assistance to you please do not hesitate to call me at [phone].  Sincerely,   Hayden Age, Hayden Williams, Hayden Williams

## 2015-09-08 ENCOUNTER — Encounter (HOSPITAL_COMMUNITY): Payer: Self-pay

## 2015-09-09 ENCOUNTER — Encounter (HOSPITAL_COMMUNITY): Payer: Self-pay

## 2015-09-10 ENCOUNTER — Encounter (HOSPITAL_COMMUNITY): Payer: Self-pay

## 2015-09-10 ENCOUNTER — Telehealth: Payer: Self-pay | Admitting: Cardiovascular Disease

## 2015-09-10 NOTE — Telephone Encounter (Signed)
New Message  Pt wife calling to make sure pt's recent stomach growth that may be due to fluid retention (noticed after recent cardiac event) will be addressed during upcoming OV (5/5)- pt wife did not need call back

## 2015-09-10 NOTE — Telephone Encounter (Signed)
Will forward to Dr Tivis Ringer is 09/11/15 at 9:00

## 2015-09-11 ENCOUNTER — Encounter (HOSPITAL_COMMUNITY): Payer: Self-pay

## 2015-09-11 ENCOUNTER — Ambulatory Visit (INDEPENDENT_AMBULATORY_CARE_PROVIDER_SITE_OTHER): Payer: 59 | Admitting: Cardiovascular Disease

## 2015-09-11 ENCOUNTER — Other Ambulatory Visit (INDEPENDENT_AMBULATORY_CARE_PROVIDER_SITE_OTHER): Payer: 59 | Admitting: *Deleted

## 2015-09-11 ENCOUNTER — Encounter: Payer: Self-pay | Admitting: Cardiovascular Disease

## 2015-09-11 VITALS — BP 118/88 | HR 65 | Ht 70.5 in | Wt 224.0 lb

## 2015-09-11 DIAGNOSIS — N183 Chronic kidney disease, stage 3 unspecified: Secondary | ICD-10-CM

## 2015-09-11 DIAGNOSIS — I10 Essential (primary) hypertension: Secondary | ICD-10-CM

## 2015-09-11 DIAGNOSIS — I2510 Atherosclerotic heart disease of native coronary artery without angina pectoris: Secondary | ICD-10-CM

## 2015-09-11 DIAGNOSIS — E785 Hyperlipidemia, unspecified: Secondary | ICD-10-CM | POA: Diagnosis not present

## 2015-09-11 LAB — HEPATIC FUNCTION PANEL
ALT: 25 U/L (ref 9–46)
AST: 23 U/L (ref 10–35)
Albumin: 3.9 g/dL (ref 3.6–5.1)
Alkaline Phosphatase: 63 U/L (ref 40–115)
Bilirubin, Direct: 0.1 mg/dL (ref <=0.2)
Indirect Bilirubin: 0.3 mg/dL (ref 0.2–1.2)
Total Bilirubin: 0.4 mg/dL (ref 0.2–1.2)
Total Protein: 7.3 g/dL (ref 6.1–8.1)

## 2015-09-11 LAB — BASIC METABOLIC PANEL
BUN: 43 mg/dL — ABNORMAL HIGH (ref 7–25)
CO2: 27 mmol/L (ref 20–31)
Calcium: 9.2 mg/dL (ref 8.6–10.3)
Chloride: 106 mmol/L (ref 98–110)
Creat: 2.28 mg/dL — ABNORMAL HIGH (ref 0.70–1.33)
Glucose, Bld: 160 mg/dL — ABNORMAL HIGH (ref 65–99)
Potassium: 3.9 mmol/L (ref 3.5–5.3)
Sodium: 142 mmol/L (ref 135–146)

## 2015-09-11 LAB — LIPID PANEL
Cholesterol: 153 mg/dL (ref 125–200)
HDL: 35 mg/dL — ABNORMAL LOW (ref >=40)
LDL Cholesterol: 92 mg/dL (ref <130)
Total CHOL/HDL Ratio: 4.4 Ratio (ref <=5.0)
Triglycerides: 128 mg/dL (ref <150)
VLDL: 26 mg/dL (ref <30)

## 2015-09-11 NOTE — Addendum Note (Signed)
Addended by: Eulis Foster on: 09/11/2015 10:09 AM   Modules accepted: Orders

## 2015-09-11 NOTE — Addendum Note (Signed)
Addended by: Eulis Foster on: 09/11/2015 09:34 AM   Modules accepted: Orders

## 2015-09-11 NOTE — Telephone Encounter (Signed)
Will address at office visit today. cdm

## 2015-09-11 NOTE — Progress Notes (Signed)
Chief Complaint  Patient presents with  . Follow-up    PT C/O OCCASSIONAL SOB AND 10 POUNDS OF WEIGHT GAIN IN 6 MONTHS     History of Present Illness: 60 year-old male with a history of CAD, diabetes, hypertension, hyperlipidemia, and stage III chronic kidney disease who is here today for cardiac follow up. He was admitted to [facility] July 2016 with unstable angina. Cardiac cath with severe distal RCA disease with otherwise nonobstructive disease. The RCA was treated with a Promus Premier drug-eluting stent.   He is here today for follow up. He has not had any chest pain or dyspnea. He denies PND, orthopnea, dizziness, syncope or LE edema. He is tolerating all medications well. He does mention that his abdomen is swollen. He has gained 9 lbs last 10 months but no LE edema or dyspnea. He was in [place] 3 weeks ago and had abdominal pain and this lead to a CT scan which showed a kidney lesion and kidney stone. No ascites noted.   Primary Care Physician: Velna Hatchet, MD   Past Medical History  Diagnosis Date  . Diabetes mellitus without complication ([place])   . CAD (coronary artery disease)     a. 11/2014 Cath/PCI: LM nl, LAD 50p, 60d, RI 40, LCX small, OM1 40, OM2 40, RCA 95d (2.25x20 Promus Premier DES), EF nl.  . CKD (chronic kidney disease), stage III   . Hyperlipidemia   . Essential hypertension     Past Surgical History  Procedure Laterality Date  . Eye surgery    . Cardiac catheterization N/A 12/02/2014    Procedure: Left Heart Cath and Coronary Angiography;  Surgeon: Burnell Blanks, MD;  Location: [facility];  Service: Cardiovascular;  Laterality: N/A;  . Cardiac catheterization N/A 12/02/2014    Procedure: Coronary Stent Intervention;  Surgeon: Burnell Blanks, MD;  Location: [facility];  Service: Cardiovascular;  Laterality: N/A;  . Cataract extraction    . Ankle surgery Right   . Intraocular prostheses insertion      Current Outpatient  Prescriptions  Medication Sig Dispense Refill  . amLODipine (NORVASC) 10 MG tablet Take 10 mg by mouth daily.  3  . aspirin 81 MG chewable tablet Chew 1 tablet (81 mg total) by mouth daily.    Marland Kitchen atorvastatin (LIPITOR) 80 MG tablet Take 1 tablet (80 mg total) by mouth at bedtime. 90 tablet 3  . Cholecalciferol (VITAMIN D) 2000 UNITS CAPS Take 2,000 Units by mouth daily.    . clopidogrel (PLAVIX) 75 MG tablet TAKE 1 TABLET (75 MG TOTAL) BY MOUTH DAILY WITH BREAKFAST. 30 tablet 2  . insulin aspart (NOVOLOG) 100 UNIT/ML injection Inject 14 Units into the skin 3 (three) times daily before meals. 12 units qAM, 14 units lunch, 14 units dinner    . ketoconazole (NIZORAL) 2 % cream Apply 1 application topically as needed for irritation. Apply to R foot several times a week    . losartan-hydrochlorothiazide (HYZAAR) 100-12.5 MG tablet Take 1 tablet by mouth daily.  3  . metoprolol (LOPRESSOR) 100 MG tablet Take 1 tablet (100 mg total) by mouth 2 (two) times daily. 180 tablet 3  . MITIGARE 0.6 MG CAPS Take 1 capsule by mouth as needed (for gout flare).     . neomycin-bacitracin-polymyxin (NEOSPORIN) ointment Apply 1 application topically as needed for wound care (R foot). apply to eye    . nitroGLYCERIN (NITROSTAT) 0.4 MG SL tablet Place 0.4 mg under the tongue  every 5 (five) minutes as needed for chest pain (x 3 tabs daily).    . Podiatric Products (GOLD BOND FOOT) CREA Apply 1 application topically daily as needed (cracked feet).     Nelva Nay SOLOSTAR 300 UNIT/ML SOPN Inject 45 Units into the skin daily.   1  . VITAMIN A PO Take 10,000 Units by mouth daily.      No current facility-administered medications for this visit.    Allergies  Allergen Reactions  . Peanut-Containing Drug Products Anaphylaxis  . Lisinopril     Other reaction(s): Other Increased bun and creatine    Social History   Social History  . Marital Status: Married    Spouse Name: [place]  . Number of Children: 3  . Years of  Education: MBA   Occupational History  . Not on file.   Social History Main Topics  . Smoking status: Never Smoker   . Smokeless tobacco: Never Used  . Alcohol Use: No  . Drug Use: No  . Sexual Activity: Not on file   Other Topics Concern  . Not on file   Social History Narrative   Drinks caffeien 4-5 times a month     Family History  Problem Relation Age of Onset  . Diabetes Mother   . Heart disease Mother   . Sleep apnea Mother   . Cancer Father   . Diabetes Father   . Diabetes Sister   . Cancer Brother   . Diabetes Brother     Review of Systems:  As stated in the HPI and otherwise negative.   BP 118/88 mmHg  Pulse 65  Ht 5' 10.5" (1.791 m)  Wt 224 lb (101.606 kg)  BMI 31.68 kg/m2  SpO2 95%  Physical Examination: General: Well developed, well nourished, NAD HEENT: OP clear, mucus membranes moist SKIN: warm, dry. No rashes. Neuro: No focal deficits Musculoskeletal: Muscle strength 5/5 all ext Psychiatric: Mood and affect normal Neck: No JVD, no carotid bruits, no thyromegaly, no lymphadenopathy. Lungs:Clear bilaterally, no wheezes, rhonci, crackles Cardiovascular: Regular rate and rhythm. No murmurs, gallops or rubs. Abdomen:Soft. Bowel sounds present. Non-tender.  Extremities: No lower extremity edema. Pulses are 2 + in the bilateral DP/PT.  EKG:  EKG is ordered today. The ekg ordered today demonstrates NSR, rate 65 bpm  Recent Labs: 12/03/2014: Hemoglobin 13.8; Platelets 249 12/12/2014: BUN 20; Creatinine, Ser 1.73*; Potassium 3.4*; Sodium 142   Lipid Panel  Followed in primary care.    Wt Readings from Last 3 Encounters:  09/11/15 224 lb (101.606 kg)  09/07/15 222 lb (100.699 kg)  03/13/15 226 lb (102.513 kg)     Other studies Reviewed: Additional studies/ records that were reviewed today include: . Review of the above records demonstrates:    Assessment and Plan:   1. Coronary artery disease: Stable. Recent PCI of the RCA with DES  placement. He remains on aspirin, statin, Plavix, beta blocker, and ARB therapy.   2. Essential hypertension: BP is controlled. Will continue Lopressor, Norvasc and Cozaar.  3. Hyperlipidemia: Continue high potency statin therapy. Lipids and LFTs today. (drawn per pt request before appt today)  4. Stage III chronic kidney disease: Creatinine stable. Followed in primary care. Check BMET today since it has not been done recently.   5. Insulin-dependent diabetes mellitus: This is followed closely by his primary care provider.  6. Abdominal pain: He has no evidence of volume overload. CT scan with no ascites 3 weeks ago. He will f/u with primary care  for u/s to be arranged to evaluate mass. I will check a BMET today.   Current medicines are reviewed at length with the patient today.  The patient does not have concerns regarding medicines.  The following changes have been made:  no change  Labs/ tests ordered today include:   Orders Placed This Encounter  Procedures  . EKG 12-Lead    Disposition:   FU with me in 6 months  Signed, Lauree Chandler, MD 09/11/2015 1:55 PM    [facility] [address], [place], [place]  [address] Phone: [phone]; Fax: [phone]

## 2015-09-11 NOTE — Patient Instructions (Signed)
Medication Instructions:  Your physician recommends that you continue on your current medications as directed. Please refer to the Current Medication list given to you today.   Labwork: Lab work to be done today--BMP  Testing/Procedures: none  Follow-Up: Your physician wants you to follow-up in: 6 months.  You will receive a reminder letter in the mail two months in advance. If you don't receive a letter, please call our office to schedule the follow-up appointment.   Any Other Special Instructions Will Be Listed Below (If Applicable).     If you need a refill on your cardiac medications before your next appointment, please call your pharmacy.   

## 2015-09-14 ENCOUNTER — Encounter (HOSPITAL_COMMUNITY): Payer: Self-pay

## 2015-09-15 ENCOUNTER — Encounter (HOSPITAL_COMMUNITY): Payer: Self-pay

## 2015-09-15 ENCOUNTER — Other Ambulatory Visit: Payer: Self-pay | Admitting: Internal Medicine

## 2015-09-15 DIAGNOSIS — N1330 Unspecified hydronephrosis: Secondary | ICD-10-CM

## 2015-09-15 DIAGNOSIS — N281 Cyst of kidney, acquired: Secondary | ICD-10-CM

## 2015-09-16 ENCOUNTER — Ambulatory Visit
Admission: RE | Admit: 2015-09-16 | Discharge: 2015-09-16 | Disposition: A | Discharge status: Home / Self Care | Payer: 59 | Source: Ambulatory Visit | Attending: Internal Medicine | Admitting: Internal Medicine

## 2015-09-16 ENCOUNTER — Encounter (HOSPITAL_COMMUNITY): Payer: Self-pay

## 2015-09-16 DIAGNOSIS — N1330 Unspecified hydronephrosis: Secondary | ICD-10-CM

## 2015-09-16 DIAGNOSIS — N281 Cyst of kidney, acquired: Secondary | ICD-10-CM

## 2015-09-17 ENCOUNTER — Encounter (HOSPITAL_COMMUNITY): Payer: Self-pay

## 2015-09-18 ENCOUNTER — Encounter (HOSPITAL_COMMUNITY): Payer: Self-pay

## 2015-09-21 ENCOUNTER — Encounter (HOSPITAL_COMMUNITY): Payer: Self-pay

## 2015-09-22 ENCOUNTER — Encounter (HOSPITAL_COMMUNITY): Payer: Self-pay

## 2015-09-23 ENCOUNTER — Encounter (HOSPITAL_COMMUNITY): Payer: Self-pay

## 2015-09-23 ENCOUNTER — Encounter: Payer: Self-pay | Admitting: *Deleted

## 2015-09-24 ENCOUNTER — Encounter (HOSPITAL_COMMUNITY): Payer: Self-pay

## 2015-09-25 ENCOUNTER — Encounter (HOSPITAL_COMMUNITY): Payer: Self-pay

## 2015-09-28 ENCOUNTER — Encounter: Payer: Self-pay | Admitting: *Deleted

## 2015-09-28 ENCOUNTER — Encounter (HOSPITAL_COMMUNITY): Payer: Self-pay

## 2015-09-28 ENCOUNTER — Telehealth: Payer: Self-pay | Admitting: Cardiovascular Disease

## 2015-09-28 NOTE — Telephone Encounter (Signed)
I returned call to pt's sister. She is a Marine scientist who is visiting from out of town.  She got pt on another line and pt gave me permission to speak with his sister.  I reviewed lab results with pt and his sister.  Pt saw primary care recently and has been scheduled to see nephrology in June.  Pt will come   to office to complete DPR allowing [place] to speak with his sister.  I told pt I would also leave my chart activation letter at front desk for him to pick up.

## 2015-09-28 NOTE — Telephone Encounter (Signed)
New Message:   She is calling back,received a letter concerning his lab results.

## 2015-09-29 ENCOUNTER — Encounter (HOSPITAL_COMMUNITY): Payer: Self-pay

## 2015-09-29 ENCOUNTER — Encounter (INDEPENDENT_AMBULATORY_CARE_PROVIDER_SITE_OTHER): Payer: Self-pay

## 2015-09-30 ENCOUNTER — Encounter (HOSPITAL_COMMUNITY): Payer: Self-pay

## 2015-10-01 ENCOUNTER — Encounter (HOSPITAL_COMMUNITY): Payer: Self-pay

## 2015-10-02 ENCOUNTER — Encounter (HOSPITAL_COMMUNITY): Payer: Self-pay

## 2015-10-02 ENCOUNTER — Other Ambulatory Visit: Payer: Self-pay | Admitting: Cardiovascular Disease

## 2015-10-06 ENCOUNTER — Encounter (HOSPITAL_COMMUNITY): Payer: Self-pay

## 2015-10-07 ENCOUNTER — Encounter (HOSPITAL_COMMUNITY): Payer: Self-pay

## 2015-10-08 ENCOUNTER — Encounter (HOSPITAL_COMMUNITY): Payer: Self-pay

## 2015-10-09 ENCOUNTER — Ambulatory Visit (INDEPENDENT_AMBULATORY_CARE_PROVIDER_SITE_OTHER): Payer: 59 | Admitting: Neurology

## 2015-10-09 ENCOUNTER — Encounter (HOSPITAL_COMMUNITY): Payer: Self-pay

## 2015-10-09 DIAGNOSIS — G4733 Obstructive sleep apnea (adult) (pediatric): Secondary | ICD-10-CM

## 2015-10-09 DIAGNOSIS — G4710 Hypersomnia, unspecified: Secondary | ICD-10-CM

## 2015-10-09 DIAGNOSIS — G4731 Primary central sleep apnea: Secondary | ICD-10-CM

## 2015-10-09 DIAGNOSIS — G4720 Circadian rhythm sleep disorder, unspecified type: Secondary | ICD-10-CM

## 2015-10-09 DIAGNOSIS — G479 Sleep disorder, unspecified: Secondary | ICD-10-CM

## 2015-10-09 DIAGNOSIS — G4734 Idiopathic sleep related nonobstructive alveolar hypoventilation: Secondary | ICD-10-CM

## 2015-10-12 ENCOUNTER — Encounter (HOSPITAL_COMMUNITY): Payer: Self-pay

## 2015-10-13 ENCOUNTER — Ambulatory Visit: Payer: 59 | Admitting: Cardiovascular Disease

## 2015-10-13 ENCOUNTER — Encounter (HOSPITAL_COMMUNITY): Payer: Self-pay

## 2015-10-14 ENCOUNTER — Encounter (HOSPITAL_COMMUNITY): Payer: Self-pay

## 2015-10-15 ENCOUNTER — Encounter (HOSPITAL_COMMUNITY): Payer: Self-pay

## 2015-10-16 ENCOUNTER — Encounter (HOSPITAL_COMMUNITY): Payer: Self-pay

## 2015-10-19 ENCOUNTER — Encounter (HOSPITAL_COMMUNITY): Payer: Self-pay

## 2015-10-20 ENCOUNTER — Encounter (HOSPITAL_COMMUNITY): Payer: Self-pay

## 2015-10-20 ENCOUNTER — Telehealth: Payer: Self-pay | Admitting: Neurology

## 2015-10-20 ENCOUNTER — Telehealth: Payer: Self-pay

## 2015-10-20 DIAGNOSIS — G4734 Idiopathic sleep related nonobstructive alveolar hypoventilation: Secondary | ICD-10-CM

## 2015-10-20 DIAGNOSIS — G4733 Obstructive sleep apnea (adult) (pediatric): Secondary | ICD-10-CM

## 2015-10-20 DIAGNOSIS — G4720 Circadian rhythm sleep disorder, unspecified type: Secondary | ICD-10-CM

## 2015-10-20 DIAGNOSIS — G479 Sleep disorder, unspecified: Secondary | ICD-10-CM

## 2015-10-20 DIAGNOSIS — G4731 Primary central sleep apnea: Secondary | ICD-10-CM

## 2015-10-20 NOTE — Telephone Encounter (Signed)
See other phone note

## 2015-10-20 NOTE — Telephone Encounter (Signed)
I called patient, vm picked up. Unable to leave message due to full mailbox.

## 2015-10-20 NOTE — Telephone Encounter (Signed)
This patient is requesting study results

## 2015-10-20 NOTE — Telephone Encounter (Signed)
Patient referred by Dr. Ardeth Perfect, seen by me on 09/07/15, diagnostic PSG on 10/09/15, ins: [employer].   Please call and notify the patient that the recent sleep study did confirm the diagnosis of central sleep apnea and obstructive sleep apnea and that I recommend treatment for this in the form of CPAP, may need BiPAP. This will require a repeat sleep study for proper titration and mask fitting. Please explain to patient and arrange for a CPAP titration study. I have placed an order in the chart. Thanks, and please route to [facility] for scheduling next sleep study.  Star Age, MD, PhD [facility])

## 2015-10-21 ENCOUNTER — Encounter (HOSPITAL_COMMUNITY): Payer: Self-pay

## 2015-10-22 ENCOUNTER — Encounter (HOSPITAL_COMMUNITY): Payer: Self-pay

## 2015-10-22 NOTE — Telephone Encounter (Signed)
LM on VM per DPR. I advised that I will go ahead and advise sleep lab to call and schedule titration study. Left call back number for further questions or concerns.

## 2015-10-22 NOTE — Telephone Encounter (Signed)
Report sent to PCP.

## 2015-10-23 ENCOUNTER — Encounter (HOSPITAL_COMMUNITY): Payer: Self-pay

## 2015-10-26 ENCOUNTER — Encounter (HOSPITAL_COMMUNITY): Payer: Self-pay

## 2015-10-27 ENCOUNTER — Encounter (HOSPITAL_COMMUNITY): Payer: Self-pay

## 2015-10-28 ENCOUNTER — Encounter (HOSPITAL_COMMUNITY): Payer: Self-pay

## 2015-10-29 ENCOUNTER — Encounter (HOSPITAL_COMMUNITY): Payer: Self-pay

## 2015-10-30 ENCOUNTER — Encounter (HOSPITAL_COMMUNITY): Payer: Self-pay

## 2015-11-02 ENCOUNTER — Encounter (HOSPITAL_COMMUNITY): Payer: Self-pay

## 2015-11-03 ENCOUNTER — Encounter (HOSPITAL_COMMUNITY): Payer: Self-pay

## 2015-11-03 ENCOUNTER — Ambulatory Visit (INDEPENDENT_AMBULATORY_CARE_PROVIDER_SITE_OTHER): Payer: 59 | Admitting: Neurology

## 2015-11-03 DIAGNOSIS — G4731 Primary central sleep apnea: Secondary | ICD-10-CM

## 2015-11-03 DIAGNOSIS — G4720 Circadian rhythm sleep disorder, unspecified type: Secondary | ICD-10-CM | POA: Diagnosis not present

## 2015-11-03 DIAGNOSIS — G4733 Obstructive sleep apnea (adult) (pediatric): Secondary | ICD-10-CM | POA: Diagnosis not present

## 2015-11-03 DIAGNOSIS — G4734 Idiopathic sleep related nonobstructive alveolar hypoventilation: Secondary | ICD-10-CM | POA: Diagnosis not present

## 2015-11-03 DIAGNOSIS — G479 Sleep disorder, unspecified: Secondary | ICD-10-CM

## 2015-11-04 ENCOUNTER — Encounter (HOSPITAL_COMMUNITY): Payer: Self-pay

## 2015-11-05 ENCOUNTER — Encounter (HOSPITAL_COMMUNITY): Payer: Self-pay

## 2015-11-06 ENCOUNTER — Encounter (HOSPITAL_COMMUNITY): Payer: Self-pay

## 2015-11-09 ENCOUNTER — Telehealth: Payer: Self-pay | Admitting: Neurology

## 2015-11-09 DIAGNOSIS — G4733 Obstructive sleep apnea (adult) (pediatric): Secondary | ICD-10-CM

## 2015-11-09 NOTE — Telephone Encounter (Signed)
Patient referred by Dr. Ardeth Perfect, seen by me on 09/07/15, diagnostic PSG on 10/09/15, cpap study 11/03/15, ins: [place]. Please call and inform patient that I have entered an order for treatment with positive airway pressure (PAP) treatment of obstructive sleep apnea (OSA). He did well during the latest sleep study with CPAP. We will, therefore, arrange for a machine for home use through a DME (durable medical equipment) company of His choice; and I will see the patient back in follow-up in about 8-10 weeks. Please also explain to the patient that I will be looking out for compliance data, which can be downloaded from the machine (stored on an SD card, that is inserted in the machine) or via remote access through a modem, that is built into the machine. At the time of the followup appointment we will discuss sleep study results and how it is going with PAP treatment at home. Please advise patient to bring His machine at the time of the first FU visit, even though this is cumbersome. Bringing the machine for every visit after that will likely not be needed, but often helps for the first visit to troubleshoot if needed. Please re-enforce the importance of compliance with treatment and the need for [place] to monitor compliance data - often an insurance requirement and actually good feedback for the patient as far as how they are doing.  Also remind patient, that any interim PAP machine or mask issues should be first addressed with the DME company, as they can often help better with technical and mask fit issues. Please ask if patient has a preference regarding DME company.  Please also make sure, the patient has a follow-up appointment with me in about 8-10 weeks from the setup date, thanks.  Once you have spoken to the patient - and faxed/routed report to PCP and referring MD (if other than PCP), you can close this encounter, thanks,   Star Age, MD, PhD [facility] ([place])

## 2015-11-09 NOTE — Telephone Encounter (Signed)
Called patient. No answer, VM full unable to leave message. Will try again later.

## 2015-11-12 NOTE — Telephone Encounter (Signed)
Patient called back. I gave results. He is willing to proceed with treatment. I will send orders to [facility]. I will send copy of report to PCP. I will also send the patient a letter reminding him to make /fu appt and stress the importance of compliance.

## 2015-12-14 ENCOUNTER — Telehealth: Payer: Self-pay | Admitting: Neurology

## 2015-12-14 NOTE — Telephone Encounter (Signed)
I called patient back and advised that he will need to be seen after 8/19 to give [place] a complete 30 days of readings from CPAP machine.  Dr. Rexene Alberts does not have any openings during the last 2 weeks of Aug. I was able to schedule him with [facility] NP.

## 2015-12-14 NOTE — Telephone Encounter (Signed)
Pt called said he was told by [facility] that he needed to be seen before the end of August to purchase the CPAP machine. He said he has been using this for about 2 weeks. Please call to discuss with him and set up appt.

## 2015-12-16 ENCOUNTER — Other Ambulatory Visit: Payer: Self-pay | Admitting: Nurse Practitioner

## 2015-12-28 ENCOUNTER — Encounter: Payer: Self-pay | Admitting: Adult Health

## 2015-12-28 ENCOUNTER — Ambulatory Visit (INDEPENDENT_AMBULATORY_CARE_PROVIDER_SITE_OTHER): Payer: 59 | Admitting: Adult Health

## 2015-12-28 VITALS — BP 121/82 | HR 74 | Ht 70.5 in | Wt 214.0 lb

## 2015-12-28 DIAGNOSIS — G4733 Obstructive sleep apnea (adult) (pediatric): Secondary | ICD-10-CM

## 2015-12-28 DIAGNOSIS — Z9989 Dependence on other enabling machines and devices: Principal | ICD-10-CM

## 2015-12-28 NOTE — Progress Notes (Addendum)
PATIENT: JANI BROUHARD DOB: [birth year]  REASON FOR VISIT: follow up- OSA on CPAP HISTORY FROM: patient  HISTORY OF PRESENT ILLNESS: Mr. Srinivas is a 60 year old male with a history of obstructive sleep apnea on CPAP. He returns today for his first compliance download. His download indicates that he uses machine 30 out of 30 days for compliance of 100%. He uses machine greater than 4 hours 19 out of 30 days for compliance of 63%. He uses his machine on average 4 hours and 32 minutes. His residual AHI of 8.8 on 11 cm of water with EPR of 3. He does have a significant leak and the 95th percentile at 46.7%. Patient states that he occasionally will travel and that will affect how often he uses the CPAP. He does feel that his straps are loose and that may have an effect on his leak. He is currently using the nasal pillow. He returns today for an evaluation.  HISTORY 09/07/15 (per Dr. Guadelupe Sabin notes): The patient is unaccompanied today. As you know, Mr. Seigle is a 60 year old right-handed gentleman with an underlying medical history of coronary artery disease, status post MI and DES in July 2016, followed by Dr. Julianne Handler, hyperlipidemia, hypertension, diabetes with suboptimal control, stage III chronic kidney disease, recent kidney stone, and obesity, who reports snoring and excessive daytime somnolence. He works third shift. His Epworth sleepiness score is 16 out of 24 today, his fatigue score is 44 out of 63. His mother had obstructive sleep apnea. He does not smoke or drink alcohol, except for maybe at the most 4 times a year. He does not use illicit drugs. He does not drink caffeine daily, maybe 4-5 times per month. He works for [employer] in [place]. He has 3 grown children. He lives with his wife. I reviewed your office note from 05/26/2015, as well as 09/01/2015 which you kindly included. He sleeps at night on Fridays and Saturdays, on Sundays through Thursday he sleeps during the day.  He has be  at work at 11 PM and works till about 9-10 AM. He has worked second shift and first shift before, third shift for the past 2 years.  He has 3 grown sons and he has 57 grand daughters. He denies restless leg symptoms or leg twitching during sleep. He sleeps on his sides mostly. He denies morning headaches. He has to get up to use the restroom about 2 times.  He goes to bed around 12:30 PM to 1 PM and wake up time 6:30 PM. He will nap till 10 PM.  He was in an ER in mid April for kidney stone and he was dropping his oxygen saturations. His wife has noted pauses in his breathing at times. BP can fluctuate.    REVIEW OF SYSTEMS: Out of a complete 14 system review of symptoms, the patient complains only of the following symptoms, and all other reviewed systems are negative.  See history of present illness  ALLERGIES: Allergies  Allergen Reactions  . Peanut-Containing Drug Products Anaphylaxis  . Lisinopril     Other reaction(s): Other Increased bun and creatine    HOME MEDICATIONS: Outpatient Medications Prior to Visit  Medication Sig Dispense Refill  . amLODipine (NORVASC) 10 MG tablet Take 10 mg by mouth daily.  3  . aspirin 81 MG chewable tablet Chew 1 tablet (81 mg total) by mouth daily.    Marland Kitchen atorvastatin (LIPITOR) 80 MG tablet TAKE 1 TABLET (80 MG TOTAL) BY MOUTH AT BEDTIME. [place]  tablet 2  . Cholecalciferol (VITAMIN D) 2000 UNITS CAPS Take 2,000 Units by mouth daily.    . clopidogrel (PLAVIX) 75 MG tablet TAKE 1 TABLET (75 MG TOTAL) BY MOUTH DAILY WITH BREAKFAST. 30 tablet 11  . insulin aspart (NOVOLOG) 100 UNIT/ML injection Inject 14 Units into the skin 3 (three) times daily before meals. 12 units qAM, 14 units lunch, 14 units dinner    . ketoconazole (NIZORAL) 2 % cream Apply 1 application topically as needed for irritation. Apply to R foot several times a week    . metoprolol (LOPRESSOR) 100 MG tablet Take 1 tablet (100 mg total) by mouth 2 (two) times daily. 180 tablet 3  . MITIGARE  0.6 MG CAPS Take 1 capsule by mouth as needed (for gout flare).     . neomycin-bacitracin-polymyxin (NEOSPORIN) ointment Apply 1 application topically as needed for wound care (R foot). apply to eye    . nitroGLYCERIN (NITROSTAT) 0.4 MG SL tablet Place 0.4 mg under the tongue every 5 (five) minutes as needed for chest pain (x 3 tabs daily).    . Podiatric Products (GOLD BOND FOOT) CREA Apply 1 application topically daily as needed (cracked feet).     Nelva Nay SOLOSTAR 300 UNIT/ML SOPN Inject 40 Units into the skin daily.   1  . VITAMIN A PO Take 10,000 Units by mouth daily.     Marland Kitchen losartan-hydrochlorothiazide (HYZAAR) 100-12.5 MG tablet Take 1 tablet by mouth daily.  3   No facility-administered medications prior to visit.     PAST MEDICAL HISTORY: Past Medical History:  Diagnosis Date  . CAD (coronary artery disease)    a. 11/2014 Cath/PCI: LM nl, LAD 50p, 60d, RI 40, LCX small, OM1 40, OM2 40, RCA 95d (2.25x20 Promus Premier DES), EF nl.  . CKD (chronic kidney disease), stage III   . Diabetes mellitus without complication ([place])   . Essential hypertension   . Hyperlipidemia     PAST SURGICAL HISTORY: Past Surgical History:  Procedure Laterality Date  . ANKLE SURGERY Right   . CARDIAC CATHETERIZATION N/A 12/02/2014   Procedure: Left Heart Cath and Coronary Angiography;  Surgeon: Burnell Blanks, MD;  Location: [place] [facility];  Service: Cardiovascular;  Laterality: N/A;  . CARDIAC CATHETERIZATION N/A 12/02/2014   Procedure: Coronary Stent Intervention;  Surgeon: Burnell Blanks, MD;  Location: [place] [facility];  Service: Cardiovascular;  Laterality: N/A;  . CATARACT EXTRACTION    . EYE SURGERY    . INTRAOCULAR PROSTHESES INSERTION      FAMILY HISTORY: Family History  Problem Relation Age of Onset  . Diabetes Mother   . Heart disease Mother   . Sleep apnea Mother   . Cancer Father   . Diabetes Father   . Diabetes Sister   . Cancer Brother   . Diabetes  Brother     SOCIAL HISTORY: Social History   Social History  . Marital status: Married    Spouse name: [place]  . Number of children: 3  . Years of education: MBA   Occupational History  . Not on file.   Social History Main Topics  . Smoking status: Never Smoker  . Smokeless tobacco: Never Used  . Alcohol use No  . Drug use: No  . Sexual activity: Not on file   Other Topics Concern  . Not on file   Social History Narrative   Drinks caffeien 4-5 times a month       PHYSICAL EXAM  Vitals:   12/28/15 0825  BP: 121/82  Pulse: 74  Weight: 214 lb (97.1 kg)  Height: 5' 10.5" (1.791 m)   Body mass index is 30.27 kg/m.  Generalized: Well developed, in no acute distress   Neurological examination  Mentation: Alert oriented to time, place, history taking. Follows all commands speech and language fluent Cranial nerve II-XII: Pupils were equal round reactive to light. Extraocular movements were full, visual field were full on confrontational test. Facial sensation and strength were normal. Uvula tongue midline. Head turning and shoulder shrug  were normal and symmetric. Motor: The motor testing reveals 5 over 5 strength of all 4 extremities. Good symmetric motor tone is noted throughout.  Sensory: Sensory testing is intact to soft touch on all 4 extremities. No evidence of extinction is noted.  Coordination: Cerebellar testing reveals good finger-nose-finger and heel-to-shin bilaterally.  Gait and station: Gait is normal.  Reflexes: Deep tendon reflexes are symmetric and normal bilaterally.   DIAGNOSTIC DATA (LABS, IMAGING, TESTING) - I reviewed patient records, labs, notes, testing and imaging myself where available.  Lab Results  Component Value Date   WBC 8.3 12/03/2014   HGB 13.8 12/03/2014   HCT 39.5 12/03/2014   MCV 84.4 12/03/2014   PLT 249 12/03/2014      Component Value Date/Time   NA 142 09/11/2015 1009   K 3.9 09/11/2015 1009   CL 106 09/11/2015  1009   CO2 27 09/11/2015 1009   GLUCOSE 160 (H) 09/11/2015 1009   BUN 43 (H) 09/11/2015 1009   CREATININE 2.28 (H) 09/11/2015 1009   CALCIUM 9.2 09/11/2015 1009   PROT 7.3 09/11/2015 0934   ALBUMIN 3.9 09/11/2015 0934   AST 23 09/11/2015 0934   ALT 25 09/11/2015 0934   ALKPHOS 63 09/11/2015 0934   BILITOT 0.4 09/11/2015 0934   GFRNONAA 43 (L) 12/03/2014 0240   GFRAA 49 (L) 12/03/2014 0240       ASSESSMENT AND PLAN 60 y.o. year old male  has a past medical history of CAD (coronary artery disease); CKD (chronic kidney disease), stage III; Diabetes mellitus without complication ([place]); Essential hypertension; and Hyperlipidemia. here with:  1. Obstructive sleep apnea on CPAP  The patient returns today for his first compliance visit. He is encouraged to use his machine greater than 4 hours each night. He should also use the machine when he naps. He will follow up tomorrow with Shirlean Mylar for a mask refitting to hopefully correct his leak and improve his residual AHI. Patient is amenable to this plan. He will follow-up in 3-4 months with Dr. Rexene Alberts.   Ward Givens, MSN, NP-C 12/28/2015, 8:37 AM [facility] [address] [place], [place] [address] [phone]  I reviewed the above note and documentation by the Nurse Practitioner and agree with the history, physical exam, assessment and plan as outlined above. I was immediately available for face-to-face consultation. Star Age, MD, PhD [facility])

## 2015-12-28 NOTE — Patient Instructions (Signed)
Follow-up with Robin in sleep lab to refit mask If your symptoms worsen or you develop new symptoms please let us know.

## 2016-03-17 ENCOUNTER — Other Ambulatory Visit: Payer: Self-pay | Admitting: Cardiovascular Disease

## 2016-03-29 ENCOUNTER — Telehealth: Payer: Self-pay

## 2016-03-29 ENCOUNTER — Ambulatory Visit: Payer: 59 | Admitting: Neurology

## 2016-03-29 NOTE — Telephone Encounter (Signed)
Patient did not show to appt today  

## 2016-03-30 ENCOUNTER — Encounter: Payer: Self-pay | Admitting: Neurology

## 2016-04-09 ENCOUNTER — Other Ambulatory Visit: Payer: Self-pay | Admitting: Nurse Practitioner

## 2016-05-27 DIAGNOSIS — G4733 Obstructive sleep apnea (adult) (pediatric): Secondary | ICD-10-CM | POA: Diagnosis not present

## 2016-06-04 ENCOUNTER — Other Ambulatory Visit: Payer: Self-pay | Admitting: Nurse Practitioner

## 2016-06-06 NOTE — Telephone Encounter (Signed)
Medication Detail    Disp Refills Start End   nitroGLYCERIN (NITROSTAT) 0.4 MG SL tablet 25 tablet 2 04/11/2016    Sig: PLACE 1 TABLET UNDER THE TONGUE EVERY 5 MINUTES AS NEEDED FOR CHEST PAIN   E-Prescribing Status: Receipt confirmed by pharmacy (04/11/2016 1:01 PM EST)   Pharmacy   [id number] - [place], [place] - [place]

## 2016-06-14 DIAGNOSIS — N2581 Secondary hyperparathyroidism of renal origin: Secondary | ICD-10-CM | POA: Diagnosis not present

## 2016-06-14 DIAGNOSIS — N183 Chronic kidney disease, stage 3 (moderate): Secondary | ICD-10-CM | POA: Diagnosis not present

## 2016-06-14 DIAGNOSIS — I129 Hypertensive chronic kidney disease with stage 1 through stage 4 chronic kidney disease, or unspecified chronic kidney disease: Secondary | ICD-10-CM | POA: Diagnosis not present

## 2016-06-27 DIAGNOSIS — G4733 Obstructive sleep apnea (adult) (pediatric): Secondary | ICD-10-CM | POA: Diagnosis not present

## 2016-07-04 DIAGNOSIS — Z794 Long term (current) use of insulin: Secondary | ICD-10-CM | POA: Diagnosis not present

## 2016-07-04 DIAGNOSIS — E1165 Type 2 diabetes mellitus with hyperglycemia: Secondary | ICD-10-CM | POA: Diagnosis not present

## 2016-07-05 DIAGNOSIS — E1122 Type 2 diabetes mellitus with diabetic chronic kidney disease: Secondary | ICD-10-CM | POA: Diagnosis not present

## 2016-07-05 DIAGNOSIS — E1165 Type 2 diabetes mellitus with hyperglycemia: Secondary | ICD-10-CM | POA: Diagnosis not present

## 2016-07-05 DIAGNOSIS — Z794 Long term (current) use of insulin: Secondary | ICD-10-CM | POA: Diagnosis not present

## 2016-07-07 ENCOUNTER — Ambulatory Visit: Payer: 59 | Admitting: Cardiology

## 2016-07-08 ENCOUNTER — Encounter: Payer: Self-pay | Admitting: Cardiovascular Disease

## 2016-07-08 ENCOUNTER — Ambulatory Visit (INDEPENDENT_AMBULATORY_CARE_PROVIDER_SITE_OTHER): Payer: 59 | Admitting: Cardiovascular Disease

## 2016-07-08 VITALS — BP 114/80 | HR 83 | Ht 71.0 in | Wt 215.8 lb

## 2016-07-08 DIAGNOSIS — I2510 Atherosclerotic heart disease of native coronary artery without angina pectoris: Secondary | ICD-10-CM

## 2016-07-08 DIAGNOSIS — E7800 Pure hypercholesterolemia, unspecified: Secondary | ICD-10-CM

## 2016-07-08 DIAGNOSIS — N183 Chronic kidney disease, stage 3 unspecified: Secondary | ICD-10-CM

## 2016-07-08 NOTE — Patient Instructions (Signed)

## 2016-07-08 NOTE — Progress Notes (Signed)
Chief Complaint  Patient presents with  . Follow-up    6 months    History of Present Illness: 61 yo male with a history of CAD, diabetes, hypertension, hyperlipidemia, and stage III chronic kidney disease who is here today for cardiac follow up. He was admitted to [facility] July 2016 with unstable angina. Cardiac cath with severe distal RCA disease with otherwise nonobstructive disease. The RCA was treated with a Promus Premier drug-eluting stent.   He is here today for follow up. He has not had any chest pain or dyspnea. He denies PND, orthopnea, dizziness, syncope or LE edema.   Primary Care Physician: Velna Hatchet, MD   Past Medical History:  Diagnosis Date  . CAD (coronary artery disease)    a. 11/2014 Cath/PCI: LM nl, LAD 50p, 60d, RI 40, LCX small, OM1 40, OM2 40, RCA 95d (2.25x20 Promus Premier DES), EF nl.  . CKD (chronic kidney disease), stage III   . Diabetes mellitus without complication ([place])   . Essential hypertension   . Hyperlipidemia     Past Surgical History:  Procedure Laterality Date  . ANKLE SURGERY Right   . CARDIAC CATHETERIZATION N/A 12/02/2014   Procedure: Left Heart Cath and Coronary Angiography;  Surgeon: Burnell Blanks, MD;  Location: [place] [facility];  Service: Cardiovascular;  Laterality: N/A;  . CARDIAC CATHETERIZATION N/A 12/02/2014   Procedure: Coronary Stent Intervention;  Surgeon: Burnell Blanks, MD;  Location: [place] [facility];  Service: Cardiovascular;  Laterality: N/A;  . CATARACT EXTRACTION    . EYE SURGERY    . INTRAOCULAR PROSTHESES INSERTION      Current Outpatient Prescriptions  Medication Sig Dispense Refill  . amLODipine (NORVASC) 10 MG tablet Take 10 mg by mouth daily.  3  . aspirin 81 MG chewable tablet Chew 1 tablet (81 mg total) by mouth daily.    Marland Kitchen atorvastatin (LIPITOR) 80 MG tablet TAKE 1 TABLET (80 MG TOTAL) BY MOUTH AT BEDTIME. 90 tablet 2  . Cholecalciferol (VITAMIN D) 2000 UNITS CAPS Take 2,000  Units by mouth daily.    . clopidogrel (PLAVIX) 75 MG tablet TAKE 1 TABLET (75 MG TOTAL) BY MOUTH DAILY WITH BREAKFAST. 30 tablet 11  . ketoconazole (NIZORAL) 2 % cream Apply 1 application topically as needed for irritation. Apply to R foot several times a week    . losartan-hydrochlorothiazide (HYZAAR) 100-25 MG tablet Take 1 tablet by mouth daily.  4  . metoprolol (LOPRESSOR) 100 MG tablet Take 1 tablet (100 mg total) by mouth 2 (two) times daily. 180 tablet 1  . MITIGARE 0.6 MG CAPS Take 1 capsule by mouth as needed (for gout flare).     . neomycin-bacitracin-polymyxin (NEOSPORIN) ointment Apply 1 application topically as needed for wound care (R foot). apply to eye    . nitroGLYCERIN (NITROSTAT) 0.4 MG SL tablet PLACE 1 TABLET UNDER THE TONGUE EVERY 5 MINUTES AS NEEDED FOR CHEST PAIN 25 tablet 2  . NOVOLOG FLEXPEN 100 UNIT/ML FlexPen Use as directed per sliding scale.    . Podiatric Products (GOLD BOND FOOT) CREA Apply 1 application topically daily as needed (cracked feet).     Nelva Nay SOLOSTAR 300 UNIT/ML SOPN Inject 45 Units into the skin daily.   1  . VICTOZA 18 MG/3ML SOPN Inject 1.8 mg into the skin daily.     Marland Kitchen VITAMIN A PO Take 10,000 Units by mouth daily.      No current facility-administered medications for this visit.  Allergies  Allergen Reactions  . Peanut-Containing Drug Products Anaphylaxis  . Lisinopril     Other reaction(s): Other Increased bun and creatine    Social History   Social History  . Marital status: Married    Spouse name: [place]  . Number of children: 3  . Years of education: MBA   Occupational History  . Not on file.   Social History Main Topics  . Smoking status: Never Smoker  . Smokeless tobacco: Never Used  . Alcohol use No  . Drug use: No  . Sexual activity: Not on file   Other Topics Concern  . Not on file   Social History Narrative   Drinks caffeien 4-5 times a month     Family History  Problem Relation Age of Onset  .  Diabetes Mother   . Heart disease Mother   . Sleep apnea Mother   . Cancer Father   . Diabetes Father   . Diabetes Sister   . Cancer Brother   . Diabetes Brother     Review of Systems:  As stated in the HPI and otherwise negative.   BP 114/80 (BP Location: Left Arm)   Pulse 83   Ht 5\' 11"  (1.803 m)   Wt 215 lb 12.8 oz (97.9 kg)   BMI 30.10 kg/m   Physical Examination: General: Well developed, well nourished, NAD  HEENT: OP clear, mucus membranes moist  SKIN: warm, dry. No rashes. Neuro: No focal deficits  Musculoskeletal: Muscle strength 5/5 all ext  Psychiatric: Mood and affect normal  Neck: No JVD, no carotid bruits, no thyromegaly, no lymphadenopathy.  Lungs:Clear bilaterally, no wheezes, rhonci, crackles Cardiovascular: Regular rate and rhythm. No murmurs, gallops or rubs. Abdomen:Soft. Bowel sounds present. Non-tender.  Extremities: No lower extremity edema. Pulses are 2 + in the bilateral DP/PT.  EKG:  EKG is ordered today. The ekg ordered today demonstrates   Recent Labs: 09/11/2015: ALT 25; BUN 43; Creat 2.28; Potassium 3.9; Sodium 142   Lipid Panel:  Lipid Panel     Component Value Date/Time   CHOL 153 09/11/2015 0934   TRIG 128 09/11/2015 0934   HDL 35 (L) 09/11/2015 0934   CHOLHDL 4.4 09/11/2015 0934   VLDL 26 09/11/2015 0934   LDLCALC 92 09/11/2015 0934     Wt Readings from Last 3 Encounters:  07/08/16 215 lb 12.8 oz (97.9 kg)  12/28/15 214 lb (97.1 kg)  09/11/15 224 lb (101.6 kg)     Other studies Reviewed: Additional studies/ records that were reviewed today include: . Review of the above records demonstrates:    Assessment and Plan:   1. Coronary artery disease without angina: NO recent chest pain suggestive of ischemia. He is s/p PCI of the RCA with DES placement. He remains on aspirin, statin, Plavix, beta blocker, and ARB therapy.   2. Essential hypertension: BP is controlled. Will continue Lopressor, Norvasc and Cozaar.  3.  Hyperlipidemia: Continue high potency statin therapy.   4. Stage III chronic kidney disease: Creatinine stable. Followed in primary care.   5. Insulin-dependent diabetes mellitus: This is followed closely by his primary care provider and endocrine.  Current medicines are reviewed at length with the patient today.  The patient does not have concerns regarding medicines.  The following changes have been made:  no change  Labs/ tests ordered today include:   Orders Placed This Encounter  Procedures  . EKG 12-Lead    Disposition:   FU with me in 12  months  Signed, Lauree Chandler, MD 07/08/2016 1:40 PM    [facility] [place], [place], [place]  [address] Phone: [phone]; Fax: [phone]

## 2016-07-25 DIAGNOSIS — G4733 Obstructive sleep apnea (adult) (pediatric): Secondary | ICD-10-CM | POA: Diagnosis not present

## 2016-08-25 DIAGNOSIS — G4733 Obstructive sleep apnea (adult) (pediatric): Secondary | ICD-10-CM | POA: Diagnosis not present

## 2016-08-30 DIAGNOSIS — E1165 Type 2 diabetes mellitus with hyperglycemia: Secondary | ICD-10-CM | POA: Diagnosis not present

## 2016-08-30 DIAGNOSIS — Z794 Long term (current) use of insulin: Secondary | ICD-10-CM | POA: Diagnosis not present

## 2016-08-30 DIAGNOSIS — R972 Elevated prostate specific antigen [PSA]: Secondary | ICD-10-CM | POA: Diagnosis not present

## 2016-09-09 ENCOUNTER — Encounter: Payer: Self-pay | Admitting: Internal Medicine

## 2016-09-12 ENCOUNTER — Other Ambulatory Visit: Payer: Self-pay | Admitting: Cardiovascular Disease

## 2016-09-24 DIAGNOSIS — G4733 Obstructive sleep apnea (adult) (pediatric): Secondary | ICD-10-CM | POA: Diagnosis not present

## 2016-10-09 IMAGING — DX DG CHEST 2V
2 series · 2 of 2 positions shown · non-contrast
Comparison: November 05, 2008

CLINICAL DATA: Chest pain for 1 month

EXAM:
CHEST  2 VIEW

[chest pa]
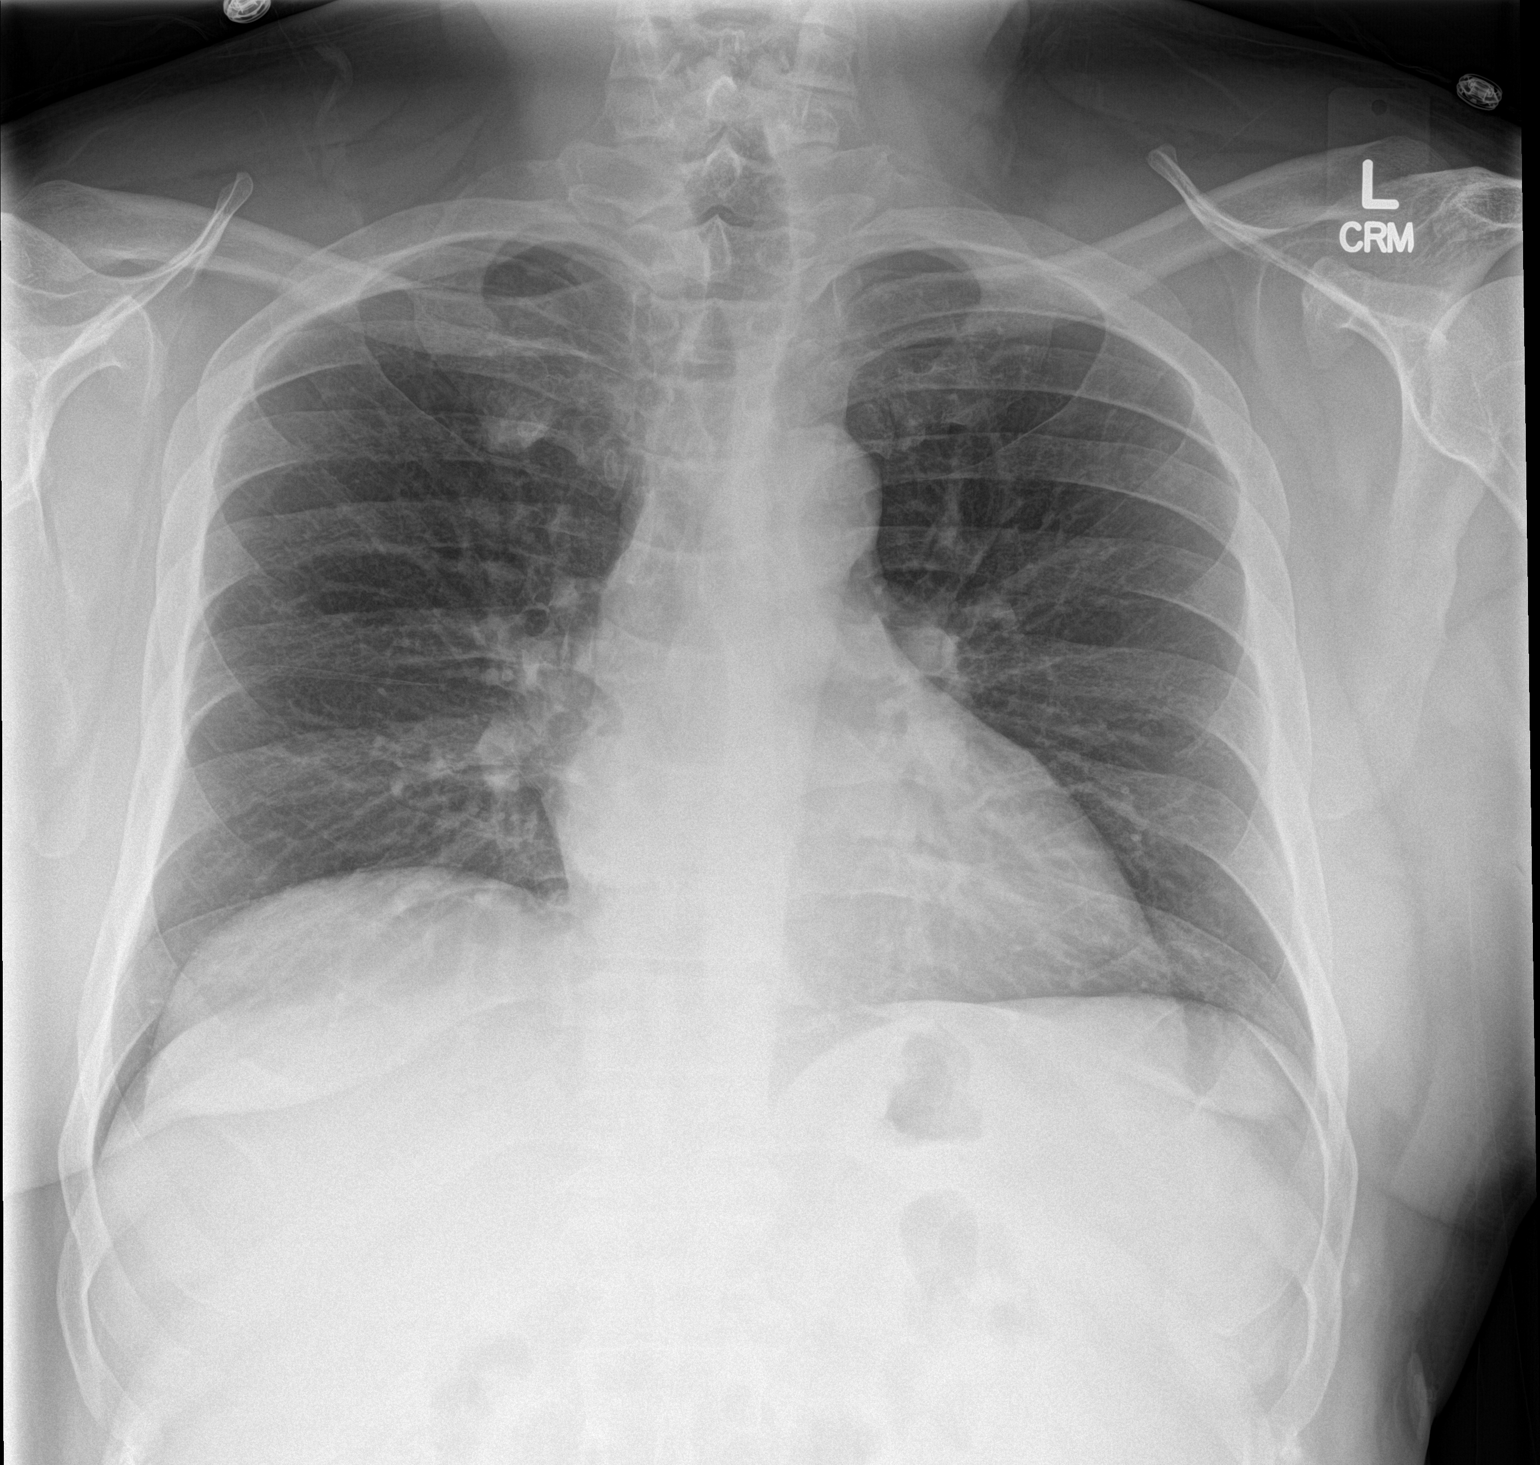

[chest lat]
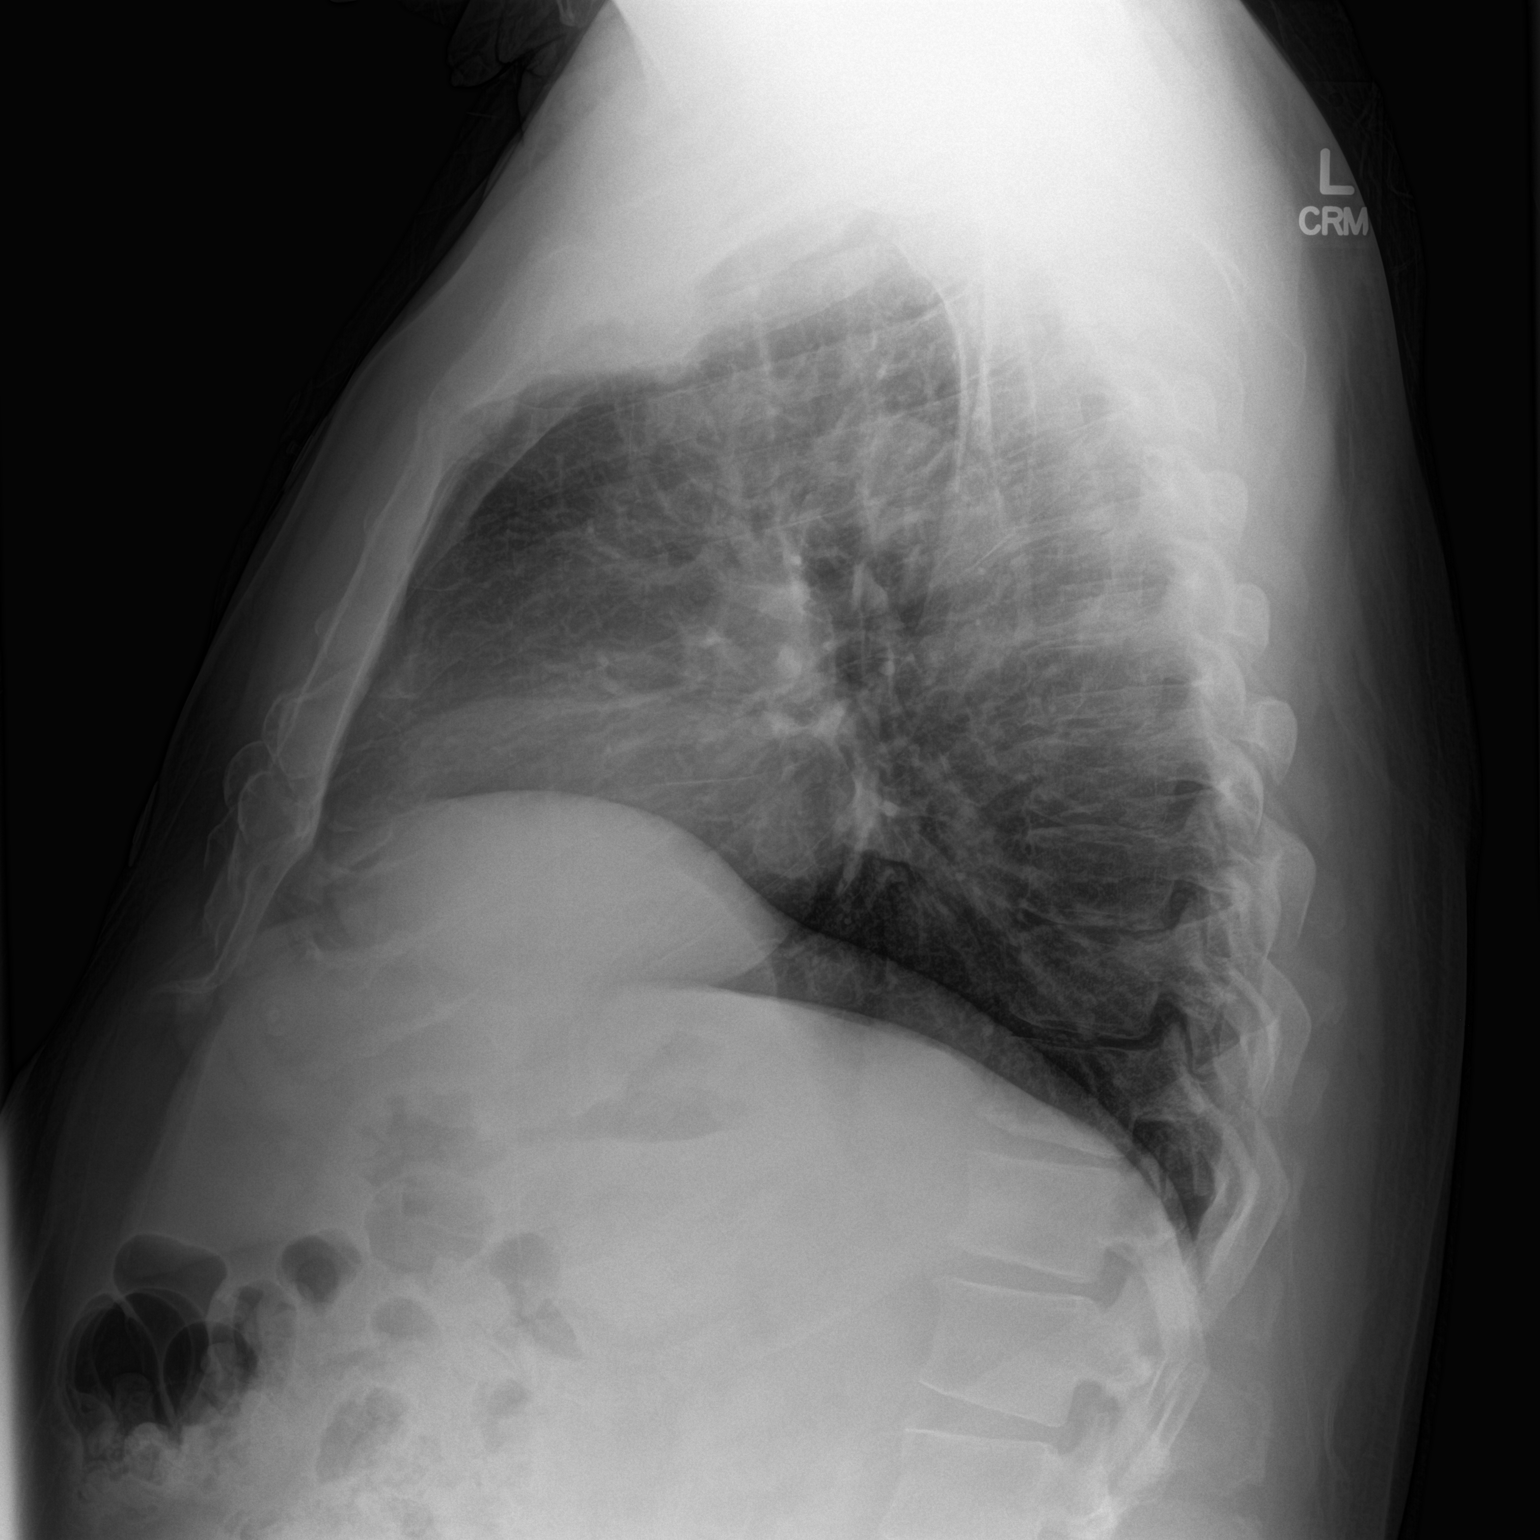

[2 of 2 positions shown; findings below may reference images not displayed]

FINDINGS: The heart size and mediastinal contours are within normal limits.
Both lungs are clear. The visualized skeletal structures are
unremarkable.
IMPRESSION: No active cardiopulmonary disease.

## 2016-10-21 DIAGNOSIS — N2581 Secondary hyperparathyroidism of renal origin: Secondary | ICD-10-CM | POA: Diagnosis not present

## 2016-10-21 DIAGNOSIS — N183 Chronic kidney disease, stage 3 (moderate): Secondary | ICD-10-CM | POA: Diagnosis not present

## 2016-10-21 DIAGNOSIS — I129 Hypertensive chronic kidney disease with stage 1 through stage 4 chronic kidney disease, or unspecified chronic kidney disease: Secondary | ICD-10-CM | POA: Diagnosis not present

## 2016-10-27 ENCOUNTER — Other Ambulatory Visit: Payer: Self-pay | Admitting: Cardiovascular Disease

## 2016-11-03 DIAGNOSIS — Z794 Long term (current) use of insulin: Secondary | ICD-10-CM | POA: Diagnosis not present

## 2016-11-03 DIAGNOSIS — E782 Mixed hyperlipidemia: Secondary | ICD-10-CM | POA: Diagnosis not present

## 2016-11-03 DIAGNOSIS — E1165 Type 2 diabetes mellitus with hyperglycemia: Secondary | ICD-10-CM | POA: Diagnosis not present

## 2016-11-10 DIAGNOSIS — E1122 Type 2 diabetes mellitus with diabetic chronic kidney disease: Secondary | ICD-10-CM | POA: Diagnosis not present

## 2016-11-10 DIAGNOSIS — E1165 Type 2 diabetes mellitus with hyperglycemia: Secondary | ICD-10-CM | POA: Diagnosis not present

## 2016-11-10 DIAGNOSIS — E782 Mixed hyperlipidemia: Secondary | ICD-10-CM | POA: Diagnosis not present

## 2016-11-24 DIAGNOSIS — G4733 Obstructive sleep apnea (adult) (pediatric): Secondary | ICD-10-CM | POA: Diagnosis not present

## 2017-02-14 DIAGNOSIS — I2510 Atherosclerotic heart disease of native coronary artery without angina pectoris: Secondary | ICD-10-CM | POA: Diagnosis not present

## 2017-02-14 DIAGNOSIS — Z794 Long term (current) use of insulin: Secondary | ICD-10-CM | POA: Diagnosis not present

## 2017-02-14 DIAGNOSIS — E1165 Type 2 diabetes mellitus with hyperglycemia: Secondary | ICD-10-CM | POA: Diagnosis not present

## 2017-03-06 DIAGNOSIS — Z0000 Encounter for general adult medical examination without abnormal findings: Secondary | ICD-10-CM | POA: Diagnosis not present

## 2017-03-15 DIAGNOSIS — R972 Elevated prostate specific antigen [PSA]: Secondary | ICD-10-CM | POA: Diagnosis not present

## 2017-03-15 DIAGNOSIS — E1121 Type 2 diabetes mellitus with diabetic nephropathy: Secondary | ICD-10-CM | POA: Diagnosis not present

## 2017-03-15 DIAGNOSIS — N183 Chronic kidney disease, stage 3 (moderate): Secondary | ICD-10-CM | POA: Diagnosis not present

## 2017-03-15 DIAGNOSIS — Z0000 Encounter for general adult medical examination without abnormal findings: Secondary | ICD-10-CM | POA: Diagnosis not present

## 2017-03-16 DIAGNOSIS — Z1212 Encounter for screening for malignant neoplasm of rectum: Secondary | ICD-10-CM | POA: Diagnosis not present

## 2017-03-21 ENCOUNTER — Encounter: Payer: Self-pay | Admitting: Cardiology

## 2017-03-21 ENCOUNTER — Telehealth: Payer: Self-pay | Admitting: Cardiovascular Disease

## 2017-03-21 NOTE — Telephone Encounter (Signed)
Will check with Dr. Angelena Form to hold plavix - last PCI was 2016.  Pt needs colonoscopy,

## 2017-03-21 NOTE — Telephone Encounter (Signed)
New Message     [facility] Pre-operative Risk Assessment    Request for surgical clearance:  1. What type of surgery is being performed? colonscopy  2. When is this surgery scheduled? 11/20  3. Are there any medications that need to be held prior to surgery and how long? Plavix 5 days prior  4. Practice name and name of physician performing surgery? [facility] Dr. Alvester Chou  5. What is your office phone and fax number? [phone] fax [phone]  6. Anesthesia type (None, local, MAC, general) ? Propofol   Hayden Williams 03/21/2017, 12:59 PM  _________________________________________________________________   (provider comments below)

## 2017-03-22 NOTE — Telephone Encounter (Signed)
OK to hold Plavix.   Christopher McAlhany  

## 2017-03-24 ENCOUNTER — Telehealth: Payer: Self-pay | Admitting: Cardiovascular Disease

## 2017-03-24 NOTE — Telephone Encounter (Signed)
Spoke with Leda Gauze at Dr. Alvester Chou (gastroenterolgy) office. They did not receive clearance for patient for procedure on 11/20. Lattie Haw that patient was cleared to hold plavix.   Clearance note routed via Epic to fax: [phone] per Leda Gauze

## 2017-03-28 DIAGNOSIS — D120 Benign neoplasm of cecum: Secondary | ICD-10-CM | POA: Diagnosis not present

## 2017-03-28 DIAGNOSIS — Z1211 Encounter for screening for malignant neoplasm of colon: Secondary | ICD-10-CM | POA: Diagnosis not present

## 2017-03-28 DIAGNOSIS — K621 Rectal polyp: Secondary | ICD-10-CM | POA: Diagnosis not present

## 2017-03-29 DIAGNOSIS — I129 Hypertensive chronic kidney disease with stage 1 through stage 4 chronic kidney disease, or unspecified chronic kidney disease: Secondary | ICD-10-CM | POA: Diagnosis not present

## 2017-03-29 DIAGNOSIS — N183 Chronic kidney disease, stage 3 (moderate): Secondary | ICD-10-CM | POA: Diagnosis not present

## 2017-03-29 DIAGNOSIS — D631 Anemia in chronic kidney disease: Secondary | ICD-10-CM | POA: Diagnosis not present

## 2017-04-25 ENCOUNTER — Telehealth: Payer: Self-pay | Admitting: Cardiovascular Disease

## 2017-04-25 NOTE — Telephone Encounter (Signed)
Pt states for several weeks he has had discomfort on the inside of his left elbow, continuous , sometimes goes up to his shoulder, nothing seems to help, including stretching his arm, denies pain like this in the past. Pt does not recall injuring his arm, thought maybe he hit it on something,but it has persisted and he is concerned about it, wants to be sure not his heart.  Pt has been scheduled to see Almyra Deforest, PA tomorrow at the [facility], he denies any other symptoms, including chest pain/shortness of breath, but knows to go to ED if symptoms change.

## 2017-04-25 NOTE — Telephone Encounter (Signed)
New Message  Pt call requesting to speak with RN. Pt is c/o left are pain. Pt states it has been going on for several weeks. Pt would like to be seen soon by Dr. Angelena Form. Please call back to discuss

## 2017-04-26 ENCOUNTER — Ambulatory Visit: Payer: 59 | Admitting: Physician Assistant

## 2017-04-26 ENCOUNTER — Encounter: Payer: Self-pay | Admitting: Physician Assistant

## 2017-04-26 VITALS — BP 129/90 | HR 79 | Ht 71.0 in | Wt 215.0 lb

## 2017-04-26 DIAGNOSIS — M79602 Pain in left arm: Secondary | ICD-10-CM

## 2017-04-26 DIAGNOSIS — E785 Hyperlipidemia, unspecified: Secondary | ICD-10-CM | POA: Diagnosis not present

## 2017-04-26 DIAGNOSIS — E119 Type 2 diabetes mellitus without complications: Secondary | ICD-10-CM

## 2017-04-26 DIAGNOSIS — Z794 Long term (current) use of insulin: Secondary | ICD-10-CM | POA: Diagnosis not present

## 2017-04-26 DIAGNOSIS — I10 Essential (primary) hypertension: Secondary | ICD-10-CM | POA: Diagnosis not present

## 2017-04-26 DIAGNOSIS — N183 Chronic kidney disease, stage 3 unspecified: Secondary | ICD-10-CM

## 2017-04-26 DIAGNOSIS — I2510 Atherosclerotic heart disease of native coronary artery without angina pectoris: Secondary | ICD-10-CM

## 2017-04-26 NOTE — Progress Notes (Signed)
Cardiology Office Note    Date:  04/26/2017   ID:  Hayden Williams, DOB 1955/07/03, MRN [id number]  PCP:  Hayden Hatchet, MD  Cardiologist:  Dr. Angelena Form   Chief Complaint  Patient presents with  . Follow-up    pt reports significant left antecubital pain x~1.5-2 months. occas radiation up arm and into base of the neck.     History of Present Illness:  Hayden Williams is a 61 y.o. male with PMH of CAD, HTN, HLD, DM II, and stage III CKD.  He had a DES to distal RCA in July 2016, there is 50% proximal LAD, 60% distal LAD, 40% ramus intermedius residual.  He was placed on CPAP therapy for obstructive sleep apnea in 2017.  This is being managed by Dr. Rexene Williams.  More recently, patient was cleared for colonoscopy.  However he contacted the cardiology office complaining of persistent elbow pain for the past several weeks.  He presents today for reassessment.  For the past month and half, he has been having worsening left elbow pain.  His elbow pain seems to be more persistent in nature, and it does not have strong correlation with exertion.  He also changed her job recently and has been more sedentary.  His EKG does not show any significant changes to suggest any ischemia.  A week ago he was able to shovel snow for 2-3 hours without any exertional chest pain or shortness of breath which is his previous anginal symptom.  Given the atypical nature of his symptoms, I recommend a plain old treadmill test to further assess.  As long as he does not have any chest pain and no EKG changes, I do not think we need to do any additional workup.   Past Medical History:  Diagnosis Date  . CAD (coronary artery disease)    a. 11/2014 Cath/PCI: LM nl, LAD 50p, 60d, RI 40, LCX small, OM1 40, OM2 40, RCA 95d (2.25x20 Promus Premier DES), EF nl.  . CKD (chronic kidney disease), stage III ([place])   . Diabetes mellitus without complication ([place])   . Essential hypertension   . Hyperlipidemia     Past Surgical  History:  Procedure Laterality Date  . ANKLE SURGERY Right   . CARDIAC CATHETERIZATION N/A 12/02/2014   Procedure: Left Heart Cath and Coronary Angiography;  Surgeon: Burnell Blanks, MD;  Location: [facility];  Service: Cardiovascular;  Laterality: N/A;  . CARDIAC CATHETERIZATION N/A 12/02/2014   Procedure: Coronary Stent Intervention;  Surgeon: Burnell Blanks, MD;  Location: [facility];  Service: Cardiovascular;  Laterality: N/A;  . CATARACT EXTRACTION    . EYE SURGERY    . INTRAOCULAR PROSTHESES INSERTION      Current Medications: Outpatient Medications Prior to Visit  Medication Sig Dispense Refill  . amLODipine (NORVASC) 10 MG tablet Take 10 mg by mouth daily.  3  . aspirin 81 MG chewable tablet Chew 1 tablet (81 mg total) by mouth daily.    Marland Kitchen atorvastatin (LIPITOR) 80 MG tablet TAKE 1 TABLET BY MOUTH EVERY DAY 90 tablet 3  . Cholecalciferol (VITAMIN D) 2000 UNITS CAPS Take 2,000 Units by mouth daily.    . clopidogrel (PLAVIX) 75 MG tablet TAKE 1 TABLET (75 MG TOTAL) BY MOUTH DAILY WITH BREAKFAST. 30 tablet 8  . Insulin Glargine (BASAGLAR KWIKPEN) 100 UNIT/ML SOPN Inject 44 Units into the skin daily.  3  . ketoconazole (NIZORAL) 2 % cream Apply 1 application topically as needed  for irritation. Apply to R foot several times a week    . losartan-hydrochlorothiazide (HYZAAR) 100-25 MG tablet Take 1 tablet by mouth daily.  4  . metoprolol (LOPRESSOR) 100 MG tablet TAKE 1 TABLET (100 MG TOTAL) BY MOUTH 2 (TWO) TIMES DAILY. 180 tablet 3  . MITIGARE 0.6 MG CAPS Take 1 capsule by mouth as needed (for gout flare).     . neomycin-bacitracin-polymyxin (NEOSPORIN) ointment Apply 1 application topically as needed for wound care (R foot). apply to eye    . nitroGLYCERIN (NITROSTAT) 0.4 MG SL tablet PLACE 1 TABLET UNDER THE TONGUE EVERY 5 MINUTES AS NEEDED FOR CHEST PAIN 25 tablet 2  . NOVOLOG FLEXPEN 100 UNIT/ML FlexPen Use as directed per sliding scale.    . Podiatric  Products (GOLD BOND FOOT) CREA Apply 1 application topically daily as needed (cracked feet).     Hayden Williams 18 MG/3ML SOPN Inject 1.8 mg into the skin daily.     Marland Kitchen VITAMIN A PO Take 10,000 Units by mouth daily.     Hayden Williams SOLOSTAR 300 UNIT/ML SOPN Inject 45 Units into the skin daily.   1   No facility-administered medications prior to visit.      Allergies:   Peanut-containing drug products and Lisinopril   Social History   Socioeconomic History  . Marital status: Married    Spouse name: [place]  . Number of children: 3  . Years of education: MBA  . Highest education level: None  Social Needs  . Financial resource strain: None  . Food insecurity - worry: None  . Food insecurity - inability: None  . Transportation needs - medical: None  . Transportation needs - non-medical: None  Occupational History  . None  Tobacco Use  . Smoking status: Never Smoker  . Smokeless tobacco: Never Used  Substance and Sexual Activity  . Alcohol use: No    Alcohol/week: 0.0 oz  . Drug use: No  . Sexual activity: None  Other Topics Concern  . None  Social History Narrative   Drinks caffeien 4-5 times a month      Family History:  The patient's family history includes Cancer in his brother and father; Diabetes in his brother, father, mother, and sister; Heart disease in his mother; Sleep apnea in his mother.   ROS:   Please see the history of present illness.    ROS All other systems reviewed and are negative.   PHYSICAL EXAM:   VS:  BP 129/90   Pulse 79   Ht 5\' 11"  (1.803 m)   Wt 215 lb (97.5 kg)   SpO2 94%   BMI 29.99 kg/m    GEN: Well nourished, well developed, in no acute distress  HEENT: normal  Neck: no JVD, carotid bruits, or masses Cardiac: RRR; no murmurs, rubs, or gallops,no edema  Respiratory:  clear to auscultation bilaterally, normal work of breathing GI: soft, nontender, nondistended, + BS MS: no deformity or atrophy  Skin: warm and dry, no rash Neuro:  Alert  and Oriented x 3, Strength and sensation are intact Psych: euthymic mood, full affect  Wt Readings from Last 3 Encounters:  04/26/17 215 lb (97.5 kg)  07/08/16 215 lb 12.8 oz (97.9 kg)  12/28/15 214 lb (97.1 kg)      Studies/Labs Reviewed:   EKG:  EKG is ordered today.  The ekg ordered today demonstrates normal sinus rhythm, no significant ST-T wave changes  Recent Labs: No results found for requested labs within last  8760 hours.   Lipid Panel    Component Value Date/Time   CHOL 153 09/11/2015 0934   TRIG 128 09/11/2015 0934   HDL 35 (L) 09/11/2015 0934   CHOLHDL 4.4 09/11/2015 0934   VLDL 26 09/11/2015 0934   LDLCALC 92 09/11/2015 0934    Additional studies/ records that were reviewed today include:   Cath 12/02/2014 Conclusion    Post Atrio lesion, 50% stenosed.  1st Mrg lesion, 40% stenosed.  2nd Mrg lesion, 40% stenosed.  Ramus lesion, 40% stenosed.  Prox LAD to Mid LAD lesion, 50% stenosed.  Dist LAD lesion, 60% stenosed.  Dist RCA lesion, 95% stenosed. There is a 0% residual stenosis post intervention.  A drug-eluting stent was placed.  The left ventricular systolic function is normal.   1. Severe stenosis distal RCA, NSTEMI culprit lesion 2. Successful PTCA/DES x 1 distal RCA 3. Diffuse small vessel disease in the small distal LAD and small obtuse marginal branches.  4. Normal LV systolic function  Recommendations: Will continue ASA and Plavix for at least one year. Will start statin and beta blocker.     ASSESSMENT:    1. Left arm pain   2. Coronary artery disease involving native coronary artery of native heart without angina pectoris   3. Hypertension, essential   4. Hyperlipidemia, unspecified hyperlipidemia type   5. Controlled type 2 diabetes mellitus without complication, with long-term current use of insulin ([place])   6. CKD (chronic kidney disease), stage III ([facility])      PLAN:  In order of problems listed above:  1. L arm pain:  Does not appears to be his typical anginal symptoms.  He denies any recent exertional chest pain or shortness of breath.  I recommended a plain old treadmill test.  Suspicion for significant restenosis fairly low.  He does have known moderate disease in the LAD territory.  2. CAD: Underwent PCI of distal RCA in 2016, moderate disease in LAD and ramus.  3. Hypertension: Blood pressure well controlled on losartan-HCTZ and metoprolol.  4. Hyperlipidemia: According to the patient, he recently had lipid panel done by his primary care provider.  Continue Lipitor 80 mg daily  5. DM 2: On insulin  6. CKD stage III: We will defer monitoring of kidney function to his primary care provider.  I have not seen any basic metabolic panel resolved since 2017.    Medication Adjustments/Labs and Tests Ordered: Current medicines are reviewed at length with the patient today.  Concerns regarding medicines are outlined above.  Medication changes, Labs and Tests ordered today are listed in the Patient Instructions below. Patient Instructions  Medication Instructions:   No changes  Labwork:   None  Testing/Procedures:  Your physician has requested that you have an exercise tolerance test. For further information please visit [email or web address]. Please also follow instruction sheet, as given.   Follow-Up:  With Dr. Angelena Form in 6 months   If you need a refill on your cardiac medications before your next appointment, please call your pharmacy.      Hilbert Corrigan, [place]  04/26/2017 1:28 PM    [facility] [address], [place], [place]  [address] Phone: [phone]; Fax: [phone]

## 2017-04-26 NOTE — Patient Instructions (Signed)
Medication Instructions:   No changes  Labwork:   None  Testing/Procedures:  Your physician has requested that you have an exercise tolerance test. For further information please visit [email or web address]. Please also follow instruction sheet, as given.   Follow-Up:  With Dr. Angelena Form in 6 months   If you need a refill on your cardiac medications before your next appointment, please call your pharmacy.

## 2017-05-10 ENCOUNTER — Telehealth (HOSPITAL_COMMUNITY): Payer: Self-pay

## 2017-05-10 NOTE — Telephone Encounter (Signed)
Encounter complete. 

## 2017-05-12 ENCOUNTER — Ambulatory Visit (HOSPITAL_COMMUNITY)
Admission: RE | Admit: 2017-05-12 | Payer: 59 | Source: Ambulatory Visit | Attending: Physician Assistant | Admitting: Physician Assistant

## 2017-05-17 ENCOUNTER — Telehealth (HOSPITAL_COMMUNITY): Payer: Self-pay

## 2017-05-17 NOTE — Telephone Encounter (Signed)
Encounter complete. 

## 2017-05-19 ENCOUNTER — Ambulatory Visit (HOSPITAL_COMMUNITY)
Admission: RE | Admit: 2017-05-19 | Discharge: 2017-05-19 | Disposition: A | Discharge status: Home / Self Care | Payer: 59 | Source: Ambulatory Visit | Attending: Cardiovascular Disease | Admitting: Cardiovascular Disease

## 2017-05-19 ENCOUNTER — Encounter (HOSPITAL_COMMUNITY): Payer: Self-pay | Admitting: *Deleted

## 2017-05-19 DIAGNOSIS — M79602 Pain in left arm: Secondary | ICD-10-CM

## 2017-05-19 LAB — EXERCISE TOLERANCE TEST
Estimated workload: 10.5 METS
Exercise duration (min): 9 min
Exercise duration (sec): 17 s
MPHR: 159 {beats}/min
Peak HR: 162 {beats}/min
Percent HR: 101 %
RPE: 18
Rest HR: 92 {beats}/min

## 2017-05-19 NOTE — Progress Notes (Unsigned)
Abnormal ETT was reviewed by Dr. Gwenlyn Found. Patient was given the okay to be discharged and go home.

## 2017-05-30 ENCOUNTER — Telehealth: Payer: Self-pay

## 2017-05-30 DIAGNOSIS — I200 Unstable angina: Secondary | ICD-10-CM

## 2017-05-30 NOTE — Telephone Encounter (Deleted)
Information entered in the incorrect chart

## 2017-05-30 NOTE — Telephone Encounter (Signed)
erro  neous encounter

## 2017-05-30 NOTE — Telephone Encounter (Deleted)
Spoke with patient yesterday and told him to come to the office for repeat labs. Patient voiced understanding.

## 2017-06-01 ENCOUNTER — Telehealth: Payer: Self-pay

## 2017-06-01 DIAGNOSIS — R9439 Abnormal result of other cardiovascular function study: Secondary | ICD-10-CM

## 2017-06-01 DIAGNOSIS — R072 Precordial pain: Secondary | ICD-10-CM

## 2017-06-01 NOTE — Telephone Encounter (Signed)
Patient directly notified.  

## 2017-06-01 NOTE — Telephone Encounter (Signed)
-----   Message from [place], [place] sent at 05/22/2017 10:24 AM EST ----- Please inform patient that I have discussed ETT result with Dr. Angelena Form, although the ETT result came back somewhat abnormal, given the atypical nature of his symptom, we recommended further assess using a treadmill myoview in order to ascertain this abnormal EKG changes is truly related to the heart.

## 2017-06-07 NOTE — Progress Notes (Signed)
Can you followup on this to check if treadmill myoview has been ordered.

## 2017-06-15 ENCOUNTER — Telehealth: Payer: Self-pay | Admitting: Physician Assistant

## 2017-06-15 NOTE — Telephone Encounter (Signed)
New Message   Patient is calling to see what his results were from his ETT. He is curious as to why he needs to have a myocardial perfusion. Please call to discuss.

## 2017-06-15 NOTE — Telephone Encounter (Signed)
Called patient back. Informed patient that he is scheduled for a Myoview due to the results of his ETT. Reviewed patient's ETT with him, so he would understanding his results better. Informed patient of instructions for myoview. Patient verbalized understanding and will call with any other questions.

## 2017-06-21 ENCOUNTER — Telehealth (HOSPITAL_COMMUNITY): Payer: Self-pay

## 2017-06-21 NOTE — Telephone Encounter (Signed)
Encounter complete. 

## 2017-06-23 ENCOUNTER — Ambulatory Visit (HOSPITAL_COMMUNITY)
Admission: RE | Admit: 2017-06-23 | Discharge: 2017-06-23 | Disposition: A | Discharge status: Home / Self Care | Payer: 59 | Source: Ambulatory Visit | Attending: Cardiology | Admitting: Cardiology

## 2017-06-23 DIAGNOSIS — I129 Hypertensive chronic kidney disease with stage 1 through stage 4 chronic kidney disease, or unspecified chronic kidney disease: Secondary | ICD-10-CM | POA: Diagnosis not present

## 2017-06-23 DIAGNOSIS — R9439 Abnormal result of other cardiovascular function study: Secondary | ICD-10-CM | POA: Diagnosis not present

## 2017-06-23 DIAGNOSIS — E1122 Type 2 diabetes mellitus with diabetic chronic kidney disease: Secondary | ICD-10-CM | POA: Insufficient documentation

## 2017-06-23 DIAGNOSIS — Z794 Long term (current) use of insulin: Secondary | ICD-10-CM | POA: Insufficient documentation

## 2017-06-23 DIAGNOSIS — Z8249 Family history of ischemic heart disease and other diseases of the circulatory system: Secondary | ICD-10-CM | POA: Diagnosis not present

## 2017-06-23 DIAGNOSIS — I2510 Atherosclerotic heart disease of native coronary artery without angina pectoris: Secondary | ICD-10-CM | POA: Diagnosis not present

## 2017-06-23 DIAGNOSIS — M79602 Pain in left arm: Secondary | ICD-10-CM | POA: Diagnosis not present

## 2017-06-23 DIAGNOSIS — G4733 Obstructive sleep apnea (adult) (pediatric): Secondary | ICD-10-CM | POA: Insufficient documentation

## 2017-06-23 DIAGNOSIS — N183 Chronic kidney disease, stage 3 (moderate): Secondary | ICD-10-CM | POA: Insufficient documentation

## 2017-06-23 DIAGNOSIS — R072 Precordial pain: Secondary | ICD-10-CM | POA: Insufficient documentation

## 2017-06-23 LAB — MYOCARDIAL PERFUSION IMAGING
Estimated workload: 10.9 METS
Exercise duration (min): 9 min
Exercise duration (sec): 31 s
LV dias vol: 72 mL (ref 62–150)
LV sys vol: 27 mL
MPHR: 159 {beats}/min
Peak HR: 164 {beats}/min
Percent HR: 103 %
RPE: 18
Rest HR: 67 {beats}/min
SDS: 4
SRS: 0
SSS: 4
TID: 1.04

## 2017-06-23 MED ORDER — TECHNETIUM TC 99M TETROFOSMIN IV KIT
30.0000 | PACK | Freq: Once | INTRAVENOUS | Status: AC | PRN
  Administered 2017-06-23: 30 via INTRAVENOUS
  Filled 2017-06-23: qty 30

## 2017-06-23 MED ORDER — TECHNETIUM TC 99M TETROFOSMIN IV KIT
10.9000 | PACK | Freq: Once | INTRAVENOUS | Status: AC | PRN
  Administered 2017-06-23: 10.9 via INTRAVENOUS
  Filled 2017-06-23: qty 11

## 2017-06-23 NOTE — Telephone Encounter (Signed)
No evidence of recent heart attack or reversible blockage. Normal pumping function. No further workup needed

## 2017-06-27 DIAGNOSIS — N2581 Secondary hyperparathyroidism of renal origin: Secondary | ICD-10-CM | POA: Diagnosis not present

## 2017-06-27 DIAGNOSIS — N183 Chronic kidney disease, stage 3 (moderate): Secondary | ICD-10-CM | POA: Diagnosis not present

## 2017-06-27 DIAGNOSIS — I129 Hypertensive chronic kidney disease with stage 1 through stage 4 chronic kidney disease, or unspecified chronic kidney disease: Secondary | ICD-10-CM | POA: Diagnosis not present

## 2017-06-27 DIAGNOSIS — D631 Anemia in chronic kidney disease: Secondary | ICD-10-CM | POA: Diagnosis not present

## 2017-07-08 ENCOUNTER — Other Ambulatory Visit: Payer: Self-pay | Admitting: Cardiovascular Disease

## 2017-09-11 ENCOUNTER — Other Ambulatory Visit: Payer: Self-pay | Admitting: Cardiovascular Disease

## 2017-09-12 ENCOUNTER — Other Ambulatory Visit: Payer: Self-pay | Admitting: Cardiovascular Disease

## 2017-12-12 ENCOUNTER — Other Ambulatory Visit: Payer: Self-pay | Admitting: Cardiovascular Disease

## 2018-05-24 ENCOUNTER — Other Ambulatory Visit: Payer: Self-pay | Admitting: Cardiovascular Disease

## 2018-05-26 ENCOUNTER — Other Ambulatory Visit: Payer: Self-pay | Admitting: Cardiovascular Disease

## 2018-06-09 DIAGNOSIS — K922 Gastrointestinal hemorrhage, unspecified: Secondary | ICD-10-CM

## 2018-06-09 HISTORY — DX: Gastrointestinal hemorrhage, unspecified: K92.2

## 2018-06-19 ENCOUNTER — Telehealth: Payer: Self-pay | Admitting: Cardiovascular Disease

## 2018-06-19 MED ORDER — CLOPIDOGREL BISULFATE 75 MG PO TABS: 75.0000 mg | ORAL_TABLET | Freq: Every day | ORAL | 0 refills | Status: DC

## 2018-06-19 NOTE — Telephone Encounter (Signed)
Pt's medication was sent to pt's pharmacy as requested. Confirmation received.  °

## 2018-06-19 NOTE — Telephone Encounter (Signed)
New message      *STAT* If patient is at the pharmacy, call can be transferred to refill team.   1. Which medications need to be refilled? (please list name of each medication and dose if known) clopidogrel (PLAVIX) 75 MG tablet  2. Which pharmacy/location (including street and city if local pharmacy) is medication to be sent to? [id number] - [place],  - [address]  3. Do they need a 30 day or 90 day supply? 90  

## 2018-06-19 NOTE — Telephone Encounter (Signed)
Routed incorrectly rerouted  correctly

## 2018-06-25 ENCOUNTER — Telehealth: Payer: Self-pay | Admitting: Cardiovascular Disease

## 2018-06-25 ENCOUNTER — Other Ambulatory Visit: Payer: Self-pay

## 2018-06-25 ENCOUNTER — Observation Stay (HOSPITAL_COMMUNITY)
Admission: EM | Admit: 2018-06-25 | Discharge: 2018-06-27 | Disposition: A | Discharge status: Home / Self Care | Payer: 59 | Attending: Internal Medicine | Admitting: Internal Medicine

## 2018-06-25 ENCOUNTER — Encounter (HOSPITAL_COMMUNITY): Payer: Self-pay | Admitting: Emergency Medicine

## 2018-06-25 ENCOUNTER — Emergency Department (HOSPITAL_COMMUNITY): Payer: 59

## 2018-06-25 DIAGNOSIS — M109 Gout, unspecified: Secondary | ICD-10-CM | POA: Diagnosis not present

## 2018-06-25 DIAGNOSIS — R0602 Shortness of breath: Secondary | ICD-10-CM

## 2018-06-25 DIAGNOSIS — Z7982 Long term (current) use of aspirin: Secondary | ICD-10-CM | POA: Diagnosis not present

## 2018-06-25 DIAGNOSIS — R072 Precordial pain: Secondary | ICD-10-CM | POA: Diagnosis not present

## 2018-06-25 DIAGNOSIS — D649 Anemia, unspecified: Secondary | ICD-10-CM | POA: Diagnosis not present

## 2018-06-25 DIAGNOSIS — N179 Acute kidney failure, unspecified: Secondary | ICD-10-CM | POA: Insufficient documentation

## 2018-06-25 DIAGNOSIS — Z79899 Other long term (current) drug therapy: Secondary | ICD-10-CM | POA: Diagnosis not present

## 2018-06-25 DIAGNOSIS — Z7902 Long term (current) use of antithrombotics/antiplatelets: Secondary | ICD-10-CM | POA: Diagnosis not present

## 2018-06-25 DIAGNOSIS — I2510 Atherosclerotic heart disease of native coronary artery without angina pectoris: Secondary | ICD-10-CM | POA: Diagnosis present

## 2018-06-25 DIAGNOSIS — E1129 Type 2 diabetes mellitus with other diabetic kidney complication: Secondary | ICD-10-CM | POA: Diagnosis present

## 2018-06-25 DIAGNOSIS — E1122 Type 2 diabetes mellitus with diabetic chronic kidney disease: Secondary | ICD-10-CM | POA: Diagnosis not present

## 2018-06-25 DIAGNOSIS — R7989 Other specified abnormal findings of blood chemistry: Secondary | ICD-10-CM | POA: Diagnosis present

## 2018-06-25 DIAGNOSIS — K2210 Ulcer of esophagus without bleeding: Secondary | ICD-10-CM | POA: Diagnosis not present

## 2018-06-25 DIAGNOSIS — I10 Essential (primary) hypertension: Secondary | ICD-10-CM | POA: Diagnosis present

## 2018-06-25 DIAGNOSIS — Z955 Presence of coronary angioplasty implant and graft: Secondary | ICD-10-CM | POA: Insufficient documentation

## 2018-06-25 DIAGNOSIS — I129 Hypertensive chronic kidney disease with stage 1 through stage 4 chronic kidney disease, or unspecified chronic kidney disease: Secondary | ICD-10-CM | POA: Insufficient documentation

## 2018-06-25 DIAGNOSIS — Z888 Allergy status to other drugs, medicaments and biological substances status: Secondary | ICD-10-CM | POA: Insufficient documentation

## 2018-06-25 DIAGNOSIS — K922 Gastrointestinal hemorrhage, unspecified: Secondary | ICD-10-CM | POA: Diagnosis present

## 2018-06-25 DIAGNOSIS — Z794 Long term (current) use of insulin: Secondary | ICD-10-CM | POA: Insufficient documentation

## 2018-06-25 DIAGNOSIS — K921 Melena: Secondary | ICD-10-CM

## 2018-06-25 DIAGNOSIS — Z833 Family history of diabetes mellitus: Secondary | ICD-10-CM | POA: Diagnosis not present

## 2018-06-25 DIAGNOSIS — R079 Chest pain, unspecified: Secondary | ICD-10-CM | POA: Diagnosis not present

## 2018-06-25 DIAGNOSIS — N183 Chronic kidney disease, stage 3 unspecified: Secondary | ICD-10-CM | POA: Diagnosis present

## 2018-06-25 DIAGNOSIS — D509 Iron deficiency anemia, unspecified: Secondary | ICD-10-CM | POA: Diagnosis not present

## 2018-06-25 DIAGNOSIS — Z8249 Family history of ischemic heart disease and other diseases of the circulatory system: Secondary | ICD-10-CM | POA: Insufficient documentation

## 2018-06-25 DIAGNOSIS — K449 Diaphragmatic hernia without obstruction or gangrene: Secondary | ICD-10-CM | POA: Insufficient documentation

## 2018-06-25 DIAGNOSIS — R778 Other specified abnormalities of plasma proteins: Secondary | ICD-10-CM | POA: Diagnosis present

## 2018-06-25 DIAGNOSIS — E785 Hyperlipidemia, unspecified: Secondary | ICD-10-CM | POA: Diagnosis not present

## 2018-06-25 DIAGNOSIS — K210 Gastro-esophageal reflux disease with esophagitis: Secondary | ICD-10-CM | POA: Insufficient documentation

## 2018-06-25 DIAGNOSIS — K222 Esophageal obstruction: Secondary | ICD-10-CM | POA: Diagnosis not present

## 2018-06-25 LAB — PROTIME-INR
INR: 0.98
Prothrombin Time: 12.9 seconds (ref 11.4–15.2)

## 2018-06-25 LAB — RETICULOCYTES
Immature Retic Fract: 28.5 % — ABNORMAL HIGH (ref 2.3–15.9)
RBC.: 2.66 MIL/uL — ABNORMAL LOW (ref 4.22–5.81)
Retic Count, Absolute: 172.6 10*3/uL (ref 19.0–186.0)
Retic Ct Pct: 6.5 % — ABNORMAL HIGH (ref 0.4–3.1)

## 2018-06-25 LAB — CBC
HCT: 24.3 % — ABNORMAL LOW (ref 39.0–52.0)
HCT: 24.8 % — ABNORMAL LOW (ref 39.0–52.0)
Hemoglobin: 8.3 g/dL — ABNORMAL LOW (ref 13.0–17.0)
Hemoglobin: 8.2 g/dL — ABNORMAL LOW (ref 13.0–17.0)
MCH: 31.9 pg (ref 26.0–34.0)
MCH: 30.8 pg (ref 26.0–34.0)
MCHC: 34.2 g/dL (ref 30.0–36.0)
MCHC: 33.1 g/dL (ref 30.0–36.0)
MCV: 93.5 fL (ref 80.0–100.0)
MCV: 93.2 fL (ref 80.0–100.0)
Platelets: 293 10*3/uL (ref 150–400)
Platelets: 276 10*3/uL (ref 150–400)
RBC: 2.6 MIL/uL — ABNORMAL LOW (ref 4.22–5.81)
RBC: 2.66 MIL/uL — ABNORMAL LOW (ref 4.22–5.81)
RDW: 14 % (ref 11.5–15.5)
RDW: 14 % (ref 11.5–15.5)
WBC: 8.9 10*3/uL (ref 4.0–10.5)
WBC: 7.7 10*3/uL (ref 4.0–10.5)
nRBC: 0.5 % — ABNORMAL HIGH (ref 0.0–0.2)
nRBC: 0.4 % — ABNORMAL HIGH (ref 0.0–0.2)

## 2018-06-25 LAB — BASIC METABOLIC PANEL
Anion gap: 12 (ref 5–15)
BUN: 41 mg/dL — ABNORMAL HIGH (ref 8–23)
CO2: 22 mmol/L (ref 22–32)
Calcium: 8.6 mg/dL — ABNORMAL LOW (ref 8.9–10.3)
Chloride: 105 mmol/L (ref 98–111)
Creatinine, Ser: 2.64 mg/dL — ABNORMAL HIGH (ref 0.61–1.24)
GFR calc Af Amer: 29 mL/min — ABNORMAL LOW (ref >60)
GFR calc non Af Amer: 25 mL/min — ABNORMAL LOW (ref >60)
Glucose, Bld: 198 mg/dL — ABNORMAL HIGH (ref 70–99)
Potassium: 3.5 mmol/L (ref 3.5–5.1)
Sodium: 139 mmol/L (ref 135–145)

## 2018-06-25 LAB — I-STAT TROPONIN, ED: Troponin i, poc: 0.06 ng/mL (ref 0.00–0.08)

## 2018-06-25 LAB — POC OCCULT BLOOD, ED: Fecal Occult Bld: POSITIVE — AB

## 2018-06-25 LAB — LACTATE DEHYDROGENASE: LDH: 219 U/L — ABNORMAL HIGH (ref 98–192)

## 2018-06-25 MED ORDER — SODIUM CHLORIDE 0.9 % IV BOLUS
500.0000 mL | Freq: Once | INTRAVENOUS | Status: AC
  Administered 2018-06-26: 500 mL via INTRAVENOUS

## 2018-06-25 MED ORDER — SODIUM CHLORIDE 0.9% FLUSH
3.0000 mL | Freq: Once | INTRAVENOUS | Status: AC
  Administered 2018-06-26: 3 mL via INTRAVENOUS

## 2018-06-25 MED ORDER — MORPHINE SULFATE (PF) 2 MG/ML IV SOLN: 2.0000 mg | INTRAVENOUS | Status: DC | PRN

## 2018-06-25 MED ORDER — PANTOPRAZOLE SODIUM 40 MG IV SOLR: 40.0000 mg | Freq: Two times a day (BID) | INTRAVENOUS | Status: DC

## 2018-06-25 MED ORDER — SODIUM CHLORIDE 0.9 % IV SOLN
80.0000 mg | Freq: Once | INTRAVENOUS | Status: AC
  Administered 2018-06-26: 80 mg via INTRAVENOUS
  Filled 2018-06-25: qty 80

## 2018-06-25 MED ORDER — LOSARTAN POTASSIUM-HCTZ 100-25 MG PO TABS: 1.0000 | ORAL_TABLET | Freq: Every day | ORAL | Status: DC

## 2018-06-25 MED ORDER — INSULIN GLARGINE 100 UNIT/ML ~~LOC~~ SOLN
30.0000 [IU] | Freq: Every day | SUBCUTANEOUS | Status: DC
  Administered 2018-06-26: 30 [IU] via SUBCUTANEOUS
  Filled 2018-06-25: qty 0.3

## 2018-06-25 MED ORDER — NITROGLYCERIN 0.4 MG SL SUBL: 0.4000 mg | SUBLINGUAL_TABLET | SUBLINGUAL | Status: DC | PRN

## 2018-06-25 MED ORDER — VITAMIN A 10000 UNITS PO CAPS
10000.0000 [IU] | ORAL_CAPSULE | Freq: Every day | ORAL | Status: DC
  Filled 2018-06-25: qty 1

## 2018-06-25 MED ORDER — HYDRALAZINE HCL 20 MG/ML IJ SOLN: 5.0000 mg | INTRAMUSCULAR | Status: DC | PRN

## 2018-06-25 MED ORDER — ATORVASTATIN CALCIUM 80 MG PO TABS
80.0000 mg | ORAL_TABLET | Freq: Every day | ORAL | Status: DC
  Administered 2018-06-26 – 2018-06-27 (×2): 80 mg via ORAL
  Filled 2018-06-25 (×2): qty 1

## 2018-06-25 MED ORDER — SODIUM CHLORIDE 0.9 % IV SOLN
INTRAVENOUS | Status: DC
  Administered 2018-06-26 (×3): via INTRAVENOUS

## 2018-06-25 MED ORDER — SODIUM CHLORIDE 0.9 % IV SOLN
8.0000 mg/h | INTRAVENOUS | Status: DC
  Administered 2018-06-26 – 2018-06-27 (×3): 8 mg/h via INTRAVENOUS
  Filled 2018-06-25 (×5): qty 80

## 2018-06-25 MED ORDER — ONDANSETRON HCL 4 MG/2ML IJ SOLN: 4.0000 mg | Freq: Four times a day (QID) | INTRAMUSCULAR | Status: DC | PRN

## 2018-06-25 MED ORDER — AMLODIPINE BESYLATE 10 MG PO TABS
10.0000 mg | ORAL_TABLET | Freq: Every day | ORAL | Status: DC
  Administered 2018-06-26 (×2): 10 mg via ORAL
  Filled 2018-06-25: qty 1
  Filled 2018-06-25: qty 2

## 2018-06-25 MED ORDER — INSULIN ASPART 100 UNIT/ML ~~LOC~~ SOLN
0.0000 [IU] | Freq: Three times a day (TID) | SUBCUTANEOUS | Status: DC
  Administered 2018-06-26: 1 [IU] via SUBCUTANEOUS

## 2018-06-25 MED ORDER — ACETAMINOPHEN 325 MG PO TABS: 650.0000 mg | ORAL_TABLET | ORAL | Status: DC | PRN

## 2018-06-25 MED ORDER — METOPROLOL TARTRATE 100 MG PO TABS
100.0000 mg | ORAL_TABLET | Freq: Two times a day (BID) | ORAL | Status: DC
  Administered 2018-06-26 – 2018-06-27 (×4): 100 mg via ORAL
  Filled 2018-06-25 (×4): qty 1

## 2018-06-25 NOTE — ED Provider Notes (Signed)
[facility] EMERGENCY DEPARTMENT Provider Note   CSN: [id number] Arrival date & time: 06/25/18  1540    History   Chief Complaint Chief Complaint  Patient presents with  . Chest Pain    HPI Hayden Williams is a 63 y.o. male.      Shortness of Breath  Severity:  Moderate Duration:  1 week Timing:  Intermittent Progression:  Waxing and waning Chronicity:  New Context: not pollens and not URI   Relieved by:  Rest Worsened by:  Activity Ineffective treatments:  None tried Associated symptoms: chest pain (Tightness during the episodes)   Associated symptoms: no abdominal pain, no cough, no ear pain, no fever, no hemoptysis, no rash, no sore throat and no vomiting   Associated symptoms comment:  Dark stools for the last week Risk factors: no hx of PE/DVT and no obesity     Past Medical History:  Diagnosis Date  . CAD (coronary artery disease)    a. 11/2014 Cath/PCI: LM nl, LAD 50p, 60d, RI 40, LCX small, OM1 40, OM2 40, RCA 95d (2.25x20 Promus Premier DES), EF nl.  . CKD (chronic kidney disease), stage III ([place])   . Diabetes mellitus without complication ([place])   . Essential hypertension   . Hyperlipidemia     Patient Active Problem List   Diagnosis Date Noted  . GIB (gastrointestinal bleeding) 06/25/2018  . Chest pain 06/25/2018  . Symptomatic anemia 06/25/2018  . Essential hypertension   . CAD (coronary artery disease) 12/03/2014  . Acute coronary syndrome ([place]) 12/03/2014  . Precordial chest pain 12/02/2014  . CKD (chronic kidney disease) stage 3, GFR 30-59 ml/min ([facility]) 12/02/2014  . Unstable angina ([place]) 12/02/2014  . Type II diabetes mellitus with renal manifestations ([place]) 01/13/2013    Past Surgical History:  Procedure Laterality Date  . ANKLE SURGERY Right   . CARDIAC CATHETERIZATION N/A 12/02/2014   Procedure: Left Heart Cath and Coronary Angiography;  Surgeon: Burnell Blanks, MD;  Location: [facility];  Service:  Cardiovascular;  Laterality: N/A;  . CARDIAC CATHETERIZATION N/A 12/02/2014   Procedure: Coronary Stent Intervention;  Surgeon: Burnell Blanks, MD;  Location: [facility];  Service: Cardiovascular;  Laterality: N/A;  . CATARACT EXTRACTION    . CORONARY ANGIOPLASTY WITH STENT PLACEMENT    . EYE SURGERY    . INTRAOCULAR PROSTHESES INSERTION          Home Medications    Prior to Admission medications   Medication Sig Start Date End Date Taking? Authorizing Provider  amLODipine (NORVASC) 10 MG tablet Take 10 mg by mouth at bedtime.  02/23/15  Yes [provider]  aspirin 81 MG chewable tablet Chew 1 tablet (81 mg total) by mouth daily. 12/03/14  Yes Theora Gianotti, NP  atorvastatin (LIPITOR) 80 MG tablet TAKE 1 TABLET BY MOUTH EVERY DAY Patient taking differently: Take 80 mg by mouth daily.  09/11/17  Yes Burnell Blanks, MD  clopidogrel (PLAVIX) 75 MG tablet Take 1 tablet (75 mg total) by mouth daily. Please keep upcoming appt in April with Dr. Angelena Form for future refills. Thank you Patient taking differently: Take 75 mg by mouth daily.  06/19/18  Yes Burnell Blanks, MD  Continuous Blood Gluc Sensor (DEXCOM G6 SENSOR) MISC 1 patch See admin instructions. Place 1 new sensor every 10 days to obtain continuous glucose readings 02/01/18  Yes [provider]  Insulin Glargine (BASAGLAR KWIKPEN) 100 UNIT/ML SOPN Inject 44 Units into  the skin daily after supper.  03/22/17  Yes [provider]  ketoconazole (NIZORAL) 2 % cream Apply 1 application topically as needed (to right foot several times a week for irritation).    Yes [provider]  losartan-hydrochlorothiazide (HYZAAR) 100-25 MG tablet Take 1 tablet by mouth daily. 11/27/15  Yes [provider]  metoprolol tartrate (LOPRESSOR) 100 MG tablet Take 1 tablet (100 mg total) by mouth 2 (two) times daily. Please schedule overdue appt for future refills. 2nd  attempt. Patient taking differently: Take 100 mg by mouth 2 (two) times daily.  05/30/18  Yes Burnell Blanks, MD  MITIGARE 0.6 MG CAPS Take 1 capsule by mouth daily as needed (for gout flares).  12/09/14  Yes [provider]  neomycin-bacitracin-polymyxin (NEOSPORIN) ointment Apply 1 application topically as needed for wound care (of right foot). apply to eye    Yes [provider]  nitroGLYCERIN (NITROSTAT) 0.4 MG SL tablet PLACE 1 TABLET UNDER THE TONGUE EVERY 5 MINUTES AS NEEDED FOR CHEST PAIN Patient taking differently: Place 0.4 mg under the tongue every 5 (five) minutes as needed for chest pain.  04/11/16  Yes Theora Gianotti, NP  NOVOLOG FLEXPEN 100 UNIT/ML FlexPen Inject 2-11 Units into the skin See admin instructions. Inject 2-11 units into the skin two times a day with meals, per sliding scale 06/26/16  Yes [provider]  pioglitazone (ACTOS) 15 MG tablet Take 15 mg by mouth daily. 03/26/18  Yes [provider]  Podiatric Products (GOLD BOND FOOT) CREA Apply 1 application topically daily as needed (cracked feet).    Yes [provider]  VICTOZA 18 MG/3ML SOPN Inject 1.8 mg into the skin daily before lunch.  12/18/15  Yes [provider]  Cholecalciferol (VITAMIN D) 2000 UNITS CAPS Take 2,000 Units by mouth daily.    [provider]  VITAMIN A PO Take 10,000 Units by mouth daily.     [provider]    Family History Family History  Problem Relation Age of Onset  . Diabetes Mother   . Heart disease Mother   . Sleep apnea Mother   . Cancer Father   . Diabetes Father   . Diabetes Sister   . Cancer Brother   . Diabetes Brother     Social History Social History   Tobacco Use  . Smoking status: Never Smoker  . Smokeless tobacco: Never Used  Substance Use Topics  . Alcohol use: No    Alcohol/week: 0.0 standard drinks  . Drug use: No     Allergies   Peanut oil; Peanut-containing drug  products; Lisinopril; and Niacin   Review of Systems Review of Systems  Constitutional: Negative for chills and fever.  HENT: Negative for ear pain and sore throat.   Eyes: Negative for pain and visual disturbance.  Respiratory: Positive for shortness of breath. Negative for cough and hemoptysis.   Cardiovascular: Positive for chest pain (Tightness during the episodes). Negative for palpitations.  Gastrointestinal: Negative for abdominal pain and vomiting.  Genitourinary: Negative for dysuria and hematuria.  Musculoskeletal: Negative for arthralgias and back pain.  Skin: Negative for color change and rash.  Allergic/Immunologic: Negative for immunocompromised state.  Neurological: Negative for seizures and syncope.  All other systems reviewed and are negative.    Physical Exam Updated Vital Signs BP 139/84 (BP Location: Right Arm)   Pulse 80   Temp 98.1 F (36.7 C) (Oral)   Resp 16   SpO2 100%   Physical  Exam Vitals signs and nursing note reviewed.  Constitutional:      Appearance: He is well-developed.     Comments: Patient resting comfortably, no acute distress.  HENT:     Head: Normocephalic and atraumatic.  Eyes:     Extraocular Movements: Extraocular movements intact.     Conjunctiva/sclera: Conjunctivae normal.  Neck:     Musculoskeletal: Neck supple.  Cardiovascular:     Rate and Rhythm: Normal rate and regular rhythm.     Heart sounds: Normal heart sounds. No murmur.  Pulmonary:     Effort: Pulmonary effort is normal. No respiratory distress.     Breath sounds: Normal breath sounds.  Chest:     Chest wall: No tenderness.  Abdominal:     Palpations: Abdomen is soft.     Tenderness: There is no abdominal tenderness.  Genitourinary:    Rectum: Guaiac result positive.  Skin:    General: Skin is warm and dry.  Neurological:     General: No focal deficit present.     Mental Status: He is alert.  Psychiatric:        Attention and Perception: Attention  normal.        Mood and Affect: Mood normal.      ED Treatments / Results  Labs (all labs ordered are listed, but only abnormal results are displayed) Labs Reviewed  BASIC METABOLIC PANEL - Abnormal; Notable for the following components:      Result Value   Glucose, Bld 198 (*)    BUN 41 (*)    Creatinine, Ser 2.64 (*)    Calcium 8.6 (*)    GFR calc non Af Amer 25 (*)    GFR calc Af Amer 29 (*)    All other components within normal limits  CBC - Abnormal; Notable for the following components:   RBC 2.60 (*)    Hemoglobin 8.3 (*)    HCT 24.3 (*)    nRBC 0.5 (*)    All other components within normal limits  LACTATE DEHYDROGENASE - Abnormal; Notable for the following components:   LDH 219 (*)    All other components within normal limits  RETICULOCYTES - Abnormal; Notable for the following components:   Retic Ct Pct 6.5 (*)    RBC. 2.66 (*)    Immature Retic Fract 28.5 (*)    All other components within normal limits  CBC - Abnormal; Notable for the following components:   RBC 2.66 (*)    Hemoglobin 8.2 (*)    HCT 24.8 (*)    nRBC 0.4 (*)    All other components within normal limits  POC OCCULT BLOOD, ED - Abnormal; Notable for the following components:   Fecal Occult Bld POSITIVE (*)    All other components within normal limits  PROTIME-INR  HAPTOGLOBIN  VITAMIN B12  FOLATE  IRON AND TIBC  FERRITIN  CBC  CBC  APTT  HEMOGLOBIN A1C  LIPID PANEL  TROPONIN I  TROPONIN I  TROPONIN I  HIV ANTIBODY (ROUTINE TESTING W REFLEX)  CBC  I-STAT TROPONIN, ED  TYPE AND SCREEN  ABO/RH    EKG None  Radiology Dg Chest 2 View  Result Date: 06/25/2018 CLINICAL DATA:  Chest pain EXAM: CHEST - 2 VIEW COMPARISON:  November 22, 2014 FINDINGS: No edema or consolidation. Heart size and pulmonary vascularity are normal. No adenopathy. No pneumothorax. No bone lesions. IMPRESSION: No edema or consolidation. Electronically Signed   By: Lowella Grip III M.D.  On: 06/25/2018 17:18     Procedures Procedures (including critical care time)  Medications Ordered in ED Medications  sodium chloride flush (NS) 0.9 % injection 3 mL (3 mLs Intravenous Not Given 06/25/18 2058)  pantoprazole (PROTONIX) 80 mg in sodium chloride 0.9 % 100 mL IVPB (has no administration in time range)  pantoprazole (PROTONIX) 80 mg in sodium chloride 0.9 % 250 mL (0.32 mg/mL) infusion (has no administration in time range)  pantoprazole (PROTONIX) injection 40 mg (has no administration in time range)  atorvastatin (LIPITOR) tablet 80 mg (has no administration in time range)  amLODipine (NORVASC) tablet 10 mg (has no administration in time range)  losartan-hydrochlorothiazide (HYZAAR) 100-25 MG per tablet 1 tablet (has no administration in time range)  metoprolol tartrate (LOPRESSOR) tablet 100 mg (has no administration in time range)  nitroGLYCERIN (NITROSTAT) SL tablet 0.4 mg (has no administration in time range)  BASAGLAR KWIKPEN KwikPen 30 Units (has no administration in time range)  vitamin A capsule 10,000 Units (has no administration in time range)  0.9 %  sodium chloride infusion (has no administration in time range)  morphine 2 MG/ML injection 2 mg (has no administration in time range)  sodium chloride 0.9 % bolus 500 mL (has no administration in time range)  acetaminophen (TYLENOL) tablet 650 mg (has no administration in time range)  ondansetron (ZOFRAN) injection 4 mg (has no administration in time range)  hydrALAZINE (APRESOLINE) injection 5 mg (has no administration in time range)  insulin aspart (novoLOG) injection 0-9 Units (has no administration in time range)     Initial Impression / Assessment and Plan / ED Course  I have reviewed the triage vital signs and the nursing notes.  Pertinent labs & imaging results that were available during my care of the patient were reviewed by me and considered in my medical decision making (see chart for details).        MDM   63 y.o.  male with PMH of CAD, HTN, HLD, DM II, and stage III CKD.  He had a DES to distal RCA in July 2016, there is 50% proximal LAD, 60% distal LAD, 40% ramus intermedius residual who presents to the emergency department tonight with continued exertional shortness of breath worsening over the last week.  Patient denies any significant chest pain however during the episodes of shortness of breath on exertion he does feel a tightness in the chest.  Patient denies any infectious-like symptoms, indicates that he is been taking his medications appropriately, denies any other complaints at this time.  Physical exam negative for peripheral edema, tachycardia, or murmurs.  However on further discussion with the patient he indicates that he has had dark stools over the past week.  Patient on exam Hemoccult positive, no hemorrhoids or fissures visualized on exam.  Laboratory studies indicate hemoglobin 8.3, likely from GI bleed.  Initial troponin negative, EKG shows no ST elevation or depression, similar T wave as prior, appropriate intervals.  Patient will be admitted for gastrointestinal bleed as well as continue to follow H&H.  Patient in agreement with this plan.  Patient admitted in stable condition with stable vital signs.  The above care was discussed and agreed upon by my attending physician.  Final Clinical Impressions(s) / ED Diagnoses   Final diagnoses:  Shortness of breath  Gastrointestinal hemorrhage, unspecified gastrointestinal hemorrhage type  Low hemoglobin    ED Discharge Orders    None       Orson Aloe, MD 06/26/18 [phone]  Noemi Chapel, MD 06/26/18 [phone]

## 2018-06-25 NOTE — ED Provider Notes (Signed)
I saw and evaluated the patient, reviewed the resident's note and I agree with the findings and plan.  Pertinent History: the pt presents with black stools for 1 week, he is currently taking a baby aspirin and Plavix secondary to his history of heart disease status post tenting.  Pertinent Exam findings: On exam the patient is well-appearing with a soft nontender abdomen, his vital signs are reassuring.  His stool is Hemoccult positive and his hemoglobin is dropped multiple grams.  He will need to be admitted for significant and symptomatic anemia though he does not need to be acutely transfused in the ER.  I was personally present and directly supervised the following procedures:  Medical rescucitation  I personally interpreted the EKG as well as the resident and agree with the interpretation on the resident's chart.   EKG Interpretation  Date/Time:  Monday June 25 2018 15:48:41 EST Ventricular Rate:  72 PR Interval:  146 QRS Duration: 90 QT Interval:  414 QTC Calculation: 453 R Axis:   75 Text Interpretation:  Normal sinus rhythm Normal ECG Since last tracing rate slower Confirmed by Noemi Chapel [phone]) on 06/26/2018 12:44:05 AM        Final diagnoses:  Shortness of breath  Gastrointestinal hemorrhage, unspecified gastrointestinal hemorrhage type  Low hemoglobin      Noemi Chapel, MD 06/26/18 [phone]

## 2018-06-25 NOTE — H&P (Signed)
History and Physical    Hayden Williams:[id number] DOB: 10/22/1955 DOA: 06/25/2018  Referring MD/NP/PA:   PCP: Velna Hatchet, MD   Patient coming from:  The patient is coming from home.  At baseline, pt is independent for most of ADL.        Chief Complaint: Dark stool, shortness of breath, chest pain.  HPI: Hayden Williams is a 63 y.o. male with medical history significant of hypertension, hyperlipidemia, diabetes mellitus, CAD, stent placement, gout, CKD stage III, who presents with shortness breath and chest pain.  Patient states that he has been having intermittent shortness of breath in the past 3 days, which is exertional. He had one episode of chest pain today, which was also exertional. The chest pain is located in the central chest, 3 out of 10 in severity initially, currently 0 out of 10, nonradiating, pressure-like pain.  No cough, fever or chills.  Patient states that he has been having intermittent dark stool for about 1 week ,He has nausea, no vomiting, diarrhea or abdominal pain.  He states that he had colonoscopy done 2-3 years ago in [place] and had colon polyp removed.  He states that he did not had EGD in the past.  ED Course: pt was found to have hemoglobin dropped from 13.6 on 12/03/14-->8.3, positive FOBT, troponin 0.04, INR 0.98, LDH 27, slightly worsening renal function, temperature normal, no tachycardia, oxygen saturation 100% on room air, chest x-ray negative.  Patient is placed on telemetry bed of observation.  Review of Systems:   General: no fevers, chills, no body weight gain, has fatigue HEENT: no blurry vision, hearing changes or sore throat Respiratory: has dyspnea, no coughing, wheezing CV: has chest pain, no palpitations GI: has nausea and dark stool, no vomiting, abdominal pain, diarrhea, constipation GU: no dysuria, burning on urination, increased urinary frequency, hematuria  Ext: no leg edema Neuro: no unilateral weakness, numbness, or  tingling, no vision change or hearing loss Skin: no rash, no skin tear. MSK: No muscle spasm, no deformity, no limitation of range of movement in spin Heme: No easy bruising.  Travel history: No recent long distant travel.  Allergy:  Allergies  Allergen Reactions  . Peanut Oil Anaphylaxis and Swelling  . Peanut-Containing Drug Products Anaphylaxis and Swelling  . Lisinopril Other (See Comments) and Cough    Increased BUN and creatinine, also    . Niacin Other (See Comments)    Reaction not recalled     Past Medical History:  Diagnosis Date  . CAD (coronary artery disease)    a. 11/2014 Cath/PCI: LM nl, LAD 50p, 60d, RI 40, LCX small, OM1 40, OM2 40, RCA 95d (2.25x20 Promus Premier DES), EF nl.  . CKD (chronic kidney disease), stage III ([place])   . Diabetes mellitus without complication ([place])   . Essential hypertension   . Hyperlipidemia     Past Surgical History:  Procedure Laterality Date  . ANKLE SURGERY Right   . CARDIAC CATHETERIZATION N/A 12/02/2014   Procedure: Left Heart Cath and Coronary Angiography;  Surgeon: Burnell Blanks, MD;  Location: [facility];  Service: Cardiovascular;  Laterality: N/A;  . CARDIAC CATHETERIZATION N/A 12/02/2014   Procedure: Coronary Stent Intervention;  Surgeon: Burnell Blanks, MD;  Location: [facility];  Service: Cardiovascular;  Laterality: N/A;  . CATARACT EXTRACTION    . CORONARY ANGIOPLASTY WITH STENT PLACEMENT    . EYE SURGERY    . INTRAOCULAR PROSTHESES INSERTION  Social History:  reports that he has never smoked. He has never used smokeless tobacco. He reports that he does not drink alcohol or use drugs.  Family History:  Family History  Problem Relation Age of Onset  . Diabetes Mother   . Heart disease Mother   . Sleep apnea Mother   . Cancer Father   . Diabetes Father   . Diabetes Sister   . Cancer Brother   . Diabetes Brother      Prior to Admission medications   Medication Sig Start  Date End Date Taking? Authorizing Provider  amLODipine (NORVASC) 10 MG tablet Take 10 mg by mouth at bedtime.  02/23/15  Yes [provider]  aspirin 81 MG chewable tablet Chew 1 tablet (81 mg total) by mouth daily. 12/03/14  Yes Theora Gianotti, NP  atorvastatin (LIPITOR) 80 MG tablet TAKE 1 TABLET BY MOUTH EVERY DAY Patient taking differently: Take 80 mg by mouth daily.  09/11/17  Yes Burnell Blanks, MD  clopidogrel (PLAVIX) 75 MG tablet Take 1 tablet (75 mg total) by mouth daily. Please keep upcoming appt in April with Dr. Angelena Form for future refills. Thank you Patient taking differently: Take 75 mg by mouth daily.  06/19/18  Yes Burnell Blanks, MD  Continuous Blood Gluc Sensor (DEXCOM G6 SENSOR) MISC 1 patch. USE TO OBTAIN CONTINUOUS GLUCOSE READINGS CHANGE EVERY 10 DAYS 02/01/18  Yes [provider]  Insulin Glargine (BASAGLAR KWIKPEN) 100 UNIT/ML SOPN Inject 44 Units into the skin daily after supper.  03/22/17  Yes [provider]  losartan-hydrochlorothiazide (HYZAAR) 100-25 MG tablet Take 1 tablet by mouth daily. 11/27/15  Yes [provider]  metoprolol tartrate (LOPRESSOR) 100 MG tablet Take 1 tablet (100 mg total) by mouth 2 (two) times daily. Please schedule overdue appt for future refills. 2nd attempt. Patient taking differently: Take 100 mg by mouth 2 (two) times daily.  05/30/18  Yes Burnell Blanks, MD  MITIGARE 0.6 MG CAPS Take 1 capsule by mouth as needed (for gout flare).  12/09/14  Yes [provider]  neomycin-bacitracin-polymyxin (NEOSPORIN) ointment Apply 1 application topically as needed for wound care (R foot). apply to eye   Yes [provider]  nitroGLYCERIN (NITROSTAT) 0.4 MG SL tablet PLACE 1 TABLET UNDER THE TONGUE EVERY 5 MINUTES AS NEEDED FOR CHEST PAIN Patient taking differently: Place 0.4 mg under the tongue every 5 (five) minutes as needed for chest pain.  04/11/16  Yes Theora Gianotti, NP  NOVOLOG FLEXPEN 100 UNIT/ML FlexPen Inject 2-11 Units into the skin 2 (two) times daily with a meal. Use as directed per sliding scale. 06/26/16  Yes [provider]  pioglitazone (ACTOS) 15 MG tablet Take 15 mg by mouth daily. 03/26/18  Yes [provider]  Podiatric Products (GOLD BOND FOOT) CREA Apply 1 application topically daily as needed (cracked feet).    Yes [provider]  VICTOZA 18 MG/3ML SOPN Inject 1.8 mg into the skin daily before lunch.  12/18/15  Yes [provider]  Cholecalciferol (VITAMIN D) 2000 UNITS CAPS Take 2,000 Units by mouth daily.    [provider]  ketoconazole (NIZORAL) 2 % cream Apply 1 application topically as needed for irritation. Apply to R foot several times a week    [provider]  VITAMIN A PO Take 10,000 Units by mouth daily.     [provider]    Physical Exam: Vitals:   06/26/18 0201 06/26/18 0230 06/26/18 2831  06/26/18 0448  BP:  121/82 124/85 126/79  Pulse:  77 78 83  Resp:  15 15 18   Temp:  98 F (36.7 C) (!) 97.5 F (36.4 C) 98.1 F (36.7 C)  TempSrc:  Oral Oral Oral  SpO2:  97% 100% 96%  Weight: 99.7 kg     Height: 5\' 11"  (1.803 m)      General: Not in acute distress.  HEENT:       Eyes: PERRL, EOMI, no scleral icterus.       ENT: No discharge from the ears and nose, no pharynx injection, no tonsillar enlargement.        Neck: No JVD, no bruit, no mass felt. Heme: No neck lymph node enlargement. Cardiac: S1/S2, RRR, No murmurs, No gallops or rubs. Respiratory:  No rales, wheezing, rhonchi or rubs. GI: Soft, nondistended, nontender, no rebound pain, no organomegaly, BS present. GU: No hematuria Ext: No pitting leg edema bilaterally. 2+DP/PT pulse bilaterally. Musculoskeletal: No joint deformities, No joint redness or warmth, no limitation of ROM in spin. Skin: No rashes.  Neuro: Alert, oriented X3, cranial nerves II-XII grossly intact, moves all extremities  normally. Psych: Patient is not psychotic, no suicidal or hemocidal ideation.  Labs on Admission: I have personally reviewed following labs and imaging studies  CBC: Recent Labs  Lab 06/25/18 1557 06/25/18 2327 06/26/18 0524  WBC 8.9 7.7 6.7  HGB 8.3* 8.2* 8.8*  HCT 24.3* 24.8* 25.9*  MCV 93.5 93.2 89.9  PLT 293 276 878   Basic Metabolic Panel: Recent Labs  Lab 06/25/18 1557  NA 139  K 3.5  CL 105  CO2 22  GLUCOSE 198*  BUN 41*  CREATININE 2.64*  CALCIUM 8.6*   GFR: Estimated Creatinine Clearance: 34.9 mL/min (A) (by C-G formula based on SCr of 2.64 mg/dL (H)). Liver Function Tests: No results for input(s): AST, ALT, ALKPHOS, BILITOT, PROT, ALBUMIN in the last 168 hours. No results for input(s): LIPASE, AMYLASE in the last 168 hours. No results for input(s): AMMONIA in the last 168 hours. Coagulation Profile: Recent Labs  Lab 06/25/18 1557  INR 0.98   Cardiac Enzymes: Recent Labs  Lab 06/25/18 2327  TROPONINI 0.04*   BNP (last 3 results) No results for input(s): PROBNP in the last 8760 hours. HbA1C: No results for input(s): HGBA1C in the last 72 hours. CBG: Recent Labs  Lab 06/26/18 0256 06/26/18 0329  GLUCAP 75 76   Lipid Profile: No results for input(s): CHOL, HDL, LDLCALC, TRIG, CHOLHDL, LDLDIRECT in the last 72 hours. Thyroid Function Tests: No results for input(s): TSH, T4TOTAL, FREET4, T3FREE, THYROIDAB in the last 72 hours. Anemia Panel: Recent Labs    06/25/18 2327  VITAMINB12 425  FOLATE 11.7  FERRITIN 29  TIBC 381  IRON 51  RETICCTPCT 6.5*   Urine analysis:    Component Value Date/Time   COLORURINE YELLOW 12/02/2014 [place] 12/02/2014 1219   LABSPEC 1.026 12/02/2014 1219   PHURINE 5.5 12/02/2014 1219   GLUCOSEU 250 (A) 12/02/2014 1219   HGBUR MODERATE (A) 12/02/2014 1219   BILIRUBINUR NEGATIVE 12/02/2014 1219   KETONESUR NEGATIVE 12/02/2014 1219   PROTEINUR >300 (A) 12/02/2014 1219   UROBILINOGEN 0.2  12/02/2014 1219   NITRITE NEGATIVE 12/02/2014 1219   LEUKOCYTESUR NEGATIVE 12/02/2014 1219   Sepsis Labs: @LABRCNTIP (procalcitonin:4,lacticidven:4) )No results found for this or any previous visit (from the past 240 hour(s)).   Radiological Exams on Admission: Dg Chest 2 View  Result Date: 06/25/2018 CLINICAL DATA:  Chest pain EXAM: CHEST - 2 VIEW COMPARISON:  November 22, 2014 FINDINGS: No edema or consolidation. Heart size and pulmonary vascularity are normal. No adenopathy. No pneumothorax. No bone lesions. IMPRESSION: No edema or consolidation. Electronically Signed   By: Lowella Grip III M.D.   On: 06/25/2018 17:18     EKG: Independently reviewed.  Sinus rhythm, QTC 453, early R wave progression, mild ST depression in lead III/aVF.   Assessment/Plan Principal Problem:   Symptomatic anemia Active Problems:   Type II diabetes mellitus with renal manifestations (HCC)   CKD (chronic kidney disease) stage 3, GFR 30-59 ml/min (HCC)   CAD (coronary artery disease)   Essential hypertension   GIB (gastrointestinal bleeding)   Chest pain   Elevated troponin   Symptomatic anemia due to GIB: pt has positive FOBT.  Hemoglobin dropped from 13.6 at 8.3.    - will place in tele bed for obs - transfuse 1 units of blood now - nemia panel - NPO - IVF: 500 mL NS bolus, then at 125 mL/hr - Start IV pantoprazole gtt - Zofran IV for nausea - Avoid NSAIDs and SQ heparin - Maintain IV access (2 large bore IVs if possible). - Monitor closely and follow q6h cbc, transfuse as necessary, if Hgb<7.0 - LaB: INR, PTT and type screen - please call GI in AM  Hx of CAD, chest pain and elevated trop: s/p of stent. trop 0.04. His CP has resolved.  Possibly due to demand ischemia secondary to worsening anemia. - cycle CE q6 x3 and repeat EKG in the am  - prn Nitroglycerin, Morphine, and lipitor  - Hold ASA and plavix - Risk factor stratification: will check FLP and A1C, UDS - did not order 2d  echo--> please reevaluate pt in AM to decide if pt needs 2D echo.  Type II diabetes mellitus with renal manifestations ([place]): Last A1c 11.7 on 11/02/12, poorly controled then. Patient is taking Victoza and glargine insulin at home -will decrease glargine insulin dose from 44 to 30 unit daily -SSI  CKD (chronic kidney disease) stage 3, GFR 30-59 ml/min ([place]): Slightly worsening renal function.  Baseline creatinine 2.28.  His creatinine is at 2.64, BUN 41. -Follow-up renal function by BMP -IV fluid as above  HTN:  -Continue home medications: Metoprolol, amlodipine, -IV hydralazine prn   DVT ppx: SCD Code Status: Full code Family Communication: None at bed side Disposition Plan:  Anticipate discharge back to previous home environment Consults called:  none Admission status: Obs / tele    Date of Service 06/26/2018    [facility]   If 7PM-7AM, please contact night-coverage [email or web address] Password [facility] 06/26/2018, 6:28 AM

## 2018-06-25 NOTE — ED Triage Notes (Signed)
Patient complains of intermittent chest discomfort that he describes as gas since Thursday, had stent placed 2016. Alert and oriented, NAD

## 2018-06-25 NOTE — ED Triage Notes (Addendum)
C/o "heartburn" to center of chest x 30 min that is completely resolved after 3 NTG.  Hx of stent placement.  Reports SOB over the past 2-3 days.  Denies nausea, vomiting, or any other associated symptoms.

## 2018-06-25 NOTE — Telephone Encounter (Signed)
Mr. Sharman states he has been "very gassy" for the last few days and it is causing SOB. He gets winded very easily when walking his dog or going up any stairs. He is extremely anxious. He requested a same day appointment.  The patient was placed on hold to look for an appointment. When the phone was picked back up, he stated he took a tablet of NTG because he now has active CP. He states after a couple minutes he is starting to feel better, but is going to the ER.  He will go to [facility] for evaluation. He understands Dr. Angelena Form will be notified.

## 2018-06-25 NOTE — Telephone Encounter (Signed)
New Message         Pt c/o Shortness Of Breath: STAT if SOB developed within the last 24 hours or pt is noticeably SOB on the phone  1. Are you currently SOB (can you hear that pt is SOB on the phone)?Yes  2. How long have you been experiencing SOB? Last Thursday  3. Are you SOB when sitting or when up moving around? Only up when moving  4. Are you currently experiencing any other symptoms? No

## 2018-06-26 ENCOUNTER — Other Ambulatory Visit: Payer: Self-pay

## 2018-06-26 DIAGNOSIS — R079 Chest pain, unspecified: Secondary | ICD-10-CM | POA: Diagnosis not present

## 2018-06-26 DIAGNOSIS — R778 Other specified abnormalities of plasma proteins: Secondary | ICD-10-CM | POA: Diagnosis present

## 2018-06-26 DIAGNOSIS — K921 Melena: Secondary | ICD-10-CM | POA: Diagnosis not present

## 2018-06-26 DIAGNOSIS — D509 Iron deficiency anemia, unspecified: Secondary | ICD-10-CM | POA: Diagnosis not present

## 2018-06-26 DIAGNOSIS — K2210 Ulcer of esophagus without bleeding: Secondary | ICD-10-CM | POA: Diagnosis not present

## 2018-06-26 DIAGNOSIS — R7989 Other specified abnormal findings of blood chemistry: Secondary | ICD-10-CM

## 2018-06-26 DIAGNOSIS — I2510 Atherosclerotic heart disease of native coronary artery without angina pectoris: Secondary | ICD-10-CM

## 2018-06-26 DIAGNOSIS — R072 Precordial pain: Secondary | ICD-10-CM | POA: Diagnosis not present

## 2018-06-26 DIAGNOSIS — D649 Anemia, unspecified: Secondary | ICD-10-CM | POA: Diagnosis not present

## 2018-06-26 DIAGNOSIS — I10 Essential (primary) hypertension: Secondary | ICD-10-CM

## 2018-06-26 LAB — CBC
HCT: 25.9 % — ABNORMAL LOW (ref 39.0–52.0)
HCT: 27.3 % — ABNORMAL LOW (ref 39.0–52.0)
Hemoglobin: 8.8 g/dL — ABNORMAL LOW (ref 13.0–17.0)
Hemoglobin: 9.1 g/dL — ABNORMAL LOW (ref 13.0–17.0)
MCH: 30.6 pg (ref 26.0–34.0)
MCH: 30.3 pg (ref 26.0–34.0)
MCHC: 34 g/dL (ref 30.0–36.0)
MCHC: 33.3 g/dL (ref 30.0–36.0)
MCV: 89.9 fL (ref 80.0–100.0)
MCV: 91 fL (ref 80.0–100.0)
Platelets: 253 10*3/uL (ref 150–400)
Platelets: 247 10*3/uL (ref 150–400)
RBC: 2.88 MIL/uL — ABNORMAL LOW (ref 4.22–5.81)
RBC: 3 MIL/uL — ABNORMAL LOW (ref 4.22–5.81)
RDW: 14.6 % (ref 11.5–15.5)
RDW: 15 % (ref 11.5–15.5)
WBC: 6.7 10*3/uL (ref 4.0–10.5)
WBC: 6.5 10*3/uL (ref 4.0–10.5)
nRBC: 0.4 % — ABNORMAL HIGH (ref 0.0–0.2)
nRBC: 0.3 % — ABNORMAL HIGH (ref 0.0–0.2)

## 2018-06-26 LAB — IRON AND TIBC
Iron: 51 ug/dL (ref 45–182)
Saturation Ratios: 13 % — ABNORMAL LOW (ref 17.9–39.5)
TIBC: 381 ug/dL (ref 250–450)
UIBC: 330 ug/dL

## 2018-06-26 LAB — LIPID PANEL
Cholesterol: 98 mg/dL (ref 0–200)
HDL: 27 mg/dL — ABNORMAL LOW (ref >40)
LDL Cholesterol: 46 mg/dL (ref 0–99)
Total CHOL/HDL Ratio: 3.6 RATIO
Triglycerides: 123 mg/dL (ref <150)
VLDL: 25 mg/dL (ref 0–40)

## 2018-06-26 LAB — FOLATE: Folate: 11.7 ng/mL (ref >5.9)

## 2018-06-26 LAB — TROPONIN I
Troponin I: 0.04 ng/mL (ref <0.03)
Troponin I: 0.05 ng/mL (ref <0.03)
Troponin I: 0.04 ng/mL (ref <0.03)

## 2018-06-26 LAB — GLUCOSE, CAPILLARY
Glucose-Capillary: 75 mg/dL (ref 70–99)
Glucose-Capillary: 76 mg/dL (ref 70–99)
Glucose-Capillary: 108 mg/dL — ABNORMAL HIGH (ref 70–99)
Glucose-Capillary: 134 mg/dL — ABNORMAL HIGH (ref 70–99)
Glucose-Capillary: 91 mg/dL (ref 70–99)
Glucose-Capillary: 121 mg/dL — ABNORMAL HIGH (ref 70–99)

## 2018-06-26 LAB — APTT: aPTT: 30 seconds (ref 24–36)

## 2018-06-26 LAB — PREPARE RBC (CROSSMATCH)

## 2018-06-26 LAB — ABO/RH: ABO/RH(D): O POS

## 2018-06-26 LAB — FERRITIN: Ferritin: 29 ng/mL (ref 24–336)

## 2018-06-26 LAB — VITAMIN B12: Vitamin B-12: 425 pg/mL (ref 180–914)

## 2018-06-26 MED ORDER — SODIUM CHLORIDE 0.9% IV SOLUTION: Freq: Once | INTRAVENOUS | Status: DC

## 2018-06-26 MED ORDER — SODIUM CHLORIDE 0.9 % IV SOLN
INTRAVENOUS | Status: DC | PRN
  Administered 2018-06-26: 500 mL via INTRAVENOUS
  Administered 2018-06-27: 15:00:00 via INTRAVENOUS

## 2018-06-26 MED ORDER — HYDROCHLOROTHIAZIDE 25 MG PO TABS
25.0000 mg | ORAL_TABLET | Freq: Every day | ORAL | Status: DC
  Administered 2018-06-26 – 2018-06-27 (×2): 25 mg via ORAL
  Filled 2018-06-26 (×2): qty 1

## 2018-06-26 MED ORDER — GLUCOSE 40 % PO GEL: 1.0000 | Freq: Once | ORAL | Status: DC

## 2018-06-26 MED ORDER — GLUCOSE 40 % PO GEL
ORAL | Status: AC
  Administered 2018-06-26: 37.5 g
  Filled 2018-06-26: qty 1

## 2018-06-26 MED ORDER — LOSARTAN POTASSIUM 50 MG PO TABS
100.0000 mg | ORAL_TABLET | Freq: Every day | ORAL | Status: DC
  Administered 2018-06-26 – 2018-06-27 (×2): 100 mg via ORAL
  Filled 2018-06-26 (×3): qty 2

## 2018-06-26 NOTE — Progress Notes (Addendum)
Progress Note    Hayden Williams  PJA:[id number] DOB: July 28, 1955  DOA: 06/25/2018 PCP: Velna Hatchet, MD    Brief Narrative:   Chief complaint: Dark stools  Medical records reviewed and are as summarized below:  Hayden Williams is an 63 y.o. male with pmh of HTN, HLD, DM type II, CAD s/p stent placement, gout, CKD stage III followed by nephrology; who presented with complaints of shortness of breath, chest pain, and dark stools.  Assessment/Plan:   Principal Problem:   Symptomatic anemia Active Problems:   Type II diabetes mellitus with renal manifestations (HCC)   CKD (chronic kidney disease) stage 3, GFR 30-59 ml/min (HCC)   CAD (coronary artery disease)   Essential hypertension   GIB (gastrointestinal bleeding)   Chest pain   Elevated troponin 1.  Suspected upper gastrointestinal bleeding with acute blood loss anemia: Symptoms had been present for approximately 1 week and he reported having 1-2 dark liquid bowel movements per day.  Denied any NSAID use due to kidney function, but was on Plavix and aspirin for history of coronary artery disease. Patient previous baseline hemoglobin was noted to be around 13.6, but dropped as low as 8.2.  Stool guaiacs are positive and labs revealed a significantly elevated BUN given concern for upper GI bleed.  He was started on IV fluids, Protonix drip, and transfused 1 unit of packed red blood cells.  Subsequent repeat hemoglobin 9.1 on 2/18.   -Clear liquid diet -Continue IV fluids, but decreased rate to 100 mL/h -Continue Protonix drip -Dr. Lavina Hamman gastroenterology consulted(plan for EGD in a.m.) -Recheck blood counts in a.m., and transfuse PRBCs as necessary  2.  Acute kidney injury superimposed on chronic kidney disease stage III: Patient is followed by nephrology in the outpatient setting and notes that is creatinine had previously been around 2.1, but was elevated up to 2.54 when checked last week when symptoms first started.  Today  creatinine 2.64 with BUN elevated at 41.  Suspect this is related to acute blood loss. -Continue IV fluids -Avoid nephrotoxic agent -Recheck BMP in a.m.  3.  chest pain and elevated troponin, history of coronary artery disease, : Acute.  Prior to arrival patient complaining of chest discomfort.  Previous history of stent placement in 2016 on Plavix and aspirin.  Troponins mildly elevated at 0.04.  Suspect symptoms likely related to demand ischemia with blood loss.  Cardiology had been consulted. -Hold Plavix and aspirin -Continue atorvastatin  4.  Diabetes mellitus type 2: Patient on Victoza and glargine insulin 44 units subcutaneous daily at home.  Blood sugars elevated until 190s. -Hypoglycemic protocols -Continue glargine at reduced dose 30 units subcutaneous -Continue sensitive SSI with meals  5.  Essential hypertension: Blood pressures appear stable. -Continue metoprolol and amlodipine as tolerated   Body mass index is 30.67 kg/m.   Family Communication/Anticipated D/C date and plan/Code Status   DVT prophylaxis: SCDs Code Status: Full Code.  Family Communication: No family present at bedside Disposition Plan: Possible discharge home in 1 to 2 days   Medical Consultants:    Dr. Fuller Plan gastroenterology   Anti-Infectives:    None  Subjective:   He notes the symptoms started approximately 1 week ago with liquid dark stools. Denies any significant abdominal pain, history of hemorrhoids, and had never had symptoms like this before.  He has not had a bowel movement today yet.  Objective:    Vitals:   06/26/18 0306 06/26/18 0448 06/26/18 0800 06/26/18 1200  BP:  124/85 126/79 (!) 135/91 114/72  Pulse: 78 83 79 84  Resp: 15 18 16 16   Temp: (!) 97.5 F (36.4 C) 98.1 F (36.7 C) 97.9 F (36.6 C) 98 F (36.7 C)  TempSrc: Oral Oral Oral Oral  SpO2: 100% 96% 96% 100%  Weight:      Height:        Intake/Output Summary (Last 24 hours) at 06/26/2018 1609 Last data  filed at 06/26/2018 1019 Gross per 24 hour  Intake 1501.75 ml  Output 600 ml  Net 901.75 ml   Filed Weights   06/26/18 0201  Weight: 99.7 kg    Exam: Constitutional: NAD, calm, comfortable Eyes: Disconjugate gaze,lids and conjunctivae normal ENMT: Mucous membranes are moist. Posterior pharynx clear of any exudate or lesions.   Neck: normal, supple, no masses, no thyromegaly Respiratory: clear to auscultation bilaterally, no wheezing, no crackles. Normal respiratory effort. No accessory muscle use.  Cardiovascular: Regular rate and rhythm, no murmurs / rubs / gallops. No extremity edema. 2+ pedal pulses. No carotid bruits.  Abdomen: no tenderness, no masses palpated. No hepatosplenomegaly. Bowel sounds positive.  Musculoskeletal: no clubbing / cyanosis. No joint deformity upper and lower extremities. Good ROM, no contractures. Normal muscle tone.  Skin: no rashes, lesions, ulcers. No induration Neurologic: CN 2-12 grossly intact. Sensation intact, DTR normal. Strength 5/5 in all 4.  Psychiatric: Normal judgment and insight. Alert and oriented x 3. Normal mood.    Data Reviewed:   I have personally reviewed following labs and imaging studies:  Labs: Labs show the following:   Basic Metabolic Panel: Recent Labs  Lab 06/25/18 1557  NA 139  K 3.5  CL 105  CO2 22  GLUCOSE 198*  BUN 41*  CREATININE 2.64*  CALCIUM 8.6*   GFR Estimated Creatinine Clearance: 34.9 mL/min (A) (by C-G formula based on SCr of 2.64 mg/dL (H)). Liver Function Tests: No results for input(s): AST, ALT, ALKPHOS, BILITOT, PROT, ALBUMIN in the last 168 hours. No results for input(s): LIPASE, AMYLASE in the last 168 hours. No results for input(s): AMMONIA in the last 168 hours. Coagulation profile Recent Labs  Lab 06/25/18 1557  INR 0.98    CBC: Recent Labs  Lab 06/25/18 1557 06/25/18 2327 06/26/18 0524 06/26/18 1035  WBC 8.9 7.7 6.7 6.5  HGB 8.3* 8.2* 8.8* 9.1*  HCT 24.3* 24.8* 25.9*  27.3*  MCV 93.5 93.2 89.9 91.0  PLT 293 276 253 247   Cardiac Enzymes: Recent Labs  Lab 06/25/18 2327 06/26/18 0524 06/26/18 1035  TROPONINI 0.04* 0.05* 0.04*   BNP (last 3 results) No results for input(s): PROBNP in the last 8760 hours. CBG: Recent Labs  Lab 06/26/18 0256 06/26/18 0329 06/26/18 0755 06/26/18 1156 06/26/18 1606  GLUCAP 75 76 108* 134* 91   D-Dimer: No results for input(s): DDIMER in the last 72 hours. Hgb A1c: No results for input(s): HGBA1C in the last 72 hours. Lipid Profile: Recent Labs    06/26/18 0524  CHOL 98  HDL 27*  LDLCALC 46  TRIG 123  CHOLHDL 3.6   Thyroid function studies: No results for input(s): TSH, T4TOTAL, T3FREE, THYROIDAB in the last 72 hours.  Invalid input(s): FREET3 Anemia work up: Recent Labs    06/25/18 2327  VITAMINB12 425  FOLATE 11.7  FERRITIN 29  TIBC 381  IRON 51  RETICCTPCT 6.5*   Sepsis Labs: Recent Labs  Lab 06/25/18 1557 06/25/18 2327 06/26/18 0524 06/26/18 1035  WBC 8.9 7.7 6.7 6.5  Microbiology No results found for this or any previous visit (from the past 240 hour(s)).  Procedures and diagnostic studies:  Dg Chest 2 View  Result Date: 06/25/2018 CLINICAL DATA:  Chest pain EXAM: CHEST - 2 VIEW COMPARISON:  November 22, 2014 FINDINGS: No edema or consolidation. Heart size and pulmonary vascularity are normal. No adenopathy. No pneumothorax. No bone lesions. IMPRESSION: No edema or consolidation. Electronically Signed   By: Lowella Grip III M.D.   On: 06/25/2018 17:18    Medications:   . sodium chloride   Intravenous Once  . amLODipine  10 mg Oral QHS  . atorvastatin  80 mg Oral Daily  . dextrose  1 Tube Oral Once  . losartan  100 mg Oral Daily   And  . hydrochlorothiazide  25 mg Oral Daily  . insulin aspart  0-9 Units Subcutaneous TID WC  . insulin glargine  30 Units Subcutaneous QPC supper  . metoprolol tartrate  100 mg Oral BID  . [START ON 06/29/2018] pantoprazole  40 mg  Intravenous Q12H   Continuous Infusions: . sodium chloride 125 mL/hr at 06/26/18 1152  . sodium chloride Stopped (06/26/18 0158)  . pantoprozole (PROTONIX) infusion 8 mg/hr (06/26/18 0324)     LOS: 0 days   Hayden Williams  [facility]   *Please refer to [email or web address], password [email or web address] to get updated schedule on who will round on this patient, as hospitalists switch teams weekly. If 7PM-7AM, please contact night-coverage at [email or web address], password [email or web address] for any overnight needs.

## 2018-06-26 NOTE — H&P (View-Only) (Signed)
[facility] Consult: 9:12 AM 06/26/2018  LOS: 0 days    Referring Provider: Dr Tamala Julian  Primary Care Physician:  Velna Hatchet, MD Primary Gastroenterologist:  Robb Matar, MD [facility])    Reason for Consultation:  Anemia, dark stools.     HPI: Hayden Williams is a 63 y.o. male.  On Plavix, 81ASA for CAD, DES in 11/2014.  Type 2, IDDM.  CKD 3. Routine, average risk screening colonoscopy 03/2017 by Dr. Reather Laurence at [facility] in [place].  We do not have access to those records.  Early to middle of last week he started to feel unwell.  Then he developed loose, black stools along with dizzy nests, weakness, cold sweats.  This was while he was traveling for his work to [employer] and then onto [place].  He started to feel dyspnea on exertion.  He ended up driving back at the end of the week to home in [place].  He had black but formed stools over the weekend.  No nausea or vomiting.  Still having persistent DOE which reminded him of when he had heart problems.  He had 30 minutes of substernal chest burning feeling like heartburn that resolved after nitroglycerin.  He also had abdominal bloating  He ended up admitted to the hospital yesterday. Stool FOBT positive Hgb 8.2.  MCV normal.  In 11/2014 his Hgb was 13.8. No evidence for iron deficiency, folate or B12 deficiency BUN/creatinine 41/2.6.  They were 43 and 2.2 in 09/2015. Troponin I 0.04, 0.05.  Patient takes no NSAIDs other than the 81 mg aspirin.  Last dose of aspirin and Plavix were in the morning of 06/25/2018.  No alcohol.     Past Medical History:  Diagnosis Date  . CAD (coronary artery disease)    a. 11/2014 Cath/PCI: LM nl, LAD 50p, 60d, RI 40, LCX small, OM1 40, OM2 40, RCA 95d  (2.25x20 Promus Premier DES), EF nl.  . CKD (chronic kidney disease), stage III ([place])   . Diabetes mellitus without complication ([place])   . Essential hypertension   . Hyperlipidemia     Past Surgical History:  Procedure Laterality Date  . ANKLE SURGERY Right   . CARDIAC CATHETERIZATION N/A 12/02/2014   Procedure: Left Heart Cath and Coronary Angiography;  Surgeon: Burnell Blanks, MD;  Location: [facility];  Service: Cardiovascular;  Laterality: N/A;  . CARDIAC CATHETERIZATION N/A 12/02/2014   Procedure: Coronary Stent Intervention;  Surgeon: Burnell Blanks, MD;  Location: [facility];  Service: Cardiovascular;  Laterality: N/A;  . CATARACT EXTRACTION    . CORONARY ANGIOPLASTY WITH STENT PLACEMENT    . EYE SURGERY    . INTRAOCULAR PROSTHESES INSERTION      Prior to Admission medications   Medication Sig Start Date End Date Taking? Authorizing Provider  amLODipine (NORVASC) 10 MG tablet Take 10 mg by mouth at bedtime.  02/23/15  Yes [provider]  aspirin 81 MG chewable tablet Chew 1 tablet (81 mg total) by mouth daily. 12/03/14  Yes  Theora Gianotti, NP  atorvastatin (LIPITOR) 80 MG tablet TAKE 1 TABLET BY MOUTH EVERY DAY Patient taking differently: Take 80 mg by mouth daily.  09/11/17  Yes Burnell Blanks, MD  clopidogrel (PLAVIX) 75 MG tablet Take 1 tablet (75 mg total) by mouth daily. Please keep upcoming appt in April with Dr. Angelena Form for future refills. Thank you Patient taking differently: Take 75 mg by mouth daily.  06/19/18  Yes Burnell Blanks, MD  Continuous Blood Gluc Sensor (DEXCOM G6 SENSOR) MISC 1 patch See admin instructions. Place 1 new sensor every 10 days to obtain continuous glucose readings 02/01/18  Yes [provider]  Insulin Glargine (BASAGLAR KWIKPEN) 100 UNIT/ML SOPN Inject 44 Units into the skin daily after supper.  03/22/17  Yes [provider]  ketoconazole (NIZORAL) 2 % cream Apply  1 application topically as needed (to right foot several times a week for irritation).    Yes [provider]  losartan-hydrochlorothiazide (HYZAAR) 100-25 MG tablet Take 1 tablet by mouth daily. 11/27/15  Yes [provider]  metoprolol tartrate (LOPRESSOR) 100 MG tablet Take 1 tablet (100 mg total) by mouth 2 (two) times daily. Please schedule overdue appt for future refills. 2nd attempt. Patient taking differently: Take 100 mg by mouth 2 (two) times daily.  05/30/18  Yes Burnell Blanks, MD  MITIGARE 0.6 MG CAPS Take 1 capsule by mouth daily as needed (for gout flares).  12/09/14  Yes [provider]  neomycin-bacitracin-polymyxin (NEOSPORIN) ointment Apply 1 application topically as needed for wound care (of right foot). apply to eye    Yes [provider]  nitroGLYCERIN (NITROSTAT) 0.4 MG SL tablet PLACE 1 TABLET UNDER THE TONGUE EVERY 5 MINUTES AS NEEDED FOR CHEST PAIN Patient taking differently: Place 0.4 mg under the tongue every 5 (five) minutes as needed for chest pain.  04/11/16  Yes Theora Gianotti, NP  NOVOLOG FLEXPEN 100 UNIT/ML FlexPen Inject 2-11 Units into the skin See admin instructions. Inject 2-11 units into the skin two times a day with meals, per sliding scale 06/26/16  Yes [provider]  pioglitazone (ACTOS) 15 MG tablet Take 15 mg by mouth daily. 03/26/18  Yes [provider]  Podiatric Products (GOLD BOND FOOT) CREA Apply 1 application topically daily as needed (cracked feet).    Yes [provider]  VICTOZA 18 MG/3ML SOPN Inject 1.8 mg into the skin daily before lunch.  12/18/15  Yes [provider]  Cholecalciferol (VITAMIN D) 2000 UNITS CAPS Take 2,000 Units by mouth daily.    [provider]  VITAMIN A PO Take 10,000 Units by mouth daily.     [provider]    Scheduled Meds: . sodium chloride   Intravenous Once  . amLODipine  10 mg Oral QHS  . atorvastatin  80 mg  Oral Daily  . dextrose  1 Tube Oral Once  . losartan  100 mg Oral Daily   And  . hydrochlorothiazide  25 mg Oral Daily  . insulin aspart  0-9 Units Subcutaneous TID WC  . insulin glargine  30 Units Subcutaneous QPC supper  . metoprolol tartrate  100 mg Oral BID  . [START ON 06/29/2018] pantoprazole  40 mg Intravenous Q12H   Infusions: . sodium chloride 125 mL/hr at 06/26/18 0159  . sodium chloride Stopped (06/26/18 0158)  . pantoprozole (PROTONIX) infusion 8 mg/hr (06/26/18 0324)   PRN Meds: sodium chloride, acetaminophen, hydrALAZINE, morphine injection, nitroGLYCERIN, ondansetron (ZOFRAN) IV  Allergies as of 06/25/2018 - Review Complete 06/25/2018  Allergen Reaction Noted  . Peanut oil Anaphylaxis and Swelling 12/15/2017  . Peanut-containing drug products Anaphylaxis and Swelling 01/01/2015  . Lisinopril Other (See Comments) and Cough 04/11/2012  . Niacin Other (See Comments) 04/11/2012    Family History  Problem Relation Age of Onset  . Diabetes Mother   . Heart disease Mother   . Sleep apnea Mother   . Cancer Father   . Diabetes Father   . Diabetes Sister   . Cancer Brother   . Diabetes Brother     Social History   Socioeconomic History  . Marital status: Married    Spouse name: Hayden Williams  . Number of children: 3  . Years of education: MBA  . Highest education level: Not on file  Occupational History  . Not on file  Social Needs  . Financial resource strain: Not on file  . Food insecurity:    Worry: Not on file    Inability: Not on file  . Transportation needs:    Medical: Not on file    Non-medical: Not on file  Tobacco Use  . Smoking status: Never Smoker  . Smokeless tobacco: Never Used  Substance and Sexual Activity  . Alcohol use: No    Alcohol/week: 0.0 standard drinks  . Drug use: No  . Sexual activity: Not on file  Lifestyle  . Physical activity:    Days per week: Not on file    Minutes per session: Not on file  . Stress: Not on file    Relationships  . Social connections:    Talks on phone: Not on file    Gets together: Not on file    Attends religious service: Not on file    Active member of club or organization: Not on file    Attends meetings of clubs or organizations: Not on file    Relationship status: Not on file  . Intimate partner violence:    Fear of current or ex partner: Not on file    Emotionally abused: Not on file    Physically abused: Not on file    Forced sexual activity: Not on file  Other Topics Concern  . Not on file  Social History Narrative   Drinks caffeien 4-5 times a month     REVIEW OF SYSTEMS: Constitutional:  Per HPI ENT:  No nose bleeds Pulm:  Per HPI.  Cough. CV:  No palpitations, no LE edema.  Chest pain GU:  No hematuria, no frequency GI: Enteral he does not suffer from much trouble with heartburn or indigestion.  No dysphagia.  No prior episodes of rectal bleeding or black stools. Heme: Other than the dark stools, does not have issues with excessive bleeding or bruising. Transfusions: None Neuro:  No headaches, no peripheral tingling or numbness.  No seizure, no syncope. Derm:  No itching, no rash or sores.  Endocrine:  No sweats or chills.  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  PHYSICAL EXAM: Vital signs in last 24 hours: Vitals:   06/26/18 0448 06/26/18 0800  BP: 126/79 (!) 135/91  Pulse: 83 79  Resp: 18 16  Temp: 98.1 F (36.7 C) 97.9 F (36.6 C)  SpO2: 96% 96%   Wt Readings from Last 3 Encounters:  06/26/18 99.7 kg  06/23/17 97.5 kg  04/26/17 97.5 kg    General: Very pleasant, well-appearing man.  Does not look or act ill  Head: No facial asymmetry or swelling.  No signs of head trauma. Eyes: No scleral icterus, no conjunctival pallor.  EOMI. Ears: Not hard of hearing Nose: No congestion or discharge Mouth: Oropharynx moist, pink, clear. Neck: No JVD, no masses, no thyromegaly. Lungs: Clear bilaterally.   No labored breathing, no cough. Heart: RRR.  No MRG.  S1, S2 present. Abdomen: Soft.  Not tender or distended.  No HSM, masses, bruits, hernias.  Bowel sounds active.  External glucose monitoring device in position in the lower abdomen Rectal: Deferred rectal exam.  This was noted by the ED physician yesterday.  No masses and stool was guaiac positive. Musc/Skeltl: No joint swelling, redness or deformity. Extremities: No CCE. Neurologic: Very detailed, excellent historian.  Moves all 4 limbs.  No tremor, no weakness, no gross deficits. Skin: No rashes, no sores, no purpura or bruising. Tattoos: None observed. Nodes: No cervical adenopathy Psych: Calm, pleasant.  Intake/Output from previous day: 02/17 0701 - 02/18 0700 In: 1261.8 [I.V.:344.4; Blood:315; IV Piggyback:602.3] Out: 200 [Urine:200] Intake/Output this shift: Total I/O In: 240 [P.O.:240] Out: 400 [Urine:400]  LAB RESULTS: Recent Labs    06/25/18 1557 06/25/18 2327 06/26/18 0524  WBC 8.9 7.7 6.7  HGB 8.3* 8.2* 8.8*  HCT 24.3* 24.8* 25.9*  PLT 293 276 253   BMET Lab Results  Component Value Date   NA 139 06/25/2018   NA 142 09/11/2015   NA 142 12/12/2014   K 3.5 06/25/2018   K 3.9 09/11/2015   K 3.4 (L) 12/12/2014   CL 105 06/25/2018   CL 106 09/11/2015   CL 108 12/12/2014   CO2 22 06/25/2018   CO2 27 09/11/2015   CO2 27 12/12/2014   GLUCOSE 198 (H) 06/25/2018   GLUCOSE 160 (H) 09/11/2015   GLUCOSE 124 (H) 12/12/2014   BUN 41 (H) 06/25/2018   BUN 43 (H) 09/11/2015   BUN 20 12/12/2014   CREATININE 2.64 (H) 06/25/2018   CREATININE 2.28 (H) 09/11/2015   CREATININE 1.73 (H) 12/12/2014   CALCIUM 8.6 (L) 06/25/2018   CALCIUM 9.2 09/11/2015   CALCIUM 8.3 (L) 12/12/2014   LFT No results for input(s): PROT, ALBUMIN, AST, ALT, ALKPHOS, BILITOT, BILIDIR, IBILI in the last 72 hours. PT/INR Lab Results  Component Value Date   INR 0.98 06/25/2018     Ref. Range 06/25/2018 23:27  Iron Latest Ref Range: 45 -  182 ug/dL 51  UIBC Latest Units: ug/dL 330  TIBC Latest Ref Range: 250 - 450 ug/dL 381  Saturation Ratios Latest Ref Range: 17.9 - 39.5 % 13 (L)  Ferritin Latest Ref Range: 24 - 336 ng/mL 29  Folate Latest Ref Range: >5.9 ng/mL 11.7  Vitamin B12 Latest Ref Range: 180 - 914 pg/mL 425    Hepatitis Panel No results for input(s): HEPBSAG, HCVAB, HEPAIGM, HEPBIGM in the last 72 hours. C-Diff No components found for: CDIFF Lipase  No results found for: LIPASE  Drugs of Abuse  No results found for: LABOPIA, COCAINSCRNUR, LABBENZ, AMPHETMU, THCU, LABBARB   RADIOLOGY STUDIES: Dg Chest 2 View  Result Date: 06/25/2018 CLINICAL DATA:  Chest pain EXAM: CHEST - 2 VIEW COMPARISON:  November 22, 2014 FINDINGS: No edema or consolidation. Heart size and pulmonary vascularity are normal. No adenopathy. No pneumothorax. No bone lesions. IMPRESSION: No edema or consolidation. Electronically Signed   By: Lowella Grip III M.D.   On: 06/25/2018 17:18    IMPRESSION:   *   GI bleed with dark stools.  *  Chronic Plavix for history CAD and previous cardiac stent.  *    Normocytic anemia.  No evidence of iron deficiency, B12 or folate deficiency on labs.  *    Mild elevation in troponin I.    *    AKI, ?  Baseline level.  He sees nephrologist in [place] every 3 to 6 months.   PLAN:     *   I put orders in for upper endoscopy tomorrow and discussed with pt.  Final decision as to proceeding with an EGD will be made by Dr. Fuller Plan.  Patient can continue on clear liquids and PPI drip.   Keep holding the Plavix and ASA.   Pt concerned about cost of care as his insurance is not great, so anything we can do to keep costs low and limit days inpt will be helpful.     Azucena Freed  06/26/2018, 9:12 AM Phone [phone]     Attending Physician Note   I have taken a history, examined the patient and reviewed the chart. I agree with the Advanced Practitioner's note, impression and  recommendations.  GI bleed with melena and anemia. R/O ulcer, AVM, etc. PPI infusion and EGD tomorrow. The risks (including bleeding, perforation, infection, missed lesions, medication reactions and possible hospitalization or surgery if complications occur), benefits, and alternatives to endoscopy with possible biopsy and possible dilation were discussed with the patient and they consent to proceed.   Cardiac stent on Plavix and ASA. Hold both for now.   Lucio Edward, MD FACG [phone]

## 2018-06-26 NOTE — Telephone Encounter (Signed)
Pt admitted to IM for w/u anemia.

## 2018-06-26 NOTE — Progress Notes (Signed)
Per MD- hold Vitamin A-  Increase risk for bleeding.

## 2018-06-26 NOTE — Progress Notes (Signed)
Patient transferred to [facility] from ED. Patient alert and oriented. Admission assessment complete. Patient updated on plan of care. No other questions at this time. Will continue to monitor patient.

## 2018-06-26 NOTE — Consult Note (Addendum)
[facility] Consult: 9:12 AM 06/26/2018  LOS: 0 days    Referring Provider: Dr Tamala Julian  Primary Care Physician:  Velna Hatchet, MD Primary Gastroenterologist:  Robb Matar, MD  [facility])    Reason for Consultation:  Anemia, dark stools.     HPI: Hayden Williams is a 63 y.o. male.  On Plavix, 81ASA for CAD, DES in 11/2014.  Type 2, IDDM.  CKD 3. Routine, average risk screening colonoscopy 03/2017 by Dr. Reather Laurence at [facility] in [place].  We do not have access to those records.  Early to middle of last week he started to feel unwell.  Then he developed loose, black stools along with dizzy nests, weakness, cold sweats.  This was while he was traveling for his work to [employer] and then onto [place].  He started to feel dyspnea on exertion.  He ended up driving back at the end of the week to home in [place].  He had black but formed stools over the weekend.  No nausea or vomiting.  Still having persistent DOE which reminded him of when he had heart problems.  He had 30 minutes of substernal chest burning feeling like heartburn that resolved after nitroglycerin.  He also had abdominal bloating  He ended up admitted to the hospital yesterday. Stool FOBT positive Hgb 8.2.  MCV normal.  In 11/2014 his Hgb was 13.8. No evidence for iron deficiency, folate or B12 deficiency BUN/creatinine 41/2.6.  They were 43 and 2.2 in 09/2015. Troponin I 0.04, 0.05.  Patient takes no NSAIDs other than the 81 mg aspirin.  Last dose of aspirin and Plavix were in the morning of 06/25/2018.  No alcohol.     Past Medical History:  Diagnosis Date  . CAD (coronary artery disease)    a. 11/2014 Cath/PCI: LM nl, LAD 50p, 60d, RI 40, LCX small, OM1 40, OM2 40, RCA 95d  (2.25x20 Promus Premier DES), EF nl.  . CKD (chronic kidney disease), stage III ([place])   . Diabetes mellitus without complication ([place])   . Essential hypertension   . Hyperlipidemia     Past Surgical History:  Procedure Laterality Date  . ANKLE SURGERY Right   . CARDIAC CATHETERIZATION N/A 12/02/2014   Procedure: Left Heart Cath and Coronary Angiography;  Surgeon: Burnell Blanks, MD;  Location: [facility];  Service: Cardiovascular;  Laterality: N/A;  . CARDIAC CATHETERIZATION N/A 12/02/2014   Procedure: Coronary Stent Intervention;  Surgeon: Burnell Blanks, MD;  Location: [facility];  Service: Cardiovascular;  Laterality: N/A;  . CATARACT EXTRACTION    . CORONARY ANGIOPLASTY WITH STENT PLACEMENT    . EYE SURGERY    . INTRAOCULAR PROSTHESES INSERTION      Prior to Admission medications   Medication Sig Start Date End Date Taking? Authorizing Provider  amLODipine (NORVASC) 10 MG tablet Take 10 mg by mouth at bedtime.  02/23/15  Yes [provider]  aspirin 81 MG chewable tablet Chew 1 tablet (81 mg total) by mouth daily. 12/03/14  Yes  Theora Gianotti, NP  atorvastatin (LIPITOR) 80 MG tablet TAKE 1 TABLET BY MOUTH EVERY DAY Patient taking differently: Take 80 mg by mouth daily.  09/11/17  Yes Burnell Blanks, MD  clopidogrel (PLAVIX) 75 MG tablet Take 1 tablet (75 mg total) by mouth daily. Please keep upcoming appt in April with Dr. Angelena Form for future refills. Thank you Patient taking differently: Take 75 mg by mouth daily.  06/19/18  Yes Burnell Blanks, MD  Continuous Blood Gluc Sensor (DEXCOM G6 SENSOR) MISC 1 patch See admin instructions. Place 1 new sensor every 10 days to obtain continuous glucose readings 02/01/18  Yes [provider]  Insulin Glargine (BASAGLAR KWIKPEN) 100 UNIT/ML SOPN Inject 44 Units into the skin daily after supper.  03/22/17  Yes [provider]  ketoconazole (NIZORAL) 2 % cream Apply  1 application topically as needed (to right foot several times a week for irritation).    Yes [provider]  losartan-hydrochlorothiazide (HYZAAR) 100-25 MG tablet Take 1 tablet by mouth daily. 11/27/15  Yes [provider]  metoprolol tartrate (LOPRESSOR) 100 MG tablet Take 1 tablet (100 mg total) by mouth 2 (two) times daily. Please schedule overdue appt for future refills. 2nd attempt. Patient taking differently: Take 100 mg by mouth 2 (two) times daily.  05/30/18  Yes Burnell Blanks, MD  MITIGARE 0.6 MG CAPS Take 1 capsule by mouth daily as needed (for gout flares).  12/09/14  Yes [provider]  neomycin-bacitracin-polymyxin (NEOSPORIN) ointment Apply 1 application topically as needed for wound care (of right foot). apply to eye    Yes [provider]  nitroGLYCERIN (NITROSTAT) 0.4 MG SL tablet PLACE 1 TABLET UNDER THE TONGUE EVERY 5 MINUTES AS NEEDED FOR CHEST PAIN Patient taking differently: Place 0.4 mg under the tongue every 5 (five) minutes as needed for chest pain.  04/11/16  Yes Theora Gianotti, NP  NOVOLOG FLEXPEN 100 UNIT/ML FlexPen Inject 2-11 Units into the skin See admin instructions. Inject 2-11 units into the skin two times a day with meals, per sliding scale 06/26/16  Yes [provider]  pioglitazone (ACTOS) 15 MG tablet Take 15 mg by mouth daily. 03/26/18  Yes [provider]  Podiatric Products (GOLD BOND FOOT) CREA Apply 1 application topically daily as needed (cracked feet).    Yes [provider]  VICTOZA 18 MG/3ML SOPN Inject 1.8 mg into the skin daily before lunch.  12/18/15  Yes [provider]  Cholecalciferol (VITAMIN D) 2000 UNITS CAPS Take 2,000 Units by mouth daily.    [provider]  VITAMIN A PO Take 10,000 Units by mouth daily.     [provider]    Scheduled Meds: . sodium chloride   Intravenous Once  . amLODipine  10 mg Oral QHS  . atorvastatin  80 mg  Oral Daily  . dextrose  1 Tube Oral Once  . losartan  100 mg Oral Daily   And  . hydrochlorothiazide  25 mg Oral Daily  . insulin aspart  0-9 Units Subcutaneous TID WC  . insulin glargine  30 Units Subcutaneous QPC supper  . metoprolol tartrate  100 mg Oral BID  . [START ON 06/29/2018] pantoprazole  40 mg Intravenous Q12H   Infusions: . sodium chloride 125 mL/hr at 06/26/18 0159  . sodium chloride Stopped (06/26/18 0158)  . pantoprozole (PROTONIX) infusion 8 mg/hr (06/26/18 0324)   PRN Meds: sodium chloride, acetaminophen, hydrALAZINE, morphine injection, nitroGLYCERIN, ondansetron (ZOFRAN) IV  Allergies as of 06/25/2018 - Review Complete 06/25/2018  Allergen Reaction Noted  . Peanut oil Anaphylaxis and Swelling 12/15/2017  . Peanut-containing drug products Anaphylaxis and Swelling 01/01/2015  . Lisinopril Other (See Comments) and Cough 04/11/2012  . Niacin Other (See Comments) 04/11/2012    Family History  Problem Relation Age of Onset  . Diabetes Mother   . Heart disease Mother   . Sleep apnea Mother   . Cancer Father   . Diabetes Father   . Diabetes Sister   . Cancer Brother   . Diabetes Brother     Social History   Socioeconomic History  . Marital status: Married    Spouse name: [place]  . Number of children: 3  . Years of education: MBA  . Highest education level: Not on file  Occupational History  . Not on file  Social Needs  . Financial resource strain: Not on file  . Food insecurity:    Worry: Not on file    Inability: Not on file  . Transportation needs:    Medical: Not on file    Non-medical: Not on file  Tobacco Use  . Smoking status: Never Smoker  . Smokeless tobacco: Never Used  Substance and Sexual Activity  . Alcohol use: No    Alcohol/week: 0.0 standard drinks  . Drug use: No  . Sexual activity: Not on file  Lifestyle  . Physical activity:    Days per week: Not on file    Minutes per session: Not on file  . Stress: Not on file    Relationships  . Social connections:    Talks on phone: Not on file    Gets together: Not on file    Attends religious service: Not on file    Active member of club or organization: Not on file    Attends meetings of clubs or organizations: Not on file    Relationship status: Not on file  . Intimate partner violence:    Fear of current or ex partner: Not on file    Emotionally abused: Not on file    Physically abused: Not on file    Forced sexual activity: Not on file  Other Topics Concern  . Not on file  Social History Narrative   Drinks caffeien 4-5 times a month     REVIEW OF SYSTEMS: Constitutional:  Per HPI ENT:  No nose bleeds Pulm:  Per HPI.  Cough. CV:  No palpitations, no LE edema.  Chest pain GU:  No hematuria, no frequency GI: Enteral he does not suffer from much trouble with heartburn or indigestion.  No dysphagia.  No prior episodes of rectal bleeding or black stools. Heme: Other than the dark stools, does not have issues with excessive bleeding or bruising. Transfusions: None Neuro:  No headaches, no peripheral tingling or numbness.  No seizure, no syncope. Derm:  No itching, no rash or sores.  Endocrine:  No sweats or chills.  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  PHYSICAL EXAM: Vital signs in last 24 hours: Vitals:   06/26/18 0448 06/26/18 0800  BP: 126/79 (!) 135/91  Pulse: 83 79  Resp: 18 16  Temp: 98.1 F (36.7 C) 97.9 F (36.6 C)  SpO2: 96% 96%   Wt Readings from Last 3 Encounters:  06/26/18 99.7 kg  06/23/17 97.5 kg  04/26/17 97.5 kg    General: Very pleasant, well-appearing man.  Does not look or act ill  Head: No facial asymmetry or swelling.  No signs of head trauma. Eyes: No scleral icterus, no conjunctival pallor.  EOMI. Ears: Not hard of hearing Nose: No congestion or discharge Mouth: Oropharynx moist, pink, clear. Neck: No JVD, no masses, no thyromegaly. Lungs: Clear bilaterally.   No labored breathing, no cough. Heart: RRR.  No MRG.  S1, S2 present. Abdomen: Soft.  Not tender or distended.  No HSM, masses, bruits, hernias.  Bowel sounds active.  External glucose monitoring device in position in the lower abdomen Rectal: Deferred rectal exam.  This was noted by the ED physician yesterday.  No masses and stool was guaiac positive. Musc/Skeltl: No joint swelling, redness or deformity. Extremities: No CCE. Neurologic: Very detailed, excellent historian.  Moves all 4 limbs.  No tremor, no weakness, no gross deficits. Skin: No rashes, no sores, no purpura or bruising. Tattoos: None observed. Nodes: No cervical adenopathy Psych: Calm, pleasant.  Intake/Output from previous day: 02/17 0701 - 02/18 0700 In: 1261.8 [I.V.:344.4; Blood:315; IV Piggyback:602.3] Out: 200 [Urine:200] Intake/Output this shift: Total I/O In: 240 [P.O.:240] Out: 400 [Urine:400]  LAB RESULTS: Recent Labs    06/25/18 1557 06/25/18 2327 06/26/18 0524  WBC 8.9 7.7 6.7  HGB 8.3* 8.2* 8.8*  HCT 24.3* 24.8* 25.9*  PLT 293 276 253   BMET Lab Results  Component Value Date   NA 139 06/25/2018   NA 142 09/11/2015   NA 142 12/12/2014   K 3.5 06/25/2018   K 3.9 09/11/2015   K 3.4 (L) 12/12/2014   CL 105 06/25/2018   CL 106 09/11/2015   CL 108 12/12/2014   CO2 22 06/25/2018   CO2 27 09/11/2015   CO2 27 12/12/2014   GLUCOSE 198 (H) 06/25/2018   GLUCOSE 160 (H) 09/11/2015   GLUCOSE 124 (H) 12/12/2014   BUN 41 (H) 06/25/2018   BUN 43 (H) 09/11/2015   BUN 20 12/12/2014   CREATININE 2.64 (H) 06/25/2018   CREATININE 2.28 (H) 09/11/2015   CREATININE 1.73 (H) 12/12/2014   CALCIUM 8.6 (L) 06/25/2018   CALCIUM 9.2 09/11/2015   CALCIUM 8.3 (L) 12/12/2014   LFT No results for input(s): PROT, ALBUMIN, AST, ALT, ALKPHOS, BILITOT, BILIDIR, IBILI in the last 72 hours. PT/INR Lab Results  Component Value Date   INR 0.98 06/25/2018     Ref. Range 06/25/2018 23:27  Iron Latest Ref Range: 45 -  182 ug/dL 51  UIBC Latest Units: ug/dL 330  TIBC Latest Ref Range: 250 - 450 ug/dL 381  Saturation Ratios Latest Ref Range: 17.9 - 39.5 % 13 (L)  Ferritin Latest Ref Range: 24 - 336 ng/mL 29  Folate Latest Ref Range: >5.9 ng/mL 11.7  Vitamin B12 Latest Ref Range: 180 - 914 pg/mL 425    Hepatitis Panel No results for input(s): HEPBSAG, HCVAB, HEPAIGM, HEPBIGM in the last 72 hours. C-Diff No components found for: CDIFF Lipase  No results found for: LIPASE  Drugs of Abuse  No results found for: LABOPIA, COCAINSCRNUR, LABBENZ, AMPHETMU, THCU, LABBARB   RADIOLOGY STUDIES: Dg Chest 2 View  Result Date: 06/25/2018 CLINICAL DATA:  Chest pain EXAM: CHEST - 2 VIEW COMPARISON:  November 22, 2014 FINDINGS: No edema or consolidation. Heart size and pulmonary vascularity are normal. No adenopathy. No pneumothorax. No bone lesions. IMPRESSION: No edema or consolidation. Electronically Signed   By: Lowella Grip III M.D.   On: 06/25/2018 17:18    IMPRESSION:   *   GI bleed with dark stools.  *  Chronic Plavix for history CAD and previous cardiac stent.  *    Normocytic anemia.  No evidence of iron deficiency, B12 or folate deficiency on labs.  *    Mild elevation in troponin I.    *    AKI, ?  Baseline level.  He sees nephrologist in [facility] [place] every 3 to 6 months.   PLAN:     *   I put orders in for upper endoscopy tomorrow and discussed with pt.  Final decision as to proceeding with an EGD will be made by Dr. Fuller Plan.  Patient can continue on clear liquids and PPI drip.   Keep holding the Plavix and ASA.   Pt concerned about cost of care as his insurance is not great, so anything we can do to keep costs low and limit days inpt will be helpful.     Azucena Freed  06/26/2018, 9:12 AM Phone [phone]     Attending Physician Note   I have taken a history, examined the patient and reviewed the chart. I agree with the Advanced Practitioner's note, impression and  recommendations.  GI bleed with melena and anemia. R/O ulcer, AVM, etc. PPI infusion and EGD tomorrow. The risks (including bleeding, perforation, infection, missed lesions, medication reactions and possible hospitalization or surgery if complications occur), benefits, and alternatives to endoscopy with possible biopsy and possible dilation were discussed with the patient and they consent to proceed.   Cardiac stent on Plavix and ASA. Hold both for now.   Lucio Edward, MD FACG [phone]

## 2018-06-26 NOTE — Progress Notes (Addendum)
Patient's CBG 75. Per patient blood glucose drops fast and he has not had anything to eat. Patient  NPO. Paged Chaney Malling. Verbal order to give oral glucose and recheck CBG. Will continue to monitor patient.

## 2018-06-27 ENCOUNTER — Encounter (HOSPITAL_COMMUNITY): Payer: Self-pay | Admitting: *Deleted

## 2018-06-27 ENCOUNTER — Encounter (HOSPITAL_COMMUNITY)
Admission: EM | Disposition: A | Discharge status: Home / Self Care | Payer: Self-pay | Source: Home / Self Care | Attending: Emergency Medicine

## 2018-06-27 ENCOUNTER — Observation Stay (HOSPITAL_COMMUNITY): Payer: 59 | Admitting: Certified Registered Nurse Anesthetist

## 2018-06-27 DIAGNOSIS — K921 Melena: Secondary | ICD-10-CM

## 2018-06-27 DIAGNOSIS — D509 Iron deficiency anemia, unspecified: Secondary | ICD-10-CM

## 2018-06-27 DIAGNOSIS — K222 Esophageal obstruction: Secondary | ICD-10-CM | POA: Diagnosis not present

## 2018-06-27 DIAGNOSIS — R079 Chest pain, unspecified: Secondary | ICD-10-CM | POA: Diagnosis not present

## 2018-06-27 DIAGNOSIS — K922 Gastrointestinal hemorrhage, unspecified: Secondary | ICD-10-CM | POA: Diagnosis not present

## 2018-06-27 DIAGNOSIS — I2510 Atherosclerotic heart disease of native coronary artery without angina pectoris: Secondary | ICD-10-CM | POA: Diagnosis not present

## 2018-06-27 DIAGNOSIS — D649 Anemia, unspecified: Secondary | ICD-10-CM | POA: Diagnosis not present

## 2018-06-27 HISTORY — PX: ESOPHAGOGASTRODUODENOSCOPY (EGD) WITH PROPOFOL: SHX5813

## 2018-06-27 LAB — TYPE AND SCREEN
ABO/RH(D): O POS
Antibody Screen: NEGATIVE
Unit division: 0

## 2018-06-27 LAB — HAPTOGLOBIN: Haptoglobin: 173 mg/dL (ref 32–363)

## 2018-06-27 LAB — CBC WITH DIFFERENTIAL/PLATELET
Abs Immature Granulocytes: 0.03 10*3/uL (ref 0.00–0.07)
Basophils Absolute: 0 10*3/uL (ref 0.0–0.1)
Basophils Relative: 0 %
Eosinophils Absolute: 0.1 10*3/uL (ref 0.0–0.5)
Eosinophils Relative: 1 %
HCT: 25.6 % — ABNORMAL LOW (ref 39.0–52.0)
Hemoglobin: 8.5 g/dL — ABNORMAL LOW (ref 13.0–17.0)
Immature Granulocytes: 1 %
Lymphocytes Relative: 31 %
Lymphs Abs: 1.6 10*3/uL (ref 0.7–4.0)
MCH: 30.2 pg (ref 26.0–34.0)
MCHC: 33.2 g/dL (ref 30.0–36.0)
MCV: 91.1 fL (ref 80.0–100.0)
Monocytes Absolute: 0.5 10*3/uL (ref 0.1–1.0)
Monocytes Relative: 10 %
Neutro Abs: 2.8 10*3/uL (ref 1.7–7.7)
Neutrophils Relative %: 57 %
Platelets: 227 10*3/uL (ref 150–400)
RBC: 2.81 MIL/uL — ABNORMAL LOW (ref 4.22–5.81)
RDW: 15 % (ref 11.5–15.5)
WBC: 5 10*3/uL (ref 4.0–10.5)
nRBC: 0.4 % — ABNORMAL HIGH (ref 0.0–0.2)

## 2018-06-27 LAB — BASIC METABOLIC PANEL
Anion gap: 8 (ref 5–15)
BUN: 22 mg/dL (ref 8–23)
CO2: 22 mmol/L (ref 22–32)
Calcium: 7.8 mg/dL — ABNORMAL LOW (ref 8.9–10.3)
Chloride: 108 mmol/L (ref 98–111)
Creatinine, Ser: 2.19 mg/dL — ABNORMAL HIGH (ref 0.61–1.24)
GFR calc Af Amer: 36 mL/min — ABNORMAL LOW (ref >60)
GFR calc non Af Amer: 31 mL/min — ABNORMAL LOW (ref >60)
Glucose, Bld: 197 mg/dL — ABNORMAL HIGH (ref 70–99)
Potassium: 3.6 mmol/L (ref 3.5–5.1)
Sodium: 138 mmol/L (ref 135–145)

## 2018-06-27 LAB — BPAM RBC
Blood Product Expiration Date: 202003142359
ISSUE DATE / TIME: 202002180230
Unit Type and Rh: 5100

## 2018-06-27 LAB — GLUCOSE, CAPILLARY
Glucose-Capillary: 116 mg/dL — ABNORMAL HIGH (ref 70–99)
Glucose-Capillary: 119 mg/dL — ABNORMAL HIGH (ref 70–99)
Glucose-Capillary: 61 mg/dL — ABNORMAL LOW (ref 70–99)
Glucose-Capillary: 64 mg/dL — ABNORMAL LOW (ref 70–99)
Glucose-Capillary: 82 mg/dL (ref 70–99)
Glucose-Capillary: 97 mg/dL (ref 70–99)

## 2018-06-27 LAB — HEMOGLOBIN A1C
Hgb A1c MFr Bld: 7.2 % — ABNORMAL HIGH (ref 4.8–5.6)
Mean Plasma Glucose: 160 mg/dL

## 2018-06-27 SURGERY — ESOPHAGOGASTRODUODENOSCOPY (EGD) WITH PROPOFOL
Anesthesia: Monitor Anesthesia Care

## 2018-06-27 MED ORDER — CALCIUM GLUCONATE-NACL 1-0.675 GM/50ML-% IV SOLN
1.0000 g | Freq: Once | INTRAVENOUS | Status: AC
  Administered 2018-06-27: 1000 mg via INTRAVENOUS
  Filled 2018-06-27: qty 50

## 2018-06-27 MED ORDER — PANTOPRAZOLE SODIUM 40 MG PO TBEC
40.0000 mg | DELAYED_RELEASE_TABLET | Freq: Two times a day (BID) | ORAL | Status: DC
  Administered 2018-06-27: 40 mg via ORAL
  Filled 2018-06-27: qty 1

## 2018-06-27 MED ORDER — FERROUS SULFATE 325 (65 FE) MG PO TABS: 325.0000 mg | ORAL_TABLET | Freq: Two times a day (BID) | ORAL | 0 refills | Status: DC

## 2018-06-27 MED ORDER — FERROUS SULFATE 325 (65 FE) MG PO TABS
325.0000 mg | ORAL_TABLET | Freq: Two times a day (BID) | ORAL | Status: DC
  Administered 2018-06-27: 325 mg via ORAL
  Filled 2018-06-27: qty 1

## 2018-06-27 MED ORDER — CLOPIDOGREL BISULFATE 75 MG PO TABS: 75.0000 mg | ORAL_TABLET | Freq: Every day | ORAL | 0 refills | Status: DC

## 2018-06-27 MED ORDER — PROPOFOL 10 MG/ML IV BOLUS
INTRAVENOUS | Status: DC | PRN
  Administered 2018-06-27: 40 mg via INTRAVENOUS
  Administered 2018-06-27: 30 mg via INTRAVENOUS

## 2018-06-27 MED ORDER — INSULIN GLARGINE 100 UNIT/ML ~~LOC~~ SOLN
15.0000 [IU] | Freq: Every day | SUBCUTANEOUS | Status: DC
  Administered 2018-06-27: 15 [IU] via SUBCUTANEOUS
  Filled 2018-06-27: qty 0.15

## 2018-06-27 MED ORDER — DEXTROSE-NACL 5-0.45 % IV SOLN
INTRAVENOUS | Status: DC
  Administered 2018-06-27: 08:00:00 via INTRAVENOUS

## 2018-06-27 MED ORDER — VITAMIN C 500 MG PO TABS: 500.0000 mg | ORAL_TABLET | Freq: Two times a day (BID) | ORAL | 0 refills | Status: DC

## 2018-06-27 MED ORDER — PANTOPRAZOLE SODIUM 40 MG PO TBEC: 40.0000 mg | DELAYED_RELEASE_TABLET | Freq: Two times a day (BID) | ORAL | 0 refills | Status: DC

## 2018-06-27 MED ORDER — PROPOFOL 500 MG/50ML IV EMUL
INTRAVENOUS | Status: DC | PRN
  Administered 2018-06-27: 100 ug/kg/min via INTRAVENOUS

## 2018-06-27 SURGICAL SUPPLY — 15 items

## 2018-06-27 NOTE — Op Note (Addendum)
[facility] Patient Name: Hayden Williams Procedure Date : 06/27/2018 MRN: [id number] Attending MD: Ladene Artist , MD Date of Birth: [birth year] CSN: [id number] Age: 63 Admit Type: Inpatient Procedure:                Upper GI endoscopy Indications:              Iron deficiency anemia, Heme positive stool, Melena Providers:                Pricilla Riffle. Fuller Plan, MD, Burtis Junes, RN, Charolette Child,                            Technician, Hedy Camara Referring MD:             [facility] Medicines:                Monitored Anesthesia Care Complications:            No immediate complications. Estimated Blood Loss:     Estimated blood loss: none. Procedure:                Pre-Anesthesia Assessment:                           - Prior to the procedure, a History and Physical                            was performed, and patient medications and                            allergies were reviewed. The patient's tolerance of                            previous anesthesia was also reviewed. The risks                            and benefits of the procedure and the sedation                            options and risks were discussed with the patient.                            All questions were answered, and informed consent                            was obtained. Prior Anticoagulants: The patient has                            taken Plavix (clopidogrel), last dose was 2 days                            prior to procedure. ASA Grade Assessment: III - A                            patient with severe systemic disease. After  reviewing the risks and benefits, the patient was                            deemed in satisfactory condition to undergo the                            procedure.                           After obtaining informed consent, the endoscope was                            passed under direct vision. Throughout the   procedure, the patient's blood pressure, pulse, and                            oxygen saturations were monitored continuously. The                            GIF-H190 ([id number]) Olympus gastroscope was                            introduced through the mouth, and advanced to the                            second part of duodenum. The upper GI endoscopy was                            accomplished without difficulty. The patient                            tolerated the procedure well. Scope In: Scope Out: Findings:      One superficial esophageal ulcer with no bleeding and no stigmata of       recent bleeding was found 36 to 37 cm from the incisors. The lesion was       8 mm in largest dimension.      LA Grade C (one or more mucosal breaks continuous between tops of 2 or       more mucosal folds, less than 75% circumference) esophagitis with no       bleeding was found in the distal esophagus.      One benign-appearing, intrinsic mild stenosis was found 37 cm from the       incisors. This stenosis measured 1.5 cm (inner diameter). The stenosis       was traversed.      The exam of the esophagus was otherwise normal.      A medium-sized hiatal hernia was present.      The exam of the stomach was otherwise normal.      The duodenal bulb and second portion of the duodenum were normal. Impression:               - Non-bleeding esophageal ulcer.                           - LA Grade C reflux esophagitis.                           -  Benign-appearing esophageal stenosis.                           - Medium-sized hiatal hernia.                           - Normal duodenal bulb and second portion of the                            duodenum.                           - No specimens collected. Recommendation:           - Return patient to hospital ward for ongoing care.                           - Resume previous diet.                           - Continue present medications except change                             pantoprazole to 40 mg po bid for 2 months then qam                            long term.                           - FeSO4 325 mg po bid after meals                           - Antireflux measures long term.                           - Resume Plavix (clopidogrel) at prior dose in 2                            days. Refer to managing physician for further                            adjustment of therapy.                           - OK for discharge home from GI standpoint.                           - Follow up with GI as outpatient in 1 month.                            Consider EGD in 2-3 months to assess ulcer healing.                           - Follow up with his PCP for mgmt of anemia. Procedure Code(s):        --- Professional ---  74944, Esophagogastroduodenoscopy, flexible,                            transoral; diagnostic, including collection of                            specimen(s) by brushing or washing, when performed                            (separate procedure) Diagnosis Code(s):        --- Professional ---                           K22.10, Ulcer of esophagus without bleeding                           K21.0, Gastro-esophageal reflux disease with                            esophagitis                           K22.2, Esophageal obstruction                           K44.9, Diaphragmatic hernia without obstruction or                            gangrene                           D50.9, Iron deficiency anemia, unspecified                           R19.5, Other fecal abnormalities                           K92.1, Melena (includes Hematochezia) CPT copyright 2018 American Medical Association. All rights reserved. The codes documented in this report are preliminary and upon coder review may  be revised to meet current compliance requirements. Ladene Artist, MD 06/27/2018 3:04:18 PM This report has been signed electronically. Number of  Addenda: 0

## 2018-06-27 NOTE — Anesthesia Postprocedure Evaluation (Signed)
Anesthesia Post Note  Patient: Hayden Williams  Procedure(s) Performed: ESOPHAGOGASTRODUODENOSCOPY (EGD) WITH PROPOFOL (N/A )     Patient location during evaluation: Endoscopy Anesthesia Type: MAC Level of consciousness: awake Pain management: pain level controlled Vital Signs Assessment: post-procedure vital signs reviewed and stable Respiratory status: spontaneous breathing Cardiovascular status: stable Postop Assessment: no apparent nausea or vomiting Anesthetic complications: no    Last Vitals:  Vitals:   06/27/18 1502 06/27/18 1509  BP: 90/60   Pulse: 76 83  Resp: 14 18  Temp:    SpO2: 99% 98%    Last Pain:  Vitals:   06/27/18 1502  TempSrc:   PainSc: 0-No pain                 Mak Bonny

## 2018-06-27 NOTE — Transfer of Care (Signed)
Immediate Anesthesia Transfer of Care Note  Patient: Hayden Williams  Procedure(s) Performed: ESOPHAGOGASTRODUODENOSCOPY (EGD) WITH PROPOFOL (N/A )  Patient Location: PACU and Endoscopy Unit  Anesthesia Type:MAC  Level of Consciousness: drowsy  Airway & Oxygen Therapy: Patient Spontanous Breathing and Patient connected to face mask oxygen  Post-op Assessment: Report given to RN and Post -op Vital signs reviewed and stable  Post vital signs: Reviewed and stable  Last Vitals:  Vitals Value Taken Time  BP 92/65 06/27/2018  2:52 PM  Temp    Pulse 79 06/27/2018  2:55 PM  Resp 14 06/27/2018  2:55 PM  SpO2 98 % 06/27/2018  2:55 PM  Vitals shown include unvalidated device data.  Last Pain:  Vitals:   06/27/18 1352  TempSrc: Oral  PainSc: 0-No pain         Complications: No apparent anesthesia complications

## 2018-06-27 NOTE — Progress Notes (Signed)
Hypoglycemic Event  CBG: 61  Treatment: 120 cc of juice  Symptoms: Asymptomatic  Follow-up CBG: Time:0123 CBG Result:082  Possible Reasons for Event: Not enough intake   Comments/MD notified: Patient resting in bed.No other complaints at this time    Hayden Williams

## 2018-06-27 NOTE — Interval H&P Note (Signed)
History and Physical Interval Note:  06/27/2018 2:20 PM  Hayden Williams  has presented today for surgery, with the diagnosis of GI bleed with black stool, acute anemia with symptoms.  The various methods of treatment have been discussed with the patient and family. After consideration of risks, benefits and other options for treatment, the patient has consented to  Procedure(s): ESOPHAGOGASTRODUODENOSCOPY (EGD) WITH PROPOFOL (N/A) as a surgical intervention .  The patient's history has been reviewed, patient examined, no change in status, stable for surgery.  I have reviewed the patient's chart and labs.  Questions were answered to the patient's satisfaction.     Pricilla Riffle. Fuller Plan

## 2018-06-27 NOTE — Anesthesia Preprocedure Evaluation (Signed)
Anesthesia Evaluation  Patient identified by MRN, date of birth, ID band Patient awake    Reviewed: Allergy & Precautions, NPO status   Airway Mallampati: II  TM Distance: >3 FB     Dental   Pulmonary    breath sounds clear to auscultation       Cardiovascular hypertension, + angina + CAD   Rhythm:Regular Rate:Normal     Neuro/Psych    GI/Hepatic History noted. CG   Endo/Other  diabetes  Renal/GU Renal disease     Musculoskeletal   Abdominal   Peds  Hematology  (+) anemia ,   Anesthesia Other Findings   Reproductive/Obstetrics                             Anesthesia Physical Anesthesia Plan  ASA: III  Anesthesia Plan: MAC   Post-op Pain Management:    Induction: Intravenous  PONV Risk Score and Plan: Propofol infusion  Airway Management Planned: Nasal Cannula and Simple Face Mask  Additional Equipment:   Intra-op Plan:   Post-operative Plan:   Informed Consent: I have reviewed the patients History and Physical, chart, labs and discussed the procedure including the risks, benefits and alternatives for the proposed anesthesia with the patient or authorized representative who has indicated his/her understanding and acceptance.     Dental advisory given  Plan Discussed with: CRNA and Anesthesiologist  Anesthesia Plan Comments:         Anesthesia Quick Evaluation

## 2018-06-27 NOTE — Discharge Summary (Addendum)
Hayden Williams, is a 63 y.o. male  DOB May 01, 1956  MRN [id number].  Admission date:  06/25/2018  Admitting Physician  Ivor Costa, MD  Discharge Date:  06/27/2018   Primary MD  Velna Hatchet, MD  Recommendations for primary care physician for things to follow:   Repeat CBC, BMP   Discharge Diagnosis  Principal Problem:   Symptomatic anemia Active Problems:   Type II diabetes mellitus with renal manifestations (HCC)   CKD (chronic kidney disease) stage 3, GFR 30-59 ml/min (HCC)   CAD (coronary artery disease)   Essential hypertension   GIB (gastrointestinal bleeding)   Chest pain   Elevated troponin   Melena   Iron deficiency anemia      Past Medical History:  Diagnosis Date  . CAD (coronary artery disease)    a. 11/2014 Cath/PCI: LM nl, LAD 50p, 60d, RI 40, LCX small, OM1 40, OM2 40, RCA 95d (2.25x20 Promus Premier DES), EF nl.  . CKD (chronic kidney disease), stage III ([place])   . Diabetes mellitus without complication ([place])   . Essential hypertension   . Hyperlipidemia     Past Surgical History:  Procedure Laterality Date  . ANKLE SURGERY Right   . CARDIAC CATHETERIZATION N/A 12/02/2014   Procedure: Left Heart Cath and Coronary Angiography;  Surgeon: Burnell Blanks, MD;  Location: [place] [facility];  Service: Cardiovascular;  Laterality: N/A;  . CARDIAC CATHETERIZATION N/A 12/02/2014   Procedure: Coronary Stent Intervention;  Surgeon: Burnell Blanks, MD;  Location: [place] [facility];  Service: Cardiovascular;  Laterality: N/A;  . CATARACT EXTRACTION    . CORONARY ANGIOPLASTY WITH STENT PLACEMENT    . ESOPHAGOGASTRODUODENOSCOPY (EGD) WITH PROPOFOL N/A 06/27/2018   Procedure: ESOPHAGOGASTRODUODENOSCOPY (EGD) WITH PROPOFOL;  Surgeon: Ladene Artist, MD;  Location: [facility] ENDOSCOPY;  Service: Endoscopy;  Laterality: N/A;  . EYE SURGERY    . INTRAOCULAR PROSTHESES INSERTION           HPI  from the history and physical done on the day of admission:         Hospital Course:   1.  Upper gastrointestinal bleeding with acute blood loss anemia: Symptoms had been present for approximately 1 week and he reported having 1-2 dark liquid bowel movements per day.  Denied any NSAID use due to kidney function, but was on Plavix and aspirin for history of coronary artery disease. Patient previous baseline hemoglobin was noted to be around 13.6, but dropped as low as 8.2.  Stool guaiacs are positive and labs revealed a significantly elevated BUN given concern for upper GI bleed.  He was started on IV fluids, Protonix drip, and transfused 1 unit of packed red blood cells.  Hemoglobin since 9.1-> 8.5 g/dL on 2/19.  Patient was placed on clear liquid diet and EGD performed on 2/19. -EGD revealed:    - Non-bleeding esophageal ulcer.                           -  LA Grade C reflux esophagitis.                           - Benign-appearing esophageal stenosis.                           - Medium-sized hiatal hernia.                           - Normal duodenal bulb and second portion of the                            duodenum.                           - No specimens collected. -Post EGD recommendations:                           - Resume previous diet.                           - Continue present medications except change                            pantoprazole to 40 mg po bid for 2 months then qam                            long term.                           - FeSO4 325 mg po bid after meals                           - Antireflux measures long term.                           - Resume Plavix (clopidogrel) at prior dose in 2                            days. Refer to managing physician for further                            adjustment of therapy.                           - OK for discharge home from GI standpoint.                           - Follow up with Dr. Fuller Plan in 1 month,                            as outpatient. Consider EGD in 2-3                            months to assess healing.  2.  Acute kidney injury superimposed on chronic kidney disease stage III:  Resolving. Patient is followed by nephrology in the outpatient setting and notes that is creatinine had previously been around 2.1, but was elevated up to 2.64 during hospital stay with BUN elevated at 41.  Suspect this was related to acute blood loss.  Creatinine on 2/19 trending down to 2.19 after IV fluids. Follow-up recommended to monitor kidney function.  3.  Chest pain and elevated troponin, history of coronary artery disease: Acute.  Prior to arrival patient complaining of chest discomfort.  Previous history of stent placement in 2016 on Plavix and aspirin.  Troponins mildly elevated up to 0.05.  EKG without significant ischemic changes.  Suspect symptoms likely related to demand ischemia with blood loss.  Cardiology had been consulted.  At discharge patient was recommended to restart only on Plavix in 2 days.  4.  Diabetes mellitus type 2: Patient on Victoza and glargine insulin 44 units subcutaneous daily at home.  He had episodes of hypogylcemia prior to his EGD procedure as patient was NPO, but was corrected with D5-0.45% NS IVs. He was continued on all of his home medications at discharge and recommended to check blood sugars regularly and monitor for hypoglycemia.  5.  Essential hypertension: Blood pressures were stable. Continued metoprolol and amlodipine.    Follow UP  Follow-up Information    Velna Hatchet, MD.   Specialty:  Internal Medicine Why:  DJ will call patient Contact information: [place] [address] [phone]        Schedule an appointment as soon as possible for a visit with Ladene Artist, MD.   Specialty:  Gastroenterology Why:  Please call office tomorrow and schedule a follow-up appointment for 1 month with Dr. start. Contact information: [address] [address] [phone]             Consults obtained : Dr. Fuller Plan -GI Discharge Condition: Stable  Diet and Activity recommendation: See Discharge Instructions below  Discharge Instructions    Discharge Instructions    Discharge instructions   Complete by:  As directed    During the EGD procedure you are found to have stomach ulcer that was not actively bleeding, and signs of inflammation and irritation of your esophagus.  It is recommended that you take pantoprazole to 40 mg by mouth twice daily for 2 months, then decrease to 40 mg by mouth once daily in the morning indefinitely.  Iron levels were noted to be at the low end of normal, and is recommended that she take ferrous sulfate 325 mg po bid after meals.  Taking 500 mg of vitamin C with ferrous sulfate helps decrease indigestion complaints that are common.  It is okay resume Plavix (clopidogrel) at prior dose in 2 days, but do not take aspirin. Dr. Fuller Plan is happy to follow-up with you in the outpatient setting.  He recommends calling his office at the number provided to schedule an appointment in 1 month, and will consider repeat EGD in 2-3 months to assess ulcer healing.  Prescriptions for these medications as well as Plavix have been sent to your pharmacy. Please monitor your stools for signs of repeat bleeding.  We would like you to have a follow-up appointment with your primary care provider within 7 to 10 days.  At that appointment please request that a repeat CBC and BMP be obtained to make sure blood counts and kidney function are stable.  At discharge your hemoglobin was noted to be 8.5 and creatinine 2.19 with BUN 22.  Please continue outpatient follow-up with your cardiologist to let them know the recent changes in medications because of the recent bleed.  Okay to continue a heart healthy and carb modified diet as tolerated.  Note that you had some low blood sugars during your hospital stay.  Please monitor  blood sugars regularly and notify your primary care physician if symptoms recur as this may warrant changes in your diabetic medication regimen.        Discharge Medications     Allergies as of 06/27/2018      Reactions   Peanut Oil Anaphylaxis, Swelling   Peanut-containing Drug Products Anaphylaxis, Swelling   Lisinopril Other (See Comments), Cough   Increased BUN and creatinine, also   Niacin Other (See Comments)   Reaction not recalled      Medication List    STOP taking these medications   aspirin 81 MG chewable tablet   VITAMIN A PO     TAKE these medications   amLODipine 10 MG tablet Commonly known as:  NORVASC Take 10 mg by mouth at bedtime.   atorvastatin 80 MG tablet Commonly known as:  LIPITOR TAKE 1 TABLET BY MOUTH EVERY DAY   BASAGLAR KWIKPEN 100 UNIT/ML Sopn Inject 44 Units into the skin daily after supper.   clopidogrel 75 MG tablet Commonly known as:  PLAVIX Take 1 tablet (75 mg total) by mouth daily. Please keep upcoming appt in April with Dr. Angelena Form for future refills. Thank you Start taking on:  June 30, 2018 What changed:  These instructions start on June 30, 2018. If you are unsure what to do until then, ask your doctor or other care provider.   DEXCOM G6 SENSOR Misc 1 patch See admin instructions. Place 1 new sensor every 10 days to obtain continuous glucose readings   ferrous sulfate 325 (65 FE) MG tablet Take 1 tablet (325 mg total) by mouth 2 (two) times daily with a meal.   GOLD BOND FOOT Crea Apply 1 application topically daily as needed (cracked feet).   ketoconazole 2 % cream Commonly known as:  NIZORAL Apply 1 application topically as needed (to right foot several times a week for irritation).   losartan-hydrochlorothiazide 100-25 MG tablet Commonly known as:  HYZAAR Take 1 tablet by mouth daily.   metoprolol tartrate 100 MG tablet Commonly known as:  LOPRESSOR Take 1 tablet (100 mg total) by mouth 2 (two) times  daily. Please schedule overdue appt for future refills. 2nd attempt. What changed:  additional instructions   MITIGARE 0.6 MG Caps Generic drug:  Colchicine Take 1 capsule by mouth daily as needed (for gout flares).   neomycin-bacitracin-polymyxin ointment Commonly known as:  NEOSPORIN Apply 1 application topically as needed for wound care (of right foot). apply to eye   nitroGLYCERIN 0.4 MG SL tablet Commonly known as:  NITROSTAT PLACE 1 TABLET UNDER THE TONGUE EVERY 5 MINUTES AS NEEDED FOR CHEST PAIN What changed:  See the new instructions.   NOVOLOG FLEXPEN 100 UNIT/ML FlexPen Generic drug:  insulin aspart Inject 2-11 Units into the skin See admin instructions. Inject 2-11 units into the skin two times a day with meals, per sliding scale   pantoprazole 40 MG tablet Commonly known as:  PROTONIX Take 1 tablet (40 mg total) by mouth 2 (two) times daily before a meal.   pioglitazone 15 MG tablet Commonly known as:  ACTOS Take 15 mg by mouth daily.   VICTOZA 18 MG/3ML Sopn Generic drug:  liraglutide Inject 1.8 mg  into the skin daily before lunch.   vitamin C 500 MG tablet Commonly known as:  ASCORBIC ACID Take 1 tablet (500 mg total) by mouth 2 (two) times daily. Take with ferrous sulfate to decrease upset stomach   Vitamin D 50 MCG (2000 UT) Caps Take 2,000 Units by mouth daily.       Major procedures and Radiology Reports - PLEASE review detailed and final reports for all details, in brief -     Dg Chest 2 View  Result Date: 06/25/2018 CLINICAL DATA:  Chest pain EXAM: CHEST - 2 VIEW COMPARISON:  November 22, 2014 FINDINGS: No edema or consolidation. Heart size and pulmonary vascularity are normal. No adenopathy. No pneumothorax. No bone lesions. IMPRESSION: No edema or consolidation. Electronically Signed   By: Lowella Grip III M.D.   On: 06/25/2018 17:18    Micro Results    No results found for this or any previous visit (from the past 240  hour(s)).     Today   Subjective    Arsenio Katz  he notes that he had 3 formed stools yesterday of normal color.  Denies any recurrence of bleeding.  Overnight patient noted to have recurrent episodes of hypoglycemia. Objective   Blood pressure 103/74, pulse 81, temperature 98.9 F (37.2 C), temperature source Oral, resp. rate 12, height 5\' 11"  (1.803 m), weight 100 kg, SpO2 97 %.  No intake or output data in the 24 hours ending 06/28/18 1733  Exam  Constitutional: NAD, calm, comfortable Eyes: Disconjugate gaze, lids and conjunctivae normal ENMT: Mucous membranes are moist. Posterior pharynx clear of any exudate or lesions. .  Neck: normal, supple, no masses, no thyromegaly Respiratory: clear to auscultation bilaterally, no wheezing, no crackles. Normal respiratory effort. No accessory muscle use.  Cardiovascular: Regular rate and rhythm, no murmurs / rubs / gallops. No extremity edema. 2+ pedal pulses. No carotid bruits.  Abdomen: no tenderness, no masses palpated. No hepatosplenomegaly. Bowel sounds positive.  Musculoskeletal: no clubbing / cyanosis. No joint deformity upper and lower extremities. Good ROM, no contractures. Normal muscle tone.  Skin: no rashes, lesions, ulcers. No induration Neurologic: CN 2-12 grossly intact. Sensation intact, DTR normal. Strength 5/5 in all 4.  Psychiatric: Normal judgment and insight. Alert and oriented x 3. Normal mood.    Data Review   CBC w Diff:  Lab Results  Component Value Date   WBC 5.0 06/27/2018   HGB 8.5 (L) 06/27/2018   HCT 25.6 (L) 06/27/2018   PLT 227 06/27/2018   LYMPHOPCT 31 06/27/2018   MONOPCT 10 06/27/2018   EOSPCT 1 06/27/2018   BASOPCT 0 06/27/2018    CMP:  Lab Results  Component Value Date   NA 138 06/27/2018   K 3.6 06/27/2018   CL 108 06/27/2018   CO2 22 06/27/2018   BUN 22 06/27/2018   CREATININE 2.19 (H) 06/27/2018   CREATININE 2.28 (H) 09/11/2015   PROT 7.3 09/11/2015   ALBUMIN 3.9 09/11/2015    BILITOT 0.4 09/11/2015   ALKPHOS 63 09/11/2015   AST 23 09/11/2015   ALT 25 09/11/2015  .   Total Time in preparing paper work, data evaluation and todays exam - 35 minutes  Norval Morton M.D on 06/28/2018 at 5:33 PM  [facility]   Office  [phone]

## 2018-07-05 ENCOUNTER — Encounter: Payer: Self-pay | Admitting: Gastroenterology

## 2018-07-05 NOTE — Progress Notes (Signed)
Chief Complaint  Patient presents with  . Follow-up    CAD   History of Present Illness: 63 yo male with a history of CAD, diabetes, hypertension, hyperlipidemia, stage III chronic kidney disease and sleep apnea who is here today for cardiac follow up. He was admitted to [facility] July 2016 with unstable angina. Cardiac cath with severe distal RCA disease with a 50% proximal LAD stenosis, 60% distal LAD stenosis and 40% ramus intermediate stenosis. The RCA was treated with a drug-eluting stent. Nuclear stress test in February 2019 with no ischemia. He was admitted to [facility] February 2020 with dyspnea and black stools and was found to be anemic (Hgb 8.2). EGD on 06/27/18 with healed esophageal ulcer, esophagitis, hiatal hernia. He was transfused 1 unit pRBCs. He had acute worsening of his chronic kidney disease. He had some chest pressure when he was anemic. Troponin 0.05. He was discharged feeling much better on a PPI.   He is here today for follow up. The patient denies any chest pain, dyspnea, palpitations, lower extremity edema, orthopnea, PND, dizziness, near syncope or syncope. He feels much better since discharge.   Primary Care Physician: Velna Hatchet, MD  Past Medical History:  Diagnosis Date  . CAD (coronary artery disease)    a. 11/2014 Cath/PCI: LM nl, LAD 50p, 60d, RI 40, LCX small, OM1 40, OM2 40, RCA 95d (2.25x20 Promus Premier DES), EF nl.  . CKD (chronic kidney disease), stage III ([place])   . Diabetes mellitus without complication ([place])   . Essential hypertension   . Hyperlipidemia     Past Surgical History:  Procedure Laterality Date  . ANKLE SURGERY Right   . CARDIAC CATHETERIZATION N/A 12/02/2014   Procedure: Left Heart Cath and Coronary Angiography;  Surgeon: Burnell Blanks, MD;  Location: [place] [facility];  Service: Cardiovascular;  Laterality: N/A;  . CARDIAC CATHETERIZATION N/A 12/02/2014   Procedure: Coronary Stent Intervention;  Surgeon: Burnell Blanks, MD;  Location: [place] [facility];  Service: Cardiovascular;  Laterality: N/A;  . CATARACT EXTRACTION    . CORONARY ANGIOPLASTY WITH STENT PLACEMENT    . ESOPHAGOGASTRODUODENOSCOPY (EGD) WITH PROPOFOL N/A 06/27/2018   Procedure: ESOPHAGOGASTRODUODENOSCOPY (EGD) WITH PROPOFOL;  Surgeon: Ladene Artist, MD;  Location: [facility] ENDOSCOPY;  Service: Endoscopy;  Laterality: N/A;  . EYE SURGERY    . INTRAOCULAR PROSTHESES INSERTION      Current Outpatient Medications  Medication Sig Dispense Refill  . amLODipine (NORVASC) 10 MG tablet Take 10 mg by mouth at bedtime.   3  . atorvastatin (LIPITOR) 80 MG tablet Take 1 tablet (80 mg total) by mouth daily. 90 tablet 3  . Cholecalciferol (VITAMIN D) 2000 UNITS CAPS Take 2,000 Units by mouth daily.    . clopidogrel (PLAVIX) 75 MG tablet Take 1 tablet (75 mg total) by mouth daily. 90 tablet 3  . Continuous Blood Gluc Sensor (DEXCOM G6 SENSOR) MISC 1 patch See admin instructions. Place 1 new sensor every 10 days to obtain continuous glucose readings    . ferrous sulfate 325 (65 FE) MG tablet Take 1 tablet (325 mg total) by mouth 2 (two) times daily with a meal. 60 tablet 0  . Insulin Glargine (BASAGLAR KWIKPEN) 100 UNIT/ML SOPN Inject 44 Units into the skin daily after supper.   3  . ketoconazole (NIZORAL) 2 % cream Apply 1 application topically as needed (to right foot several times a week for irritation).     Marland Kitchen losartan-hydrochlorothiazide (HYZAAR) 100-25 MG tablet  Take 1 tablet by mouth daily.  4  . metoprolol tartrate (LOPRESSOR) 100 MG tablet Take 1 tablet (100 mg total) by mouth 2 (two) times daily. 180 tablet 3  . MITIGARE 0.6 MG CAPS Take 1 capsule by mouth daily as needed (for gout flares).     . nitroGLYCERIN (NITROSTAT) 0.4 MG SL tablet PLACE 1 TABLET UNDER THE TONGUE EVERY 5 MINUTES AS NEEDED FOR CHEST PAIN (Patient taking differently: Place 0.4 mg under the tongue every 5 (five) minutes as needed for chest pain. ) 25 tablet 2  . NOVOLOG  FLEXPEN 100 UNIT/ML FlexPen Inject 2-11 Units into the skin See admin instructions. Inject 2-11 units into the skin two times a day with meals, per sliding scale    . pantoprazole (PROTONIX) 40 MG tablet Take 1 tablet (40 mg total) by mouth 2 (two) times daily before a meal. 60 tablet 0  . pioglitazone (ACTOS) 15 MG tablet Take 15 mg by mouth daily.    . Podiatric Products (GOLD BOND FOOT) CREA Apply 1 application topically daily as needed (cracked feet).     Donna Bernard 18 MG/3ML SOPN Inject 1.8 mg into the skin daily before lunch.     . vitamin C (ASCORBIC ACID) 500 MG tablet Take 1 tablet (500 mg total) by mouth 2 (two) times daily. Take with ferrous sulfate to decrease upset stomach 60 tablet 0   No current facility-administered medications for this visit.     Allergies  Allergen Reactions  . Peanut Oil Anaphylaxis and Swelling  . Peanut-Containing Drug Products Anaphylaxis and Swelling  . Lisinopril Other (See Comments) and Cough    Increased BUN and creatinine, also    . Niacin Other (See Comments)    Reaction not recalled     Social History   Socioeconomic History  . Marital status: Married    Spouse name: [place]  . Number of children: 3  . Years of education: MBA  . Highest education level: Not on file  Occupational History  . Not on file  Social Needs  . Financial resource strain: Not on file  . Food insecurity:    Worry: Not on file    Inability: Not on file  . Transportation needs:    Medical: Not on file    Non-medical: Not on file  Tobacco Use  . Smoking status: Never Smoker  . Smokeless tobacco: Never Used  Substance and Sexual Activity  . Alcohol use: No    Alcohol/week: 0.0 standard drinks  . Drug use: No  . Sexual activity: Not on file  Lifestyle  . Physical activity:    Days per week: Not on file    Minutes per session: Not on file  . Stress: Not on file  Relationships  . Social connections:    Talks on phone: Not on file    Gets together:  Not on file    Attends religious service: Not on file    Active member of club or organization: Not on file    Attends meetings of clubs or organizations: Not on file    Relationship status: Not on file  . Intimate partner violence:    Fear of current or ex partner: Not on file    Emotionally abused: Not on file    Physically abused: Not on file    Forced sexual activity: Not on file  Other Topics Concern  . Not on file  Social History Narrative   Drinks caffeien 4-5 times a month  Family History  Problem Relation Age of Onset  . Diabetes Mother   . Heart disease Mother   . Sleep apnea Mother   . Cancer Father   . Diabetes Father   . Diabetes Sister   . Cancer Brother   . Diabetes Brother     Review of Systems:  As stated in the HPI and otherwise negative.   BP 124/70   Pulse 75   Ht 5\' 11"  (1.803 m)   Wt 100.2 kg   SpO2 97%   BMI 30.82 kg/m   Physical Examination: General: Well developed, well nourished, NAD  HEENT: OP clear, mucus membranes moist  SKIN: warm, dry. No rashes. Neuro: No focal deficits  Musculoskeletal: Muscle strength 5/5 all ext  Psychiatric: Mood and affect normal  Neck: No JVD, no carotid bruits, no thyromegaly, no lymphadenopathy.  Lungs:Clear bilaterally, no wheezes, rhonci, crackles Cardiovascular: Regular rate and rhythm. No murmurs, gallops or rubs. Abdomen:Soft. Bowel sounds present. Non-tender.  Extremities: No lower extremity edema. Pulses are 2 + in the bilateral DP/PT.  EKG:  EKG is not ordered today. The ekg ordered today demonstrates   Recent Labs: 06/27/2018: BUN 22; Creatinine, Ser 2.19; Hemoglobin 8.5; Platelets 227; Potassium 3.6; Sodium 138   Lipid Panel:  Lipid Panel     Component Value Date/Time   CHOL 98 06/26/2018 0524   TRIG 123 06/26/2018 0524   HDL 27 (L) 06/26/2018 0524   CHOLHDL 3.6 06/26/2018 0524   VLDL 25 06/26/2018 0524   LDLCALC 46 06/26/2018 0524     Wt Readings from Last 3 Encounters:    07/06/18 100.2 kg  06/27/18 100 kg  06/23/17 97.5 kg     Other studies Reviewed: Additional studies/ records that were reviewed today include: . Review of the above records demonstrates:    Assessment and Plan:    1. Coronary artery disease without angina: He had recent chest pain when anemic but no pain since his anemia resolved. Nuclear stress test in February 2019 with no ischemia. Will continue Plavix, beta blocker, and statin.   2. Essential hypertension:  BP is well controlled.   3. Hyperlipidemia: LDL at goal. Continue statin  4. Stage III chronic kidney disease  5. Insulin-dependent diabetes mellitus: This is followed closely by his primary care provider and endocrine.  Current medicines are reviewed at length with the patient today.  The patient does not have concerns regarding medicines.  The following changes have been made:  no change  Labs/ tests ordered today include:   No orders of the defined types were placed in this encounter.   Disposition:   FU with me in 12  months  Signed, Lauree Chandler, MD 07/06/2018 8:58 AM    [facility] [address], [place], [place]  [address] Phone: [phone]; Fax: [phone]

## 2018-07-06 ENCOUNTER — Encounter: Payer: Self-pay | Admitting: Cardiovascular Disease

## 2018-07-06 ENCOUNTER — Ambulatory Visit: Payer: 59 | Admitting: Cardiovascular Disease

## 2018-07-06 VITALS — BP 124/70 | HR 75 | Ht 71.0 in | Wt 221.0 lb

## 2018-07-06 DIAGNOSIS — I10 Essential (primary) hypertension: Secondary | ICD-10-CM | POA: Diagnosis not present

## 2018-07-06 DIAGNOSIS — E7800 Pure hypercholesterolemia, unspecified: Secondary | ICD-10-CM

## 2018-07-06 DIAGNOSIS — I2510 Atherosclerotic heart disease of native coronary artery without angina pectoris: Secondary | ICD-10-CM

## 2018-07-06 MED ORDER — CLOPIDOGREL BISULFATE 75 MG PO TABS: 75.0000 mg | ORAL_TABLET | Freq: Every day | ORAL | 3 refills | Status: DC

## 2018-07-06 MED ORDER — ATORVASTATIN CALCIUM 80 MG PO TABS: 80.0000 mg | ORAL_TABLET | Freq: Every day | ORAL | 3 refills | Status: DC

## 2018-07-06 MED ORDER — METOPROLOL TARTRATE 100 MG PO TABS: 100.0000 mg | ORAL_TABLET | Freq: Two times a day (BID) | ORAL | 3 refills | Status: DC

## 2018-07-06 NOTE — Patient Instructions (Signed)
Medication Instructions:  Your physician recommends that you continue on your current medications as directed. Please refer to the Current Medication list given to you today.  If you need a refill on your cardiac medications before your next appointment, please call your pharmacy.   Lab work: none If you have labs (blood work) drawn today and your tests are completely normal, you will receive your results only by: Marland Kitchen MyChart Message (if you have MyChart) OR . A paper copy in the mail If you have any lab test that is abnormal or we need to change your treatment, we will call you to review the results.  Testing/Procedures: none  Follow-Up: At [facility], you and your health needs are our priority.  As part of our continuing mission to provide you with exceptional heart care, we have created designated Provider Care Teams.  These Care Teams include your primary Cardiologist (physician) and Advanced Practice Providers (APPs -  Physician Assistants and Nurse Practitioners) who all work together to provide you with the care you need, when you need it. You will need a follow up appointment in 12 months.  Please call our office 2 months in advance to schedule this appointment.  You may see No primary care provider on file. or one of the following Advanced Practice Providers on your designated Care Team:   Lyda Jester, PA-C Melina Copa, PA-C . Ermalinda Barrios, PA-C  Any Other Special Instructions Will Be Listed Below (If Applicable).

## 2018-07-30 ENCOUNTER — Other Ambulatory Visit: Payer: Self-pay

## 2018-07-30 MED ORDER — CLOPIDOGREL BISULFATE 75 MG PO TABS: 75.0000 mg | ORAL_TABLET | Freq: Every day | ORAL | 3 refills | Status: DC

## 2018-08-02 ENCOUNTER — Telehealth: Payer: Self-pay

## 2018-08-02 ENCOUNTER — Telehealth (INDEPENDENT_AMBULATORY_CARE_PROVIDER_SITE_OTHER): Payer: 59 | Admitting: Gastroenterology

## 2018-08-02 ENCOUNTER — Other Ambulatory Visit: Payer: Self-pay

## 2018-08-02 DIAGNOSIS — K2211 Ulcer of esophagus with bleeding: Secondary | ICD-10-CM | POA: Diagnosis not present

## 2018-08-02 DIAGNOSIS — D509 Iron deficiency anemia, unspecified: Secondary | ICD-10-CM

## 2018-08-02 DIAGNOSIS — K2100 Gastro-esophageal reflux disease with esophagitis, without bleeding: Secondary | ICD-10-CM

## 2018-08-02 NOTE — Telephone Encounter (Signed)
[facility] Pre-operative Risk Assessment     Request for surgical clearance:     Endoscopy Procedure  What type of surgery is being performed?     Upper Endoscopy  When is this surgery scheduled?     09/07/18  What type of clearance is required ?   Pharmacy  Are there any medications that need to be held prior to surgery and how long? Plavix x 5 days  Practice name and name of physician performing surgery?      [facility]  What is your office phone and fax number?      Phone- [phone]  [phone]  Anesthesia type (None, local, MAC, general) ?       MAC

## 2018-08-02 NOTE — Progress Notes (Signed)
    History of Present Illness: This is a 63 year old male seen as a hospital consultation for melena and iron deficiency anemia in February.  EGD findings below.  He has done very well since discharge and has no gastrointestinal complaints except for dark stools on iron.  He denies black, tarry, sticky or frequent stools.  He has no other gastrointestinal complaints.  He resumed Plavix as recommended.  He remains on pantoprazole twice daily and iron twice daily as recommended.  He reports a hemoglobin obtained by his PCP recently was 11.  He reports visits to his PCP and endocrinologist since discharge from the hospital.  EGD 06/27/2018 - Non-bleeding esophageal ulcer. - LA Grade C reflux esophagitis. - Benign-appearing esophageal stenosis. - Medium-sized hiatal hernia. - Normal duodenal bulb and second portion of the duodenum. - No specimens collected.  Current Medications, Allergies, Past Medical History, Past Surgical History, Family History and Social History were reviewed in [employer] record.  Physical Exam: Telephone visit - not done   Assessment and Recommendations:  1.  GERD with LA class C esophagitis, esophageal ulcer, asymptomatic esophageal stricture.  Advised to closely follow antireflux measures - will mail information to him. Continue pantoprazole 40 mg twice daily until EGD is completed.  Schedule EGD to assess healing of esophageal ulcer and esophagitis in late April or May. The risks (including bleeding, perforation, infection, missed lesions, medication reactions and possible hospitalization or surgery if complications occur), benefits, and alternatives to endoscopy with possible biopsy and possible dilation were discussed with the patient and they consent to proceed.   2. Hold Plavix 5 days before procedure - will instruct when and how to resume after procedure. Low but real risk of cardiovascular event such as heart attack, stroke, embolism,  thrombosis or ischemia/infarct of other organs off Plavix explained and need to seek urgent help if this occurs. The patient consents to proceed. Will communicate by phone or EMR with patient's prescribing provider to confirm that holding Plavix is reasonable in this case.   3. Iron deficiency anemia. Hgb improved to 11 by patient report. Continue FeSO4 bid with meals. Further mgmt per his PCP, Dr. Ardeth Perfect.   4. CRC screening in 03/2017. Normal colonoscopy per patient report. Screening colonoscopy in 03/2027.   This service was provided via telemedicine.  The patient was located at home by himself.  The provider was located in my office by myself.  The patient did consent to this telephone visit and is aware of possible charges for this visit.  The were no other persons participating in this telemedicine service.  Time spent on call: 12 minutes

## 2018-08-02 NOTE — Patient Instructions (Signed)
Continue pantoprazole one tablet by mouth twice daily.  Patient advised to avoid spicy, acidic, citrus, chocolate, mints, fruit and fruit juices.  Limit the intake of caffeine, alcohol and Soda.  Don't exercise too soon after eating.  Don't lie down within 3-4 hours of eating.  Elevate the head of your bed.  You have been scheduled for an endoscopy. Please follow written instructions. If you use inhalers (even only as needed), please bring them with you on the day of your procedure. Your physician has requested that you go to www.startemmi.com and enter the access code given to you at your visit today. This web site gives a general overview about your procedure. However, you should still follow specific instructions given to you by our office regarding your preparation for the procedure.

## 2018-08-02 NOTE — Telephone Encounter (Signed)
Dr. Angelena Form, can this pt stop Plavix X 5 days for upper endoscopy.   Please route response back to P CV DIV PREOP

## 2018-08-06 NOTE — Telephone Encounter (Signed)
Left a message for patient to return my call. 

## 2018-08-06 NOTE — Telephone Encounter (Signed)
OK to hold Plavix 5 days before his surgery.   Lauree Chandler

## 2018-08-06 NOTE — Telephone Encounter (Signed)
   Primary Cardiologist: Lauree Chandler, MD  Chart reviewed as part of pre-operative protocol coverage. Patient was contacted 08/06/2018 in reference to pre-operative risk assessment for pending surgery as outlined below.  ALDAIR RICKEL was last seen on 07/06/18 by Dr. Angelena Form.  Since that day, ANTON CHERAMIE has done well with no chest pain or SOB.  Ok to hold plavix for 5 days prior to procedure and resume as soon as possible afterwards. .   Therefore, based on ACC/AHA guidelines, the patient would be at acceptable risk for the planned procedure without further cardiovascular testing.   I will route this recommendation to the requesting party via Epic fax function and remove from pre-op pool.  Please call with questions.  Cecilie Kicks, NP 08/06/2018, 9:45 AM

## 2018-08-08 NOTE — Telephone Encounter (Signed)
Left message for patient to return my call.

## 2018-08-10 ENCOUNTER — Telehealth: Payer: Self-pay | Admitting: Nurse Practitioner

## 2018-08-10 MED ORDER — METOPROLOL TARTRATE 100 MG PO TABS: 100.0000 mg | ORAL_TABLET | Freq: Two times a day (BID) | ORAL | 3 refills | Status: DC

## 2018-08-10 NOTE — Telephone Encounter (Signed)
   Pt called to report that [facility] told him that they did not receive previously sent metoprolol rx.  I reviewed chart and it appeared to have gone through appropriately.  Pt would now like for that to be sent to [facility], as [facility] says [facility] will not fill.  I sent Rx for metoprolol 100mg  1 PO BID, #180, three refills to [facility] @ his request.  Caller verbalized understanding and was grateful for the call back.  Murray Hodgkins, NP 08/10/2018, 5:57 PM

## 2018-08-14 NOTE — Telephone Encounter (Signed)
Left a message for patient to return my call. 

## 2018-08-21 NOTE — Telephone Encounter (Signed)
Left a message for patient to return my call stating he has confirmed he understands he can hold Plavix 5 days prior to his procedure scheduled.

## 2018-08-23 ENCOUNTER — Telehealth: Payer: Self-pay | Admitting: *Deleted

## 2018-08-23 ENCOUNTER — Ambulatory Visit: Payer: 59 | Admitting: Cardiovascular Disease

## 2018-08-23 NOTE — Telephone Encounter (Signed)
No answer so left message for the patient to notify him that his EGD will be rescheduled to [facility] May due to COVID 19. PT is to call with questions or concerns. SM

## 2018-08-27 NOTE — Telephone Encounter (Signed)
Procedure cancelled at this time due to covid-19 restrictions.

## 2018-09-07 ENCOUNTER — Encounter: Payer: 59 | Admitting: Gastroenterology

## 2018-09-12 ENCOUNTER — Telehealth: Payer: Self-pay | Admitting: *Deleted

## 2018-09-12 NOTE — Telephone Encounter (Signed)
Called patient to schedule previously cancelled endoscopy due to Covid-19. No answer, left message to call me to reschedule, review instructions. Plavix to be held for 5 days prior to procedure.

## 2018-09-17 NOTE — Telephone Encounter (Signed)
LMOM to call back to reschedule EGD previously cancelled due to Covid-19.

## 2018-09-28 DIAGNOSIS — E1165 Type 2 diabetes mellitus with hyperglycemia: Secondary | ICD-10-CM | POA: Diagnosis not present

## 2018-09-28 DIAGNOSIS — E782 Mixed hyperlipidemia: Secondary | ICD-10-CM | POA: Diagnosis not present

## 2018-09-28 DIAGNOSIS — Z794 Long term (current) use of insulin: Secondary | ICD-10-CM | POA: Diagnosis not present

## 2018-10-30 ENCOUNTER — Encounter: Payer: Self-pay | Admitting: Gastroenterology

## 2018-11-06 ENCOUNTER — Encounter

## 2019-01-09 ENCOUNTER — Other Ambulatory Visit: Payer: Self-pay | Admitting: Cardiovascular Disease

## 2019-06-17 ENCOUNTER — Other Ambulatory Visit: Payer: Self-pay | Admitting: Cardiovascular Disease

## 2019-07-17 ENCOUNTER — Other Ambulatory Visit: Payer: Self-pay | Admitting: Cardiovascular Disease

## 2019-08-03 ENCOUNTER — Ambulatory Visit: Payer: Self-pay | Attending: Internal Medicine

## 2019-08-03 DIAGNOSIS — Z23 Encounter for immunization: Secondary | ICD-10-CM

## 2019-08-03 NOTE — Progress Notes (Signed)
   Covid-19 Vaccination Clinic  Name:  Hayden Williams    MRN: [id number] DOB: 27-Oct-1955  08/03/2019  [name] was observed post Covid-19 immunization for 15 minutes without incident. He was provided with Vaccine Information Sheet and instruction to access the V-Safe system.   [name] was instructed to call 911 with any severe reactions post vaccine: Williams Kitchen Difficulty breathing  . Swelling of face and throat  . A fast heartbeat  . A bad rash all over body  . Dizziness and weakness   Immunizations Administered    Name Date Dose VIS Date Route   Pfizer COVID-19 Vaccine 08/03/2019 10:01 AM 0.3 mL 04/19/2019 Intramuscular   Manufacturer: [place]   Lot: [id number]   [place]: [id number]

## 2019-08-06 ENCOUNTER — Other Ambulatory Visit: Payer: Self-pay | Admitting: Cardiovascular Disease

## 2019-08-22 ENCOUNTER — Other Ambulatory Visit: Payer: Self-pay | Admitting: Cardiovascular Disease

## 2019-08-22 MED ORDER — METOPROLOL TARTRATE 100 MG PO TABS: 100.0000 mg | ORAL_TABLET | Freq: Two times a day (BID) | ORAL | 0 refills | Status: DC

## 2019-08-26 ENCOUNTER — Other Ambulatory Visit: Payer: Self-pay | Admitting: Cardiovascular Disease

## 2019-08-27 ENCOUNTER — Ambulatory Visit: Payer: Self-pay | Attending: Internal Medicine

## 2019-08-27 DIAGNOSIS — Z23 Encounter for immunization: Secondary | ICD-10-CM

## 2019-08-27 NOTE — Progress Notes (Signed)
   Covid-19 Vaccination Clinic  Name:  Hayden Williams    MRN: [id number] DOB: 10-Apr-1956  08/27/2019  Hayden Williams was observed post Covid-19 immunization for 30 minutes based on pre-vaccination screening without incident. He was provided with Vaccine Information Sheet and instruction to access the V-Safe system.   Hayden Williams was instructed to call 911 with any severe reactions post vaccine: Marland Kitchen Difficulty breathing  . Swelling of face and throat  . A fast heartbeat  . A bad rash all over body  . Dizziness and weakness   Immunizations Administered    Name Date Dose VIS Date Route   Pfizer COVID-19 Vaccine 08/27/2019  9:52 AM 0.3 mL 07/03/2018 Intramuscular   Manufacturer: [place]   Lot: [id number]   Hayden: [id number]

## 2019-09-03 ENCOUNTER — Telehealth: Payer: Self-pay | Admitting: Cardiovascular Disease

## 2019-09-03 ENCOUNTER — Other Ambulatory Visit: Payer: Self-pay | Admitting: Cardiovascular Disease

## 2019-09-03 MED ORDER — METOPROLOL TARTRATE 100 MG PO TABS: 100.0000 mg | ORAL_TABLET | Freq: Two times a day (BID) | ORAL | 0 refills | Status: DC

## 2019-09-03 NOTE — Telephone Encounter (Signed)
*  STAT* If patient is at the pharmacy, call can be transferred to refill team.   1. Which medications need to be refilled? (please list name of each medication and dose if known) metoprolol tartrate (LOPRESSOR) 100 MG tablet  2. Which pharmacy/location (including street and city if local pharmacy) is medication to be sent to? [place], [place]  3. Do they need a 30 day or 90 day supply? 90   Patient has an appt tomorrow 09/04/19 with Dr. Angelena Form at 12:00pm

## 2019-09-03 NOTE — Telephone Encounter (Signed)
Pt's medication was sent to pt's pharmacy as requested. Confirmation received.  °

## 2019-09-03 NOTE — Telephone Encounter (Signed)
Add on for tomorrow with Dr. Angelena Form at 12:00pm

## 2019-09-04 ENCOUNTER — Ambulatory Visit: Payer: Self-pay | Admitting: Cardiovascular Disease

## 2019-09-04 NOTE — Progress Notes (Deleted)
No chief complaint on file.  History of Present Illness: 64 yo male with a history of CAD, diabetes, hypertension, hyperlipidemia, stage III chronic kidney disease and sleep apnea who is here today for cardiac follow up. He was admitted to [facility] July 2016 with unstable angina. Cardiac cath with severe distal RCA disease with a 50% proximal LAD stenosis, 60% distal LAD stenosis and 40% ramus intermediate stenosis. The RCA was treated with a drug-eluting stent. Nuclear stress test in February 2019 with no ischemia. He was admitted to [facility] February 2020 with dyspnea and black stools and was found to be anemic (Hgb 8.2). EGD on 06/27/18 with healed esophageal ulcer, esophagitis, hiatal hernia. He was transfused 1 unit pRBCs. He had acute worsening of his chronic kidney disease. He had some chest pressure when he was anemic. Troponin 0.05. He was discharged feeling much better on a PPI.   He is here today for follow up. The patient denies any chest pain, dyspnea, palpitations, lower extremity edema, orthopnea, PND, dizziness, near syncope or syncope.   Primary Care Physician: Velna Hatchet, MD  Past Medical History:  Diagnosis Date  . CAD (coronary artery disease)    a. 11/2014 Cath/PCI: LM nl, LAD 50p, 60d, RI 40, LCX small, OM1 40, OM2 40, RCA 95d (2.25x20 Promus Premier DES), EF nl.  . CKD (chronic kidney disease), stage III   . Diabetes mellitus without complication ([place])   . Esophageal ulcer   . Essential hypertension   . Hyperlipidemia     Past Surgical History:  Procedure Laterality Date  . ANKLE SURGERY Right   . CARDIAC CATHETERIZATION N/A 12/02/2014   Procedure: Left Heart Cath and Coronary Angiography;  Surgeon: Burnell Blanks, MD;  Location: [place] [facility];  Service: Cardiovascular;  Laterality: N/A;  . CARDIAC CATHETERIZATION N/A 12/02/2014   Procedure: Coronary Stent Intervention;  Surgeon: Burnell Blanks, MD;  Location: [place] [facility];  Service:  Cardiovascular;  Laterality: N/A;  . CATARACT EXTRACTION    . CORONARY ANGIOPLASTY WITH STENT PLACEMENT    . ESOPHAGOGASTRODUODENOSCOPY (EGD) WITH PROPOFOL N/A 06/27/2018   Procedure: ESOPHAGOGASTRODUODENOSCOPY (EGD) WITH PROPOFOL;  Surgeon: Ladene Artist, MD;  Location: [facility];  Service: Endoscopy;  Laterality: N/A;  . EYE SURGERY    . INTRAOCULAR PROSTHESES INSERTION      Current Outpatient Medications  Medication Sig Dispense Refill  . amLODipine (NORVASC) 10 MG tablet Take 10 mg by mouth at bedtime.   3  . atorvastatin (LIPITOR) 80 MG tablet Take 1 tablet (80 mg total) by mouth daily. Please make overdue appt with Dr. Angelena Form before anymore refills. 1st attempt 30 tablet 0  . Cholecalciferol (VITAMIN D) 2000 UNITS CAPS Take 2,000 Units by mouth daily.    . clopidogrel (PLAVIX) 75 MG tablet Take 1 tablet (75 mg total) by mouth daily. Need office visit for further refills 2nd attempt 15 tablet 0  . Continuous Blood Gluc Sensor (DEXCOM G6 SENSOR) MISC 1 patch See admin instructions. Place 1 new sensor every 10 days to obtain continuous glucose readings    . ferrous sulfate 325 (65 FE) MG tablet Take 1 tablet (325 mg total) by mouth 2 (two) times daily with a meal. 60 tablet 0  . Insulin Glargine (BASAGLAR KWIKPEN) 100 UNIT/ML SOPN Inject 44 Units into the skin daily after supper.   3  . ketoconazole (NIZORAL) 2 % cream Apply 1 application topically as needed (to right foot several times a week for irritation).     Marland Kitchen  losartan-hydrochlorothiazide (HYZAAR) 100-25 MG tablet Take 1 tablet by mouth daily.  4  . metoprolol tartrate (LOPRESSOR) 100 MG tablet Take 1 tablet (100 mg total) by mouth 2 (two) times daily. Please keep upcoming appt in April before anymore refills. Thank you 60 tablet 0  . MITIGARE 0.6 MG CAPS Take 1 capsule by mouth daily as needed (for gout flares).     . nitroGLYCERIN (NITROSTAT) 0.4 MG SL tablet PLACE 1 TABLET UNDER THE TONGUE EVERY 5 MINUTES AS NEEDED FOR CHEST  PAIN (Patient taking differently: Place 0.4 mg under the tongue every 5 (five) minutes as needed for chest pain. ) 25 tablet 2  . NOVOLOG FLEXPEN 100 UNIT/ML FlexPen Inject 2-11 Units into the skin See admin instructions. Inject 2-11 units into the skin two times a day with meals, per sliding scale    . pantoprazole (PROTONIX) 40 MG tablet Take 1 tablet (40 mg total) by mouth 2 (two) times daily before a meal. 60 tablet 0  . pioglitazone (ACTOS) 15 MG tablet Take 15 mg by mouth daily.    . Podiatric Products (GOLD BOND FOOT) CREA Apply 1 application topically daily as needed (cracked feet).     Donna Bernard 18 MG/3ML SOPN Inject 1.8 mg into the skin daily before lunch.     . vitamin C (ASCORBIC ACID) 500 MG tablet Take 1 tablet (500 mg total) by mouth 2 (two) times daily. Take with ferrous sulfate to decrease upset stomach 60 tablet 0   No current facility-administered medications for this visit.    Allergies  Allergen Reactions  . Peanut Oil Anaphylaxis and Swelling  . Peanut-Containing Drug Products Anaphylaxis and Swelling  . Lisinopril Other (See Comments) and Cough    Increased BUN and creatinine, also    . Niacin Other (See Comments)    Reaction not recalled     Social History   Socioeconomic History  . Marital status: Married    Spouse name: [place]  . Number of children: 3  . Years of education: MBA  . Highest education level: Not on file  Occupational History  . Not on file  Tobacco Use  . Smoking status: Never Smoker  . Smokeless tobacco: Never Used  Substance and Sexual Activity  . Alcohol use: No    Alcohol/week: 0.0 standard drinks  . Drug use: No  . Sexual activity: Not on file  Other Topics Concern  . Not on file  Social History Narrative   Drinks caffeien 4-5 times a month    Social Determinants of Health   Financial Resource Strain:   . Difficulty of Paying Living Expenses:   Food Insecurity:   . Worried About Charity fundraiser in the Last Year:     . Arboriculturist in the Last Year:   Transportation Needs:   . Film/video editor (Medical):   Marland Kitchen Lack of Transportation (Non-Medical):   Physical Activity:   . Days of Exercise per Week:   . Minutes of Exercise per Session:   Stress:   . Feeling of Stress :   Social Connections:   . Frequency of Communication with Friends and Family:   . Frequency of Social Gatherings with Friends and Family:   . Attends Religious Services:   . Active Member of Clubs or Organizations:   . Attends Archivist Meetings:   Marland Kitchen Marital Status:   Intimate Partner Violence:   . Fear of Current or Ex-Partner:   . Emotionally Abused:   .  Physically Abused:   . Sexually Abused:     Family History  Problem Relation Age of Onset  . Diabetes Mother   . Heart disease Mother   . Sleep apnea Mother   . Cancer Father   . Diabetes Father   . Diabetes Sister   . Cancer Brother   . Diabetes Brother     Review of Systems:  As stated in the HPI and otherwise negative.   There were no vitals taken for this visit.  Physical Examination: General: Well developed, well nourished, NAD  HEENT: OP clear, mucus membranes moist  SKIN: warm, dry. No rashes. Neuro: No focal deficits  Musculoskeletal: Muscle strength 5/5 all ext  Psychiatric: Mood and affect normal  Neck: No JVD, no carotid bruits, no thyromegaly, no lymphadenopathy.  Lungs:Clear bilaterally, no wheezes, rhonci, crackles Cardiovascular: Regular rate and rhythm. No murmurs, gallops or rubs. Abdomen:Soft. Bowel sounds present. Non-tender.  Extremities: No lower extremity edema. Pulses are 2 + in the bilateral DP/PT.  EKG:  EKG is *** ordered today. The ekg ordered today demonstrates   Recent Labs: No results found for requested labs within last 8760 hours.   Lipid Panel:  Lipid Panel     Component Value Date/Time   CHOL 98 06/26/2018 0524   TRIG 123 06/26/2018 0524   HDL 27 (L) 06/26/2018 0524   CHOLHDL 3.6 06/26/2018 0524    VLDL 25 06/26/2018 0524   LDLCALC 46 06/26/2018 0524     Wt Readings from Last 3 Encounters:  07/06/18 221 lb (100.2 kg)  06/27/18 220 lb 6.4 oz (100 kg)  06/23/17 215 lb (97.5 kg)     Other studies Reviewed: Additional studies/ records that were reviewed today include: . Review of the above records demonstrates:    Assessment and Plan:    1. Coronary artery disease without angina: No chest pain. Nuclear stress test in February 2019 with no ischemia. Continue Plavix, statin and beta blocker.   2. Essential hypertension:  BP is controlled. Continue current therapy  3. Hyperlipidemia: LDL at goal in February 2020. Repeat lipids and LFTs now. Continue statin  4. Stage III chronic kidney disease  5. Insulin-dependent diabetes mellitus: This is followed closely by his primary care provider and endocrine.  Current medicines are reviewed at length with the patient today.  The patient does not have concerns regarding medicines.  The following changes have been made:  no change  Labs/ tests ordered today include:   No orders of the defined types were placed in this encounter.   Disposition:   FU with me in 12  months  Signed, Lauree Chandler, MD 09/04/2019 6:58 AM    [facility] [place], [place], [place]  [address] Phone: [phone]; Fax: [phone]

## 2019-09-10 ENCOUNTER — Encounter: Payer: Self-pay | Admitting: Physician Assistant

## 2019-09-10 NOTE — Progress Notes (Signed)
Virtual Visit via Video Note   This visit type was conducted due to national recommendations for restrictions regarding the COVID-19 Pandemic (e.g. social distancing) in an effort to limit this patient's exposure and mitigate transmission in our community.  Due to his co-morbid illnesses, this patient is at least at moderate risk for complications without adequate follow up.  This format is felt to be most appropriate for this patient at this time.  All issues noted in this document were discussed and addressed.  A limited physical exam was performed with this format.  Please refer to the patient's chart for his consent to telehealth for [facility].   The patient was identified using 2 identifiers.  Date:  09/13/2019   ID:  Hayden Williams, DOB 1955-12-11, MRN [id number]  Patient Location: Home Provider Location: Office  PCP:  Velna Hatchet, MD  Cardiologist:  Lauree Chandler, MD  Electrophysiologist:  None   Evaluation Performed:  Follow-Up Visit  Chief Complaint:  F/u CAD  History of Present Illness:    Hayden Williams is a 64 y.o. male with CAD (unstable angina s/p DES to distal RCA in 11/2014), diabetes, hypertension, hyperlipidemia, stage III chronic kidney disease, prior GIB and sleep apnea who is seen today for virtual follow-up. To recap history, he was admitted to [facility] in 11/2014 with NSTEMI and received PCI as above. Residual disease was treated medically (diffuse small vessel disease). Nuclear stress test in February 2019 showed no ischemia, EF 63%. No prior echo. He was admitted to [facility] February 2020 with dyspnea and black stools and was found to be anemic (Hgb 8.2). EGD on 06/27/18 with healed esophageal ulcer, esophagitis, hiatal hernia. He was transfused 1 unit pRBCs. He had acute worsening of his chronic kidney disease. He had some chest pressure when he was anemic. Troponin 0.05. He was discharged feeling much better on a PPI. He has done well since then without interim  cardiac problems.   He is seen back for follow-up virtually. He reports he is doing well from cardiac standpoint without any new cardiac symptoms. 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 His insurance has changed to [facility] so he is having to transition his care to that system. He is enrolled in a gout study and reports recent EKG/labwork that he will plan to have faxed over to [place]. Last labs personally reviewed from 03/2019 (CareEverywhere) - K 3.7, Cr 1.75, trig 221, LDL 30, LFTs wnl, Tchol 78, TSH wnl, Hgb 12.7, plt 264.     Past Medical History:  Diagnosis Date  . CAD (coronary artery disease)    a. 11/2014 Cath/PCI: LM nl, LAD 50p, 60d, RI 40, LCX small, OM1 40, OM2 40, RCA 95d (2.25x20 Promus Premier DES), EF nl.  . CKD (chronic kidney disease), stage III   . Diabetes mellitus ([place])   . Esophageal ulcer   . Essential hypertension   . GI bleed 06/2018   a. melena/ABL anemia with EGD on 06/27/18 with healed esophageal ulcer, esophagitis, hiatal hernia.  . Hyperlipidemia   . Sleep apnea    Past Surgical History:  Procedure Laterality Date  . ANKLE SURGERY Right   . CARDIAC CATHETERIZATION N/A 12/02/2014   Procedure: Left Heart Cath and Coronary Angiography;  Surgeon: Burnell Blanks, MD;  Location: [place] CV  LAB;  Service: Cardiovascular;  Laterality: N/A;  . CARDIAC CATHETERIZATION N/A 12/02/2014   Procedure: Coronary Stent Intervention;  Surgeon: Burnell Blanks, MD;  Location: [place] CV LAB;  Service: Cardiovascular;  Laterality: N/A;  . CATARACT EXTRACTION    . CORONARY ANGIOPLASTY WITH STENT PLACEMENT    . ESOPHAGOGASTRODUODENOSCOPY (EGD) WITH PROPOFOL N/A 06/27/2018   Procedure: ESOPHAGOGASTRODUODENOSCOPY (EGD) WITH PROPOFOL;  Surgeon: Ladene Artist, MD;  Location: [facility]  ENDOSCOPY;  Service: Endoscopy;  Laterality: N/A;  . EYE SURGERY    . INTRAOCULAR PROSTHESES INSERTION       Current Meds  Medication Sig  . amLODipine (NORVASC) 10 MG tablet Take 10 mg by mouth at bedtime.   . Ascorbic Acid (VITAMIN C) 500 MG CAPS Take 500 mg by mouth daily.  Marland Kitchen atorvastatin (LIPITOR) 80 MG tablet Take 1 tablet (80 mg total) by mouth daily. Please make overdue appt with Dr. Angelena Form before anymore refills. 1st attempt  . Cholecalciferol (VITAMIN D) 2000 UNITS CAPS Take 2,000 Units by mouth daily.  . clopidogrel (PLAVIX) 75 MG tablet Take 1 tablet (75 mg total) by mouth daily. Need office visit for further refills 2nd attempt  . Continuous Blood Gluc Sensor (DEXCOM G6 SENSOR) MISC 1 patch See admin instructions. Place 1 new sensor every 10 days to obtain continuous glucose readings  . Dulaglutide (TRULICITY) 1.5 0000000 SOPN Inject 1.5 mg into the skin once a week.  . insulin glargine, 1 Unit Dial, (TOUJEO) 300 UNIT/ML Solostar Pen Inject 44 Units into the skin daily.  . Iron-Vitamin C 65-125 MG TABS Take 1 tablet by mouth daily.  Marland Kitchen ketoconazole (NIZORAL) 2 % cream Apply 1 application topically as needed (to right foot several times a week for irritation).   Marland Kitchen losartan-hydrochlorothiazide (HYZAAR) 100-25 MG tablet Take 1 tablet by mouth daily.  . metoprolol tartrate (LOPRESSOR) 100 MG tablet Take 1 tablet (100 mg total) by mouth 2 (two) times daily. Please keep upcoming appt in April before anymore refills. Thank you  . MITIGARE 0.6 MG CAPS Take 1 capsule by mouth daily as needed (for gout flares).   . nitroGLYCERIN (NITROSTAT) 0.4 MG SL tablet PLACE 1 TABLET UNDER THE TONGUE EVERY 5 MINUTES AS NEEDED FOR CHEST PAIN  . NOVOLOG FLEXPEN 100 UNIT/ML FlexPen Inject 2-11 Units into the skin See admin instructions. Inject 2-11 units into the skin two times a day with meals, per sliding scale  . pantoprazole (PROTONIX) 40 MG tablet Take 40 mg by mouth daily.  . Podiatric Products  (GOLD BOND FOOT) CREA Apply 1 application topically daily as needed (cracked feet).      Allergies:   Peanut oil, Peanut-containing drug products, Lisinopril, and Niacin   Social History   Tobacco Use  . Smoking status: Never Smoker  . Smokeless tobacco: Never Used  Substance Use Topics  . Alcohol use: No    Alcohol/week: 0.0 standard drinks  . Drug use: No     Family Hx: The patient's family history includes Cancer in his brother and father; Diabetes in his brother, father, mother, and sister; Heart disease in his mother; Sleep apnea in his mother.  ROS:   Please see the history of present illness.    All other systems reviewed and are negative.   Prior CV studies:   The following studies were reviewed today:  Cardiac Cath 11/2014  Post Atrio lesion, 50% stenosed.  1st Mrg lesion, 40% stenosed.  2nd Mrg lesion, 40% stenosed.  Ramus lesion, 40% stenosed.  Prox LAD to Mid LAD lesion, 50% stenosed.  Dist LAD lesion, 60% stenosed.  Dist RCA lesion, 95% stenosed. There is a 0% residual stenosis post intervention.  A drug-eluting stent was placed.  The left ventricular systolic function is normal.   1. Severe stenosis distal RCA, NSTEMI culprit lesion 2. Successful PTCA/DES x 1 distal RCA 3. Diffuse small vessel disease in the small distal LAD and small obtuse marginal branches.  4. Normal LV systolic function  Recommendations: Will continue ASA and Plavix for at least one year. Will start statin and beta blocker.   NSR 06/2017  The left ventricular ejection fraction is normal (55-65%).  Nuclear stress EF: 63%.  Blood pressure demonstrated a normal response to exercise.  No T wave inversion was noted during stress.  There was no ST segment deviation noted during stress.  The study is normal.  This is a low risk study.   Normal perfusion. LVEF 63% with normal wall motion. This is a low risk study.     Labs/Other Tests and Data Reviewed:    EKG:   An ECG dated 06/26/18 was personally reviewed today and demonstrated:  NSR 76bpm no acute STT changes, TWI III similar to prior  Recent Labs: No results found for requested labs within last 8760 hours.   Recent Lipid Panel Lab Results  Component Value Date/Time   CHOL 98 06/26/2018 05:24 AM   TRIG 123 06/26/2018 05:24 AM   HDL 27 (L) 06/26/2018 05:24 AM   CHOLHDL 3.6 06/26/2018 05:24 AM   LDLCALC 46 06/26/2018 05:24 AM    Wt Readings from Last 3 Encounters:  07/06/18 221 lb (100.2 kg)  06/27/18 220 lb 6.4 oz (100 kg)  06/23/17 215 lb (97.5 kg)     Objective:    Vital Signs:  BP 126/88   Pulse 74    VS reviewed. General - AAM in no acute distress HEENT - NCAT, EOM intact Pulm - No labored breathing, no coughing during visit, no audible wheezing, speaking in full sentences Neuro - A+Ox3, no slurred speech, answers questions appropriately Psych - Pleasant affect  ASSESSMENT & PLAN:    1. CAD - doing well clinically without new angina. Dr. Angelena Form has maintained him on long term Plavix. He is not on ASA due to GIB. Continue BB and statin. LDL was 30 in 03/2019. His triglycerides were elevated but he states that ever since getting his new job and walking so much he is now requiring less insulin and his blood sugars look better - so suspect there will be clinical improvement in the trigs as well. Lipids are otherwise followed by primary care. OK to refill metoprolol and atorvastatin today. 2. Essential HTN - home readings reviewed, in general 124-134/70s per patient report. Continue present regimen. 3. Hyperlipidemia - LDL as outlined above. Continue present regimen. If triglycerides remain high in the future may need to consider Vascepa. Patient does report financial struggles with employment/insurance issues so will hold off at this time pending lifestyle modification. 4. CKD stage III - last creatinine in 03/2019 was stable, improved from prior. He is seeing primary care soon to  get established with a new in-network nephrologist. 5. OSA - stopped using CPAP as he found after working new job that his sleep had improved. He may benefit from repeat sleep study at some point but finances are tight per patient. Since he still carries the diagnosis and weight is not dramatically changed from year of diagnosis, would continue CPAP and f/u  with primary care for ongoing discussions. This was previously managed by neurology before his insurance change. I did notify him that we have two physicians in our office that manage sleep medicine if he needs assistance. At this juncture he is not sure if his insurance will cover our team in network so will ask nurse to reach out to billing team to find out.  Time:   Today, I have spent 21 minutes with the patient with telehealth technology discussing the above problems.     Medication Adjustments/Labs and Tests Ordered: Current medicines are reviewed at length with the patient today.  Testing and concerns regarding medicines are outlined above.    Follow Up:  In Person 1 year with Dr. Angelena Form (contingent on patient's end regarding insurance - see #5)  Signed, Charlie Pitter, PA-C  09/13/2019 10:12 AM    [place]

## 2019-09-13 ENCOUNTER — Telehealth: Payer: Self-pay

## 2019-09-13 ENCOUNTER — Telehealth (INDEPENDENT_AMBULATORY_CARE_PROVIDER_SITE_OTHER): Payer: BLUE CROSS/BLUE SHIELD | Admitting: Physician Assistant

## 2019-09-13 ENCOUNTER — Encounter: Payer: Self-pay | Admitting: Physician Assistant

## 2019-09-13 ENCOUNTER — Other Ambulatory Visit: Payer: Self-pay

## 2019-09-13 VITALS — BP 126/88 | HR 74

## 2019-09-13 DIAGNOSIS — I2510 Atherosclerotic heart disease of native coronary artery without angina pectoris: Secondary | ICD-10-CM

## 2019-09-13 DIAGNOSIS — N183 Chronic kidney disease, stage 3 unspecified: Secondary | ICD-10-CM | POA: Diagnosis not present

## 2019-09-13 DIAGNOSIS — I10 Essential (primary) hypertension: Secondary | ICD-10-CM | POA: Diagnosis not present

## 2019-09-13 DIAGNOSIS — E785 Hyperlipidemia, unspecified: Secondary | ICD-10-CM

## 2019-09-13 DIAGNOSIS — G4733 Obstructive sleep apnea (adult) (pediatric): Secondary | ICD-10-CM

## 2019-09-13 MED ORDER — ATORVASTATIN CALCIUM 80 MG PO TABS: 80.0000 mg | ORAL_TABLET | Freq: Every day | ORAL | 0 refills | Status: DC

## 2019-09-13 MED ORDER — CLOPIDOGREL BISULFATE 75 MG PO TABS: 75.0000 mg | ORAL_TABLET | Freq: Every day | ORAL | 3 refills | Status: DC

## 2019-09-13 MED ORDER — METOPROLOL TARTRATE 100 MG PO TABS: 100.0000 mg | ORAL_TABLET | Freq: Two times a day (BID) | ORAL | 3 refills | Status: DC

## 2019-09-13 NOTE — Telephone Encounter (Signed)
  Patient Consent for Virtual Visit         [name] has provided verbal consent on 09/13/2019 for a virtual visit (video or telephone).   CONSENT FOR VIRTUAL VISIT FOR:  Hayden Williams  By participating in this virtual visit I agree to the following:  I hereby voluntarily request, consent and authorize [facility] and its employed or contracted physicians, physician assistants, nurse practitioners or other licensed health care professionals (the Practitioner), to provide me with telemedicine health care services (the "Services") as deemed necessary by the treating Practitioner. I acknowledge and consent to receive the Services by the Practitioner via telemedicine. I understand that the telemedicine visit will involve communicating with the Practitioner through live audiovisual communication technology and the disclosure of certain medical information by electronic transmission. I acknowledge that I have been given the opportunity to request an in-person assessment or other available alternative prior to the telemedicine visit and am voluntarily participating in the telemedicine visit.  I understand that I have the right to withhold or withdraw my consent to the use of telemedicine in the course of my care at any time, without affecting my right to future care or treatment, and that the Practitioner or I may terminate the telemedicine visit at any time. I understand that I have the right to inspect all information obtained and/or recorded in the course of the telemedicine visit and may receive copies of available information for a reasonable fee.  I understand that some of the potential risks of receiving the Services via telemedicine include:  Marland Kitchen Delay or interruption in medical evaluation due to technological equipment failure or disruption; . Information transmitted may not be sufficient (e.g. poor resolution of images) to allow for appropriate medical decision making by the Practitioner;  and/or  . In rare instances, security protocols could fail, causing a breach of personal health information.  Furthermore, I acknowledge that it is my responsibility to provide information about my medical history, conditions and care that is complete and accurate to the best of my ability. I acknowledge that Practitioner's advice, recommendations, and/or decision may be based on factors not within their control, such as incomplete or inaccurate data provided by me or distortions of diagnostic images or specimens that may result from electronic transmissions. I understand that the practice of medicine is not an exact science and that Practitioner makes no warranties or guarantees regarding treatment outcomes. I acknowledge that a copy of this consent can be made available to me via my patient portal ([facility]), or I can request a printed copy by calling the office of [place].    I understand that my insurance will be billed for this visit.   I have read or had this consent read to me. . I understand the contents of this consent, which adequately explains the benefits and risks of the Services being provided via telemedicine.  . I have been provided ample opportunity to ask questions regarding this consent and the Services and have had my questions answered to my satisfaction. . I give my informed consent for the services to be provided through the use of telemedicine in my medical care

## 2019-09-13 NOTE — Patient Instructions (Signed)
Medication Instructions:  Your physician recommends that you continue on your current medications as directed. Please refer to the Current Medication list given to you today.  *If you need a refill on your cardiac medications before your next appointment, please call your pharmacy*   Lab Work: None ordered  If you have labs (blood work) drawn today and your tests are completely normal, you will receive your results only by: Marland Kitchen MyChart Message (if you have MyChart) OR . A paper copy in the mail If you have any lab test that is abnormal or we need to change your treatment, we will call you to review the results.   Testing/Procedures: None ordered   Follow-Up: At [facility], you and your health needs are our priority.  As part of our continuing mission to provide you with exceptional heart care, we have created designated Provider Care Teams.  These Care Teams include your primary Cardiologist (physician) and Advanced Practice Providers (APPs -  Physician Assistants and Nurse Practitioners) who all work together to provide you with the care you need, when you need it.  We recommend signing up for the patient portal called "MyChart".  Sign up information is provided on this After Visit Summary.  MyChart is used to connect with patients for Virtual Visits (Telemedicine).  Patients are able to view lab/test results, encounter notes, upcoming appointments, etc.  Non-urgent messages can be sent to your provider as well.   To learn more about what you can do with MyChart, go to [email or web address].    Your next appointment:   12 month(s)  The format for your next appointment:   In Person  Provider:   Lauree Chandler, MD   Other Instructions  1. Please monitor you Blood Pressure occasionally at home. Call our office if you tend to get readings of greater than 130 on the top number or 80 on the bottom number.  2. Please fax [place] a copy of your EKG and bloodwork that you recently  had done to our office at [phone].

## 2019-10-12 ENCOUNTER — Other Ambulatory Visit: Payer: Self-pay | Admitting: Physician Assistant

## 2019-10-14 NOTE — Telephone Encounter (Signed)
Patient has never been seen at HF clinic,pt was seen in [facility] office within the last few months

## 2019-10-30 IMAGING — DX DG CHEST 2V
2 series · 2 of 2 positions shown · non-contrast
Comparison: November 22, 2014

CLINICAL DATA: Chest pain

EXAM:
CHEST - 2 VIEW

[chest pa]
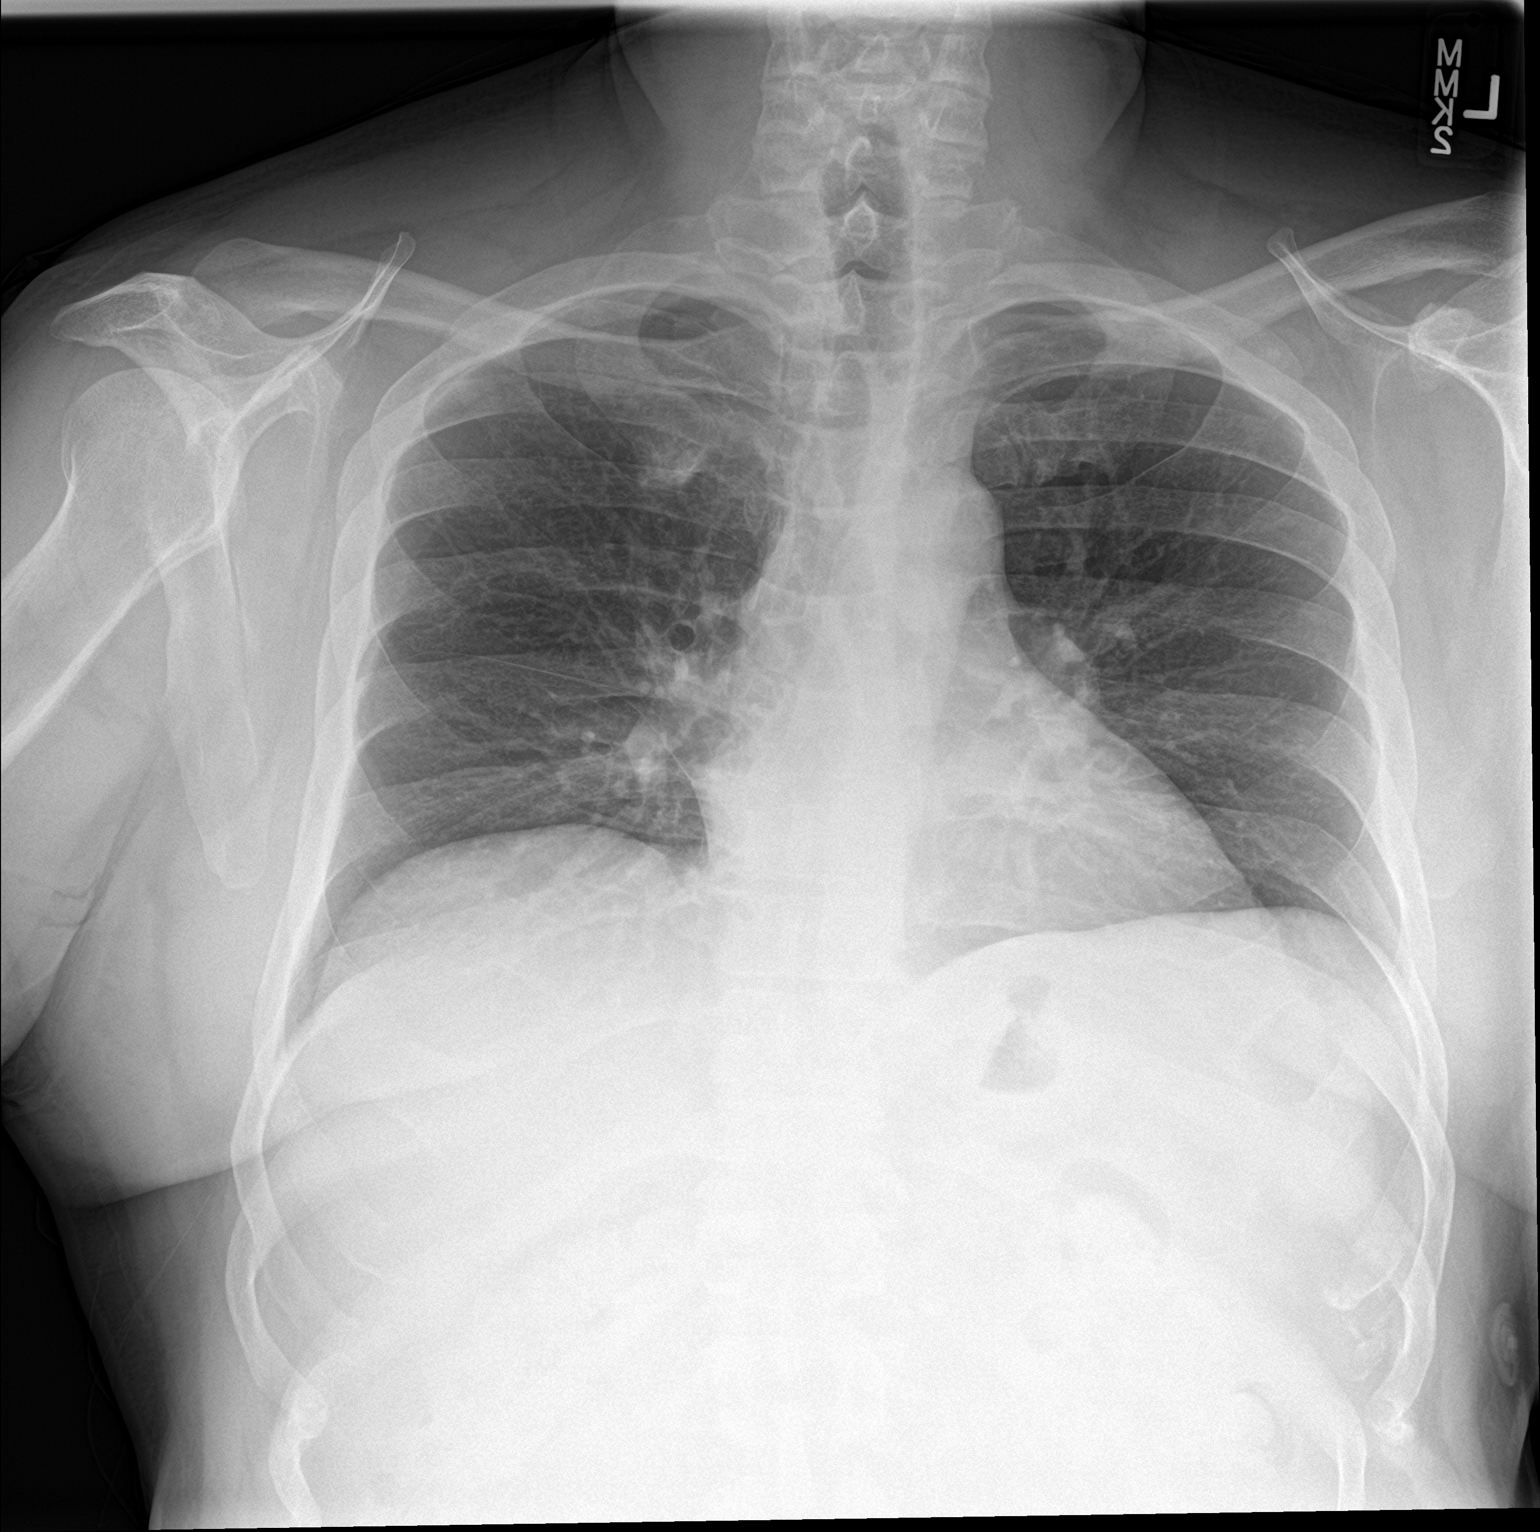

[chest lat]
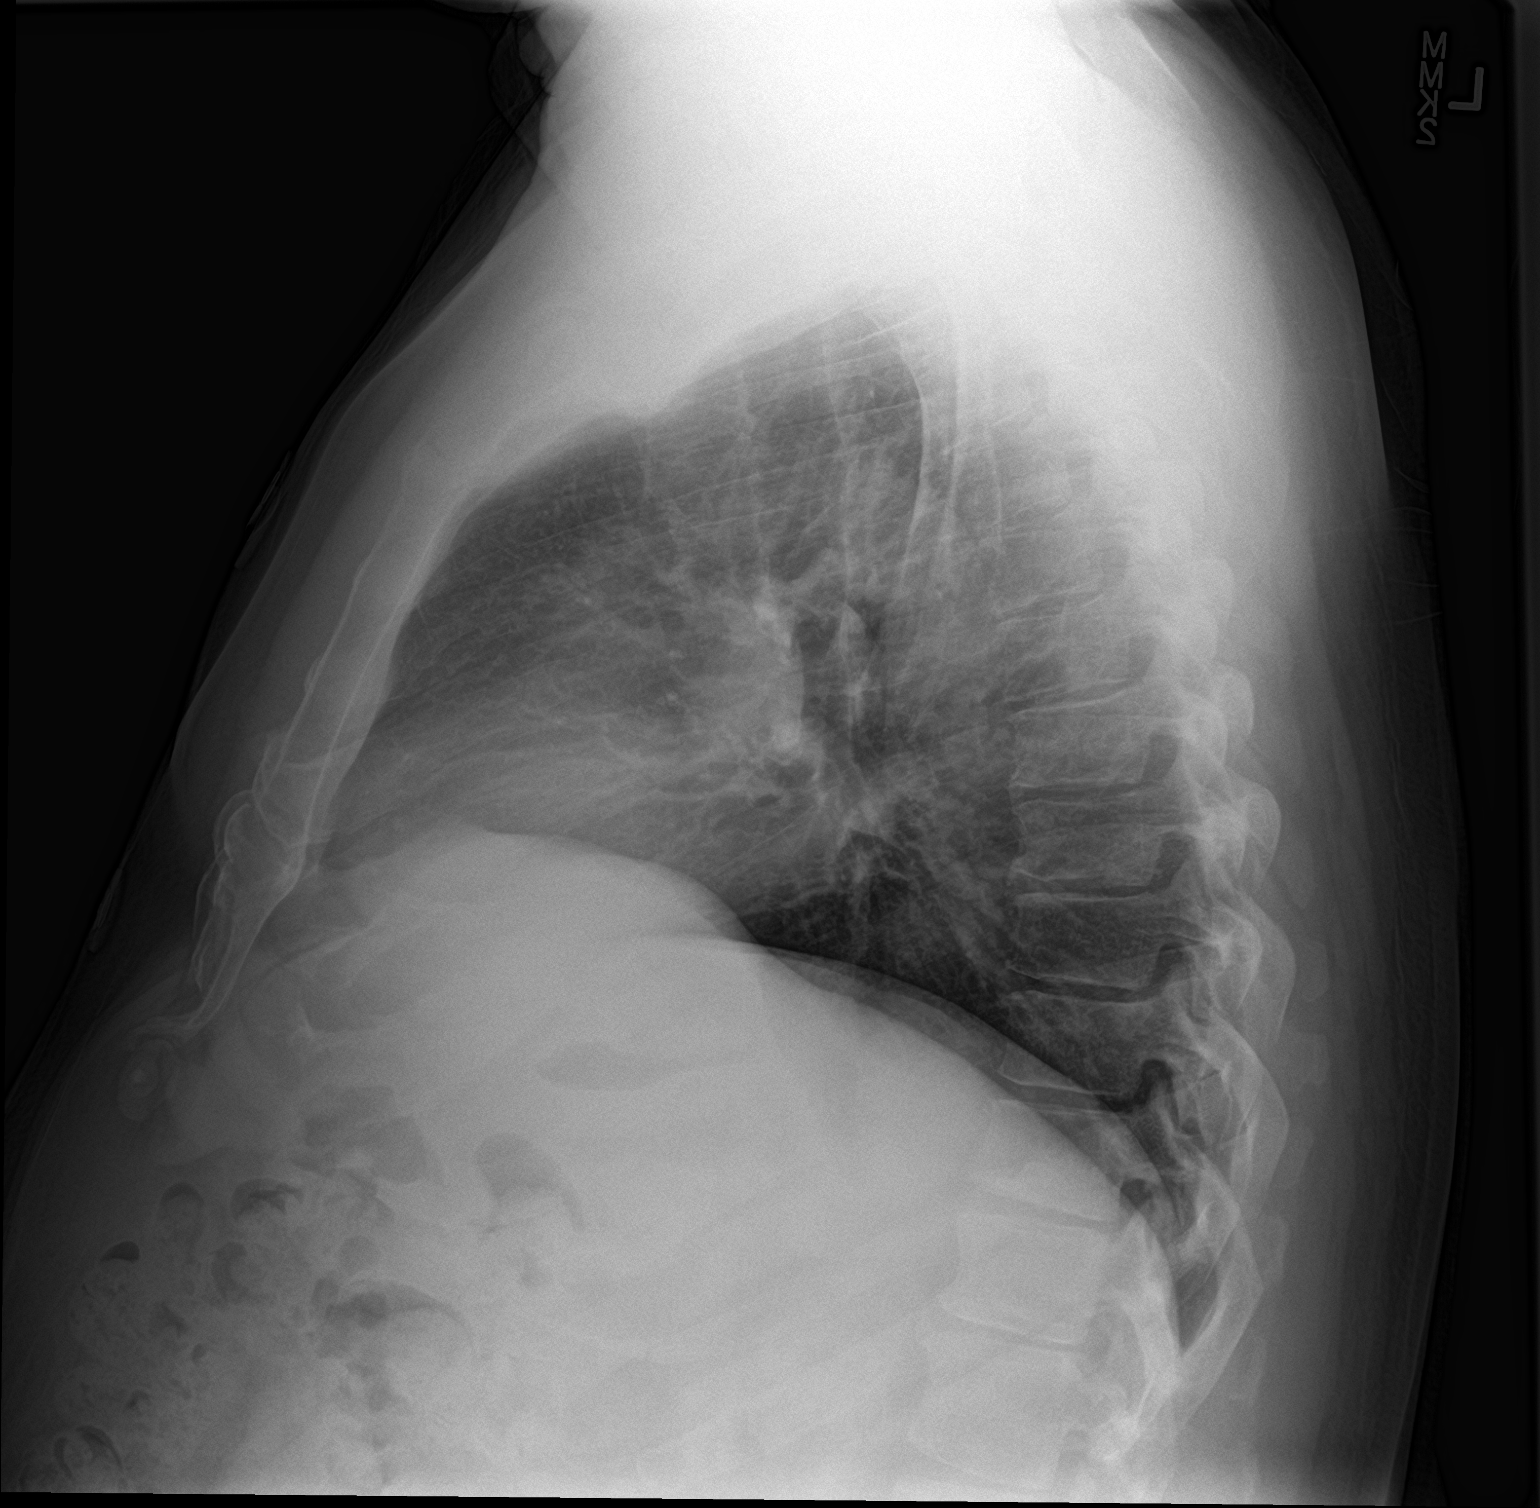

[2 of 2 positions shown; findings below may reference images not displayed]

FINDINGS: No edema or consolidation. Heart size and pulmonary vascularity are
normal. No adenopathy. No pneumothorax. No bone lesions.
IMPRESSION: No edema or consolidation.

## 2020-05-19 ENCOUNTER — Other Ambulatory Visit: Payer: Self-pay | Admitting: Physician Assistant

## 2020-06-15 ENCOUNTER — Other Ambulatory Visit: Payer: Self-pay

## 2020-06-15 MED ORDER — ATORVASTATIN CALCIUM 80 MG PO TABS: 80.0000 mg | ORAL_TABLET | Freq: Every day | ORAL | 0 refills | Status: DC

## 2020-09-18 ENCOUNTER — Other Ambulatory Visit: Payer: Self-pay | Admitting: Physician Assistant

## 2020-11-11 ENCOUNTER — Other Ambulatory Visit: Payer: Self-pay

## 2020-11-11 MED ORDER — CLOPIDOGREL BISULFATE 75 MG PO TABS: 75.0000 mg | ORAL_TABLET | Freq: Every day | ORAL | 0 refills | Status: DC

## 2020-11-18 ENCOUNTER — Other Ambulatory Visit: Payer: Self-pay

## 2020-11-18 MED ORDER — METOPROLOL TARTRATE 100 MG PO TABS: 100.0000 mg | ORAL_TABLET | Freq: Two times a day (BID) | ORAL | 0 refills | Status: DC

## 2020-11-25 ENCOUNTER — Other Ambulatory Visit: Payer: Self-pay | Admitting: Cardiovascular Disease

## 2020-11-26 ENCOUNTER — Other Ambulatory Visit: Payer: Self-pay | Admitting: Cardiovascular Disease

## 2020-12-07 ENCOUNTER — Telehealth: Payer: Self-pay | Admitting: Cardiovascular Disease

## 2020-12-07 MED ORDER — CLOPIDOGREL BISULFATE 75 MG PO TABS: 75.0000 mg | ORAL_TABLET | Freq: Every day | ORAL | 6 refills | Status: DC

## 2020-12-07 NOTE — Telephone Encounter (Signed)
Pt aware will forward to Dr Angelena Form for review .Adonis Housekeeper

## 2020-12-07 NOTE — Telephone Encounter (Signed)
*  STAT* If patient is at the pharmacy, call can be transferred to refill team.   1. Which medications need to be refilled? (please list name of each medication and dose if known)  clopidogrel (PLAVIX) 75 MG tablet  2. Which pharmacy/location (including street and city if local pharmacy) is medication to be sent to? [place] [id number] - , [address]  3. Do they need a 30 day or 90 day supply? 90 with refills  Patient is scheduled to see Dr. Angelena Form 05/18/21 at 8:20 am.   Patient is out of medication

## 2020-12-07 NOTE — Telephone Encounter (Signed)
Pt aware of recommendations and verbalizes understanding ./cy 

## 2020-12-07 NOTE — Telephone Encounter (Signed)
Patient tested positive for COVID 12/06/20. He wanted to know if Dr. Angelena Form would write him an RX for the medication that could shorten his symptoms.   Please send medication to [place] [facility] [id number] - [place], [address]

## 2020-12-07 NOTE — Telephone Encounter (Signed)
Refill done as requested ./cy

## 2020-12-08 ENCOUNTER — Other Ambulatory Visit: Payer: Self-pay | Admitting: Cardiovascular Disease

## 2021-01-01 ENCOUNTER — Other Ambulatory Visit: Payer: Self-pay | Admitting: Cardiovascular Disease

## 2021-01-01 ENCOUNTER — Telehealth: Payer: Self-pay | Admitting: Cardiovascular Disease

## 2021-01-01 NOTE — Telephone Encounter (Signed)
*  STAT* If patient is at the pharmacy, call can be transferred to refill team.   1. Which medications need to be refilled? (please list name of each medication and dose if known)  metoprolol tartrate (LOPRESSOR) 100 MG tablet  2. Which pharmacy/location (including street and city if local pharmacy) is medication to be sent to? [facility] [id number] - [place], [address]  3. Do they need a 30 day or 90 day supply?  90 day supply  Pt is currently out of this medicine Pt has an upcoming appt with [facility]

## 2021-01-07 ENCOUNTER — Other Ambulatory Visit: Payer: Self-pay | Admitting: *Deleted

## 2021-01-07 MED ORDER — ATORVASTATIN CALCIUM 80 MG PO TABS: 80.0000 mg | ORAL_TABLET | Freq: Every day | ORAL | 1 refills | Status: DC

## 2021-05-17 NOTE — Progress Notes (Signed)
Chief Complaint  Patient presents with   Follow-up    CAD   History of Present Illness:  66 yo male with a history of CAD, diabetes, hypertension, hyperlipidemia, stage III chronic kidney disease and sleep apnea who is here today for cardiac follow up. He was admitted to [facility] July 2016 with unstable angina. Cardiac cath with severe distal RCA disease with a 50% proximal LAD stenosis, 60% distal LAD stenosis and 40% ramus intermediate stenosis. The RCA was treated with a drug-eluting stent. Nuclear stress test in February 2019 with no ischemia. He was admitted to [facility] February 2020 with dyspnea and black stools and was found to be anemic (Hgb 8.2). EGD on 06/27/18 with healed esophageal ulcer, esophagitis, hiatal hernia. He was transfused 1 unit pRBCs. He had acute worsening of his chronic kidney disease. He had some chest pressure when he was anemic. Troponin 0.05. He was discharged feeling much better on a PPI.   He is here today for follow up. The patient denies any chest pain, dyspnea, palpitations, lower extremity edema, orthopnea, PND, dizziness, near syncope or syncope. He is very active. He feels great.   Primary Care Physician: Percell Belt, DO  Past Medical History:  Diagnosis Date   CAD (coronary artery disease)    a. 11/2014 Cath/PCI: LM nl, LAD 50p, 60d, RI 40, LCX small, OM1 40, OM2 40, RCA 95d (2.25x20 Promus Premier DES), EF nl.   CKD (chronic kidney disease), stage III (HCC)    Diabetes mellitus ([place])    Esophageal ulcer    Essential hypertension    GI bleed 06/2018   a. melena/ABL anemia with EGD on 06/27/18 with healed esophageal ulcer, esophagitis, hiatal hernia.   Hyperlipidemia    Sleep apnea     Past Surgical History:  Procedure Laterality Date   ANKLE SURGERY Right    CARDIAC CATHETERIZATION N/A 12/02/2014   Procedure: Left Heart Cath and Coronary Angiography;  Surgeon: Burnell Blanks, MD;  Location: [place] [facility];  Service: Cardiovascular;   Laterality: N/A;   CARDIAC CATHETERIZATION N/A 12/02/2014   Procedure: Coronary Stent Intervention;  Surgeon: Burnell Blanks, MD;  Location: [place] [facility];  Service: Cardiovascular;  Laterality: N/A;   CATARACT EXTRACTION     CORONARY ANGIOPLASTY WITH STENT PLACEMENT     ESOPHAGOGASTRODUODENOSCOPY (EGD) WITH PROPOFOL N/A 06/27/2018   Procedure: ESOPHAGOGASTRODUODENOSCOPY (EGD) WITH PROPOFOL;  Surgeon: Ladene Artist, MD;  Location: [facility] ENDOSCOPY;  Service: Endoscopy;  Laterality: N/A;   EYE SURGERY     INTRAOCULAR PROSTHESES INSERTION      Current Outpatient Medications  Medication Sig Dispense Refill   amLODipine (NORVASC) 10 MG tablet Take 10 mg by mouth at bedtime.   3   Ascorbic Acid (VITAMIN C) 500 MG CAPS Take 500 mg by mouth daily.     atorvastatin (LIPITOR) 80 MG tablet Take 1 tablet (80 mg total) by mouth daily. Please make yearly appt with Dr. Angelena Form for May 2022 for future refills. Thank you 1st attempt 90 tablet 1   Cholecalciferol (VITAMIN D) 2000 UNITS CAPS Take 2,000 Units by mouth daily.     clopidogrel (PLAVIX) 75 MG tablet Take 1 tablet (75 mg total) by mouth daily. KEEP January APPT ./CY 30 tablet 6   Continuous Blood Gluc Sensor (DEXCOM G6 SENSOR) MISC 1 patch See admin instructions. Place 1 new sensor every 10 days to obtain continuous glucose readings     Dulaglutide (TRULICITY) 1.5 OH/6.0VP SOPN Inject 1.5 mg  into the skin once a week.     insulin glargine, 1 Unit Dial, (TOUJEO) 300 UNIT/ML Solostar Pen Inject 44 Units into the skin daily.     Iron-Vitamin C 65-125 MG TABS Take 1 tablet by mouth daily.     ketoconazole (NIZORAL) 2 % cream Apply 1 application topically as needed (to right foot several times a week for irritation).      MITIGARE 0.6 MG CAPS Take 1 capsule by mouth daily as needed (for gout flares).      nitroGLYCERIN (NITROSTAT) 0.4 MG SL tablet PLACE 1 TABLET UNDER THE TONGUE EVERY 5 MINUTES AS NEEDED FOR CHEST PAIN 25 tablet 2   NOVOLOG  FLEXPEN 100 UNIT/ML FlexPen Inject 2-11 Units into the skin See admin instructions. Inject 2-11 units into the skin two times a day with meals, per sliding scale     Podiatric Products (GOLD BOND FOOT) CREA Apply 1 application topically daily as needed (cracked feet).      valsartan-hydrochlorothiazide (DIOVAN-HCT) 160-25 MG tablet Take 1 tablet by mouth daily. 90 tablet 3   metoprolol tartrate (LOPRESSOR) 100 MG tablet TAKE ONE TABLET BY MOUTH TWICE A DAY. 180 tablet 3   pantoprazole (PROTONIX) 40 MG tablet Take 1 tablet (40 mg total) by mouth daily. 90 tablet 3   No current facility-administered medications for this visit.    Allergies  Allergen Reactions   Peanut Oil Anaphylaxis and Swelling   Peanut-Containing Drug Products Anaphylaxis and Swelling   Lisinopril Other (See Comments) and Cough    Increased BUN and creatinine, also     Niacin Other (See Comments)    Reaction not recalled     Social History   Socioeconomic History   Marital status: Married    Spouse name: [place]   Number of children: 3   Years of education: MBA   Highest education level: Not on file  Occupational History   Not on file  Tobacco Use   Smoking status: Never   Smokeless tobacco: Never  Vaping Use   Vaping Use: Never used  Substance and Sexual Activity   Alcohol use: No    Alcohol/week: 0.0 standard drinks   Drug use: No   Sexual activity: Not on file  Other Topics Concern   Not on file  Social History Narrative   Drinks caffeien 4-5 times a month    Social Determinants of Health   Financial Resource Strain: Not on file  Food Insecurity: Not on file  Transportation Needs: Not on file  Physical Activity: Not on file  Stress: Not on file  Social Connections: Not on file  Intimate Partner Violence: Not on file    Family History  Problem Relation Age of Onset   Diabetes Mother    Heart disease Mother    Sleep apnea Mother    Cancer Father    Diabetes Father    Diabetes Sister     Cancer Brother    Diabetes Brother     Review of Systems:  As stated in the HPI and otherwise negative.   BP 114/70    Pulse 65    Ht 5\' 11"  (1.803 m)    Wt 216 lb (98 kg)    SpO2 98%    BMI 30.13 kg/m   Physical Examination: General: Well developed, well nourished, NAD  HEENT: OP clear, mucus membranes moist  SKIN: warm, dry. No rashes. Neuro: No focal deficits  Musculoskeletal: Muscle strength 5/5 all ext  Psychiatric: Mood and affect normal  Neck: No JVD, no carotid bruits, no thyromegaly, no lymphadenopathy.  Lungs:Clear bilaterally, no wheezes, rhonci, crackles Cardiovascular: Regular rate and rhythm. No murmurs, gallops or rubs. Abdomen:Soft. Bowel sounds present. Non-tender.  Extremities: No lower extremity edema. Pulses are 2 + in the bilateral DP/PT.  EKG:  EKG is ordered today. The ekg ordered today demonstrates sinus, RBBB  Recent Labs: No results found for requested labs within last 8760 hours.   Lipid Panel:  Lipid Panel     Component Value Date/Time   CHOL 98 06/26/2018 0524   TRIG 123 06/26/2018 0524   HDL 27 (L) 06/26/2018 0524   CHOLHDL 3.6 06/26/2018 0524   VLDL 25 06/26/2018 0524   LDLCALC 46 06/26/2018 0524     Wt Readings from Last 3 Encounters:  05/18/21 216 lb (98 kg)  07/06/18 221 lb (100.2 kg)  06/27/18 220 lb 6.4 oz (100 kg)     Other studies Reviewed: Additional studies/ records that were reviewed today include: . Review of the above records demonstrates:    Assessment and Plan:    1. Coronary artery disease without angina: No chest pain.  Nuclear stress test in February 2019 with no ischemia. Continue Plavix, statin and beta blocker  2. Essential hypertension:  BP is controlled. No changes  3. Hyperlipidemia: LDL 45 in Sept 2022 in  system. Continue statin.   4. Stage III chronic kidney disease  5. Insulin-dependent diabetes mellitus: This is followed closely by his primary care provider and endocrine.   Current  medicines are reviewed at length with the patient today.  The patient does not have concerns regarding medicines.  The following changes have been made:  no change  Labs/ tests ordered today include:   Orders Placed This Encounter  Procedures   EKG 12-Lead    Disposition:   F/U with me in 12  months  Signed, Lauree Chandler, MD 05/18/2021 9:40 AM    [facility] [place], [place],   [address] Phone: [phone]; Fax: [phone]

## 2021-05-18 ENCOUNTER — Encounter: Payer: Self-pay | Admitting: Cardiovascular Disease

## 2021-05-18 ENCOUNTER — Ambulatory Visit: Payer: 59 | Admitting: Cardiovascular Disease

## 2021-05-18 ENCOUNTER — Other Ambulatory Visit: Payer: Self-pay

## 2021-05-18 VITALS — BP 114/70 | HR 65 | Ht 71.0 in | Wt 216.0 lb

## 2021-05-18 DIAGNOSIS — I2510 Atherosclerotic heart disease of native coronary artery without angina pectoris: Secondary | ICD-10-CM | POA: Diagnosis not present

## 2021-05-18 DIAGNOSIS — I10 Essential (primary) hypertension: Secondary | ICD-10-CM | POA: Diagnosis not present

## 2021-05-18 DIAGNOSIS — E785 Hyperlipidemia, unspecified: Secondary | ICD-10-CM

## 2021-05-18 MED ORDER — PANTOPRAZOLE SODIUM 40 MG PO TBEC: 40.0000 mg | DELAYED_RELEASE_TABLET | Freq: Every day | ORAL | 3 refills | Status: AC

## 2021-05-18 MED ORDER — METOPROLOL TARTRATE 100 MG PO TABS: ORAL_TABLET | ORAL | 3 refills | Status: DC

## 2021-05-18 MED ORDER — VALSARTAN-HYDROCHLOROTHIAZIDE 160-25 MG PO TABS: 1.0000 | ORAL_TABLET | Freq: Every day | ORAL | 3 refills | Status: DC

## 2021-05-18 NOTE — Patient Instructions (Addendum)

## 2021-06-24 ENCOUNTER — Other Ambulatory Visit: Payer: Self-pay | Admitting: Cardiovascular Disease

## 2021-06-30 ENCOUNTER — Other Ambulatory Visit: Payer: Self-pay | Admitting: Cardiovascular Disease

## 2022-01-31 ENCOUNTER — Other Ambulatory Visit: Payer: Self-pay | Admitting: Cardiovascular Disease

## 2022-03-29 ENCOUNTER — Telehealth: Payer: Self-pay | Admitting: *Deleted

## 2022-03-29 ENCOUNTER — Telehealth: Payer: Self-pay

## 2022-03-29 NOTE — Telephone Encounter (Signed)
Pt has been scheduled for tele visit for pre op appt 04/05/22 @ 2:20. . Pt asked if he could set up his appt with Dr. Angelena Form as he thought he was due to see him soon in the beginning of the year. I have scheduled the pt to see Dr. Angelena Form 09/04/21 @ 8:20, pt said he is ok to be put on the wait list as well for possible sooner appt with MD.    Med rec and consent are done.

## 2022-03-29 NOTE — Telephone Encounter (Signed)
   Name: Hayden Williams  DOB: [birth year]  MRN: [id number]  Primary Cardiologist: Lauree Chandler, MD   Preoperative team, please contact this patient and set up a phone call appointment for further preoperative risk assessment. Please obtain consent and complete medication review. Thank you for your help.  I confirm that guidance regarding antiplatelet and oral anticoagulation therapy has been completed and, if necessary, noted below.  If necessary his clopidogrel may be held for 5 days prior to his procedure.  Please resume as soon as hemostasis is achieved.   Deberah Pelton, NP 03/29/2022, 11:36 AM [place]

## 2022-03-29 NOTE — Telephone Encounter (Signed)
   Pre-operative Risk Assessment    Patient Name: Hayden Williams  DOB: 1955/05/18 MRN: [id number]     Request for Surgical Clearance    Procedure:  Colonoscopy   Date of Surgery:  Clearance 04/11/22                                 Surgeon:  Dr. Katina Degree Group or Practice Name:  [facility]  Phone number:  [phone] Fax number:  [phone]   Type of Clearance Requested:   - Pharmacy:  Hold Clopidogrel (Plavix)   5-7 days prior to procedure    Type of Anesthesia:  Not Indicated   Additional requests/questions:    [name]   03/29/2022, 10:04 AM

## 2022-03-29 NOTE — Telephone Encounter (Signed)
Pt has been scheduled for tele visit for pre op appt 04/05/22 @ 2:20. . Pt asked if he could set up his appt with Dr. Angelena Form as he thought he was due to see him soon in the beginning of the year. I have scheduled the pt to see Dr. Angelena Form 09/04/21 @ 8:20, pt said he is ok to be put on the wait list as well for possible sooner appt with MD.   Med rec and consent are done.     Patient Consent for Virtual Visit        Hayden Williams has provided verbal consent on 03/29/2022 for a virtual visit (video or telephone).   CONSENT FOR VIRTUAL VISIT FOR:  Hayden Williams  By participating in this virtual visit I agree to the following:  I hereby voluntarily request, consent and authorize [facility] and its employed or contracted physicians, physician assistants, nurse practitioners or other licensed health care professionals (the Practitioner), to provide me with telemedicine health care services (the "Services") as deemed necessary by the treating Practitioner. I acknowledge and consent to receive the Services by the Practitioner via telemedicine. I understand that the telemedicine visit will involve communicating with the Practitioner through live audiovisual communication technology and the disclosure of certain medical information by electronic transmission. I acknowledge that I have been given the opportunity to request an in-person assessment or other available alternative prior to the telemedicine visit and am voluntarily participating in the telemedicine visit.  I understand that I have the right to withhold or withdraw my consent to the use of telemedicine in the course of my care at any time, without affecting my right to future care or treatment, and that the Practitioner or I may terminate the telemedicine visit at any time. I understand that I have the right to inspect all information obtained and/or recorded in the course of the telemedicine visit and may receive copies of available  information for a reasonable fee.  I understand that some of the potential risks of receiving the Services via telemedicine include:  Delay or interruption in medical evaluation due to technological equipment failure or disruption; Information transmitted may not be sufficient (e.g. poor resolution of images) to allow for appropriate medical decision making by the Practitioner; and/or  In rare instances, security protocols could fail, causing a breach of personal health information.  Furthermore, I acknowledge that it is my responsibility to provide information about my medical history, conditions and care that is complete and accurate to the best of my ability. I acknowledge that Practitioner's advice, recommendations, and/or decision may be based on factors not within their control, such as incomplete or inaccurate data provided by me or distortions of diagnostic images or specimens that may result from electronic transmissions. I understand that the practice of medicine is not an exact science and that Practitioner makes no warranties or guarantees regarding treatment outcomes. I acknowledge that a copy of this consent can be made available to me via my patient portal ([facility]), or I can request a printed copy by calling the office of [facility].    I understand that my insurance will be billed for this visit.   I have read or had this consent read to me. I understand the contents of this consent, which adequately explains the benefits and risks of the Services being provided via telemedicine.  I have been provided ample opportunity to ask questions regarding this consent and the Services and have had  my questions answered to my satisfaction. I give my informed consent for the services to be provided through the use of telemedicine in my medical care

## 2022-04-05 ENCOUNTER — Ambulatory Visit: Payer: Medicare HMO | Attending: Cardiology | Admitting: Physician Assistant

## 2022-04-05 DIAGNOSIS — Z01810 Encounter for preprocedural cardiovascular examination: Secondary | ICD-10-CM

## 2022-04-05 NOTE — Progress Notes (Signed)
Virtual Visit via Telephone Note   Because of Hayden Williams's co-morbid illnesses, he is at least at moderate risk for complications without adequate follow up.  This format is felt to be most appropriate for this patient at this time.  The patient did not have access to video technology/had technical difficulties with video requiring transitioning to audio format only (telephone).  All issues noted in this document were discussed and addressed.  No physical exam could be performed with this format.  Please refer to the patient's chart for his consent to telehealth for [facility].  Evaluation Performed:  Preoperative cardiovascular risk assessment _____________   Date:  04/05/2022   Patient ID:  [name], DOB [birth year], MRN [id number] Patient Location:  Home Provider location:   Office  Primary Care Provider:  Percell Belt, DO Primary Cardiologist:  Lauree Chandler, MD  Chief Complaint / Patient Profile   66 y.o. y/o male with a h/o CAD s/p DES-RCA 22025 with nonischemic nuclear stress test 2019, CKD, HTN, DM, hx of GI bleed who is pending colonoscopy and presents today for telephonic preoperative cardiovascular risk assessment.  Past Medical History    Past Medical History:  Diagnosis Date   CAD (coronary artery disease)    a. 11/2014 Cath/PCI: LM nl, LAD 50p, 60d, RI 40, LCX small, OM1 40, OM2 40, RCA 95d (2.25x20 Promus Premier DES), EF nl.   CKD (chronic kidney disease), stage III (HCC)    Diabetes mellitus ([place])    Esophageal ulcer    Essential hypertension    GI bleed 06/2018   a. melena/ABL anemia with EGD on 06/27/18 with healed esophageal ulcer, esophagitis, hiatal hernia.   Hyperlipidemia    Sleep apnea    Past Surgical History:  Procedure Laterality Date   ANKLE SURGERY Right    CARDIAC CATHETERIZATION N/A 12/02/2014   Procedure: Left Heart Cath and Coronary Angiography;  Surgeon: Burnell Blanks, MD;  Location: [place] [facility];   Service: Cardiovascular;  Laterality: N/A;   CARDIAC CATHETERIZATION N/A 12/02/2014   Procedure: Coronary Stent Intervention;  Surgeon: Burnell Blanks, MD;  Location: [place] [facility];  Service: Cardiovascular;  Laterality: N/A;   CATARACT EXTRACTION     CORONARY ANGIOPLASTY WITH STENT PLACEMENT     ESOPHAGOGASTRODUODENOSCOPY (EGD) WITH PROPOFOL N/A 06/27/2018   Procedure: ESOPHAGOGASTRODUODENOSCOPY (EGD) WITH PROPOFOL;  Surgeon: Ladene Artist, MD;  Location: [facility] ENDOSCOPY;  Service: Endoscopy;  Laterality: N/A;   EYE SURGERY     INTRAOCULAR PROSTHESES INSERTION      Allergies  Allergies  Allergen Reactions   Peanut Oil Anaphylaxis and Swelling   Peanut-Containing Drug Products Anaphylaxis and Swelling   Lisinopril Other (See Comments) and Cough    Increased BUN and creatinine, also     Niacin Other (See Comments)    Reaction not recalled     History of Present Illness    [name] is a 66 y.o. male who presents via audio/video conferencing for a telehealth visit today.  Pt was last seen in cardiology clinic on 05/18/21 by Dr. Angelena Form.  At that time ZEKE AKER was doing well.  The patient is now pending procedure as outlined above. Since his last visit, he remains active. He walks dogs daily and can climb stairs daily without angina.   Home Medications    Prior to Admission medications   Medication Sig Start Date End Date Taking? Authorizing Provider  allopurinol (ZYLOPRIM) 100 MG tablet Take 100  mg by mouth 2 (two) times daily. 09/03/21   [provider]  amLODipine (NORVASC) 10 MG tablet Take 10 mg by mouth at bedtime.  02/23/15   [provider]  Ascorbic Acid (VITAMIN C) 500 MG CAPS Take 500 mg by mouth daily.    [provider]  atorvastatin (LIPITOR) 80 MG tablet TAKE ONE TABLET BY MOUTH DAILY 01/31/22   Burnell Blanks, MD  Cholecalciferol (VITAMIN D) 2000 UNITS CAPS Take 2,000 Units by mouth daily.    [provider]  clopidogrel (PLAVIX) 75 MG tablet Take 1 tablet (75 mg total) by mouth daily. 06/25/21   Burnell Blanks, MD  Continuous Blood Gluc Sensor (DEXCOM G6 SENSOR) MISC 1 patch See admin instructions. Place 1 new sensor every 10 days to obtain continuous glucose readings 02/01/18   [provider]  Dulaglutide (TRULICITY) 1.5 KW/4.0XB SOPN Inject 1.5 mg into the skin once a week. 01/30/19   [provider]  insulin glargine, 1 Unit Dial, (TOUJEO) 300 UNIT/ML Solostar Pen Inject 44 Units into the skin daily. 01/11/19   [provider]  Iron-Vitamin C 65-125 MG TABS Take 1 tablet by mouth daily.    [provider]  ketoconazole (NIZORAL) 2 % cream Apply 1 application topically as needed (to right foot several times a week for irritation).     [provider]  metoprolol tartrate (LOPRESSOR) 100 MG tablet TAKE ONE TABLET BY MOUTH TWICE A DAY. 05/18/21   Burnell Blanks, MD  MITIGARE 0.6 MG CAPS Take 1 capsule by mouth daily as needed (for gout flares).  Patient not taking: Reported on 03/29/2022 12/09/14   [provider]  nitroGLYCERIN (NITROSTAT) 0.4 MG SL tablet PLACE 1 TABLET UNDER THE TONGUE EVERY 5 MINUTES AS NEEDED FOR CHEST PAIN Patient not taking: Reported on 03/29/2022 04/11/16   Theora Gianotti, NP  NOVOLOG FLEXPEN 100 UNIT/ML FlexPen Inject 2-11 Units into the skin See admin instructions. Inject 2-11 units into the skin two times a day with meals, per sliding scale 06/26/16   [provider]  pantoprazole (PROTONIX) 40 MG tablet Take 1 tablet (40 mg total) by mouth daily. 05/18/21   Burnell Blanks, MD  Podiatric Products (GOLD BOND FOOT) CREA Apply 1 application topically daily as needed (cracked feet).     [provider]  valsartan-hydrochlorothiazide (DIOVAN-HCT) 160-25 MG tablet Take 1 tablet by mouth daily. 05/18/21   Burnell Blanks, MD    Physical Exam    Vital Signs:   Everette Rank does not have vital signs available for review today.  Given telephonic nature of communication, physical exam is limited. AAOx3. NAD. Normal affect.  Speech and respirations are unlabored.  Accessory Clinical Findings    None  Assessment & Plan    1.  Preoperative Cardiovascular Risk Assessment:  Pt has a history of CAD with prior stenting. He had a nonischemic nuclear stress test in 2019.  As described above, he is able to complete more than 4.0 METS without angina.  According to the RCRI, he has a 0.9% risk of Mace during the perioperative period.  He understands this and wishes to proceed.  Therefore, based on ACC/AHA guidelines, the patient would be at acceptable risk for the planned procedure without further cardiovascular testing.   The patient is on Plavix monotherapy.  Given his remaining nonobstructive disease, we recommend holding Plavix 5 to 7 days.  While holding Plavix, we recommend taking aspirin 81 mg throughout the  perioperative period.  He may switch back to Plavix monotherapy after procedure.  The patient was advised that if he develops new symptoms prior to surgery to contact our office to arrange for a follow-up visit, and he verbalized understanding.  A copy of this note will be routed to requesting surgeon.  Time:   Today, I have spent 10 minutes with the patient with telehealth technology discussing medical history, symptoms, and management plan.     Tami Lin Jahmel Flannagan, PA  04/05/2022, 2:27 PM

## 2022-04-22 ENCOUNTER — Other Ambulatory Visit: Payer: Self-pay | Admitting: Cardiovascular Disease

## 2022-05-17 NOTE — Progress Notes (Deleted)
No chief complaint on file.  History of Present Illness:  67 yo male with a history of CAD, diabetes, hypertension, hyperlipidemia, stage III chronic kidney disease and sleep apnea who is here today for cardiac follow up. He was admitted to [facility] July 2016 with unstable angina. Cardiac cath with severe distal RCA disease with a 50% proximal LAD stenosis, 60% distal LAD stenosis and 40% ramus intermediate stenosis. The RCA was treated with a drug-eluting stent. Nuclear stress test in February 2019 with no ischemia. He was admitted to [facility] February 2020 with dyspnea and black stools and was found to be anemic (Hgb 8.2). EGD on 06/27/18 with healed esophageal ulcer, esophagitis, hiatal hernia. He was transfused 1 unit pRBCs. He had acute worsening of his chronic kidney disease. He had some chest pressure when he was anemic. Troponin 0.05. He was discharged feeling much better on a PPI.   He is here today for follow up. The patient denies any chest pain, dyspnea, palpitations, lower extremity edema, orthopnea, PND, dizziness, near syncope or syncope.     Primary Care Physician: Percell Belt, DO  Past Medical History:  Diagnosis Date   CAD (coronary artery disease)    a. 11/2014 Cath/PCI: LM nl, LAD 50p, 60d, RI 40, LCX small, OM1 40, OM2 40, RCA 95d (2.25x20 Promus Premier DES), EF nl.   CKD (chronic kidney disease), stage III (HCC)    Diabetes mellitus ([place])    Esophageal ulcer    Essential hypertension    GI bleed 06/2018   a. melena/ABL anemia with EGD on 06/27/18 with healed esophageal ulcer, esophagitis, hiatal hernia.   Hyperlipidemia    Sleep apnea     Past Surgical History:  Procedure Laterality Date   ANKLE SURGERY Right    CARDIAC CATHETERIZATION N/A 12/02/2014   Procedure: Left Heart Cath and Coronary Angiography;  Surgeon: Burnell Blanks, MD;  Location: [place] [facility];  Service: Cardiovascular;  Laterality: N/A;   CARDIAC CATHETERIZATION N/A 12/02/2014    Procedure: Coronary Stent Intervention;  Surgeon: Burnell Blanks, MD;  Location: [place] [facility];  Service: Cardiovascular;  Laterality: N/A;   CATARACT EXTRACTION     CORONARY ANGIOPLASTY WITH STENT PLACEMENT     ESOPHAGOGASTRODUODENOSCOPY (EGD) WITH PROPOFOL N/A 06/27/2018   Procedure: ESOPHAGOGASTRODUODENOSCOPY (EGD) WITH PROPOFOL;  Surgeon: Ladene Artist, MD;  Location: [facility];  Service: Endoscopy;  Laterality: N/A;   EYE SURGERY     INTRAOCULAR PROSTHESES INSERTION      Current Outpatient Medications  Medication Sig Dispense Refill   allopurinol (ZYLOPRIM) 100 MG tablet Take 100 mg by mouth 2 (two) times daily.     amLODipine (NORVASC) 10 MG tablet Take 10 mg by mouth at bedtime.   3   Ascorbic Acid (VITAMIN C) 500 MG CAPS Take 500 mg by mouth daily.     atorvastatin (LIPITOR) 80 MG tablet TAKE ONE TABLET BY MOUTH DAILY 90 tablet 1   Cholecalciferol (VITAMIN D) 2000 UNITS CAPS Take 2,000 Units by mouth daily.     clopidogrel (PLAVIX) 75 MG tablet Take 1 tablet (75 mg total) by mouth daily. 90 tablet 3   Continuous Blood Gluc Sensor (DEXCOM G6 SENSOR) MISC 1 patch See admin instructions. Place 1 new sensor every 10 days to obtain continuous glucose readings     Dulaglutide (TRULICITY) 1.5 0000000 SOPN Inject 1.5 mg into the skin once a week.     insulin glargine, 1 Unit Dial, (TOUJEO) 300 UNIT/ML Solostar Pen  Inject 44 Units into the skin daily.     Iron-Vitamin C 65-125 MG TABS Take 1 tablet by mouth daily.     ketoconazole (NIZORAL) 2 % cream Apply 1 application topically as needed (to right foot several times a week for irritation).      metoprolol tartrate (LOPRESSOR) 100 MG tablet TAKE ONE TABLET BY MOUTH TWICE A DAY 180 tablet 0   MITIGARE 0.6 MG CAPS Take 1 capsule by mouth daily as needed (for gout flares).  (Patient not taking: Reported on 03/29/2022)     nitroGLYCERIN (NITROSTAT) 0.4 MG SL tablet PLACE 1 TABLET UNDER THE TONGUE EVERY 5 MINUTES AS NEEDED FOR  CHEST PAIN (Patient not taking: Reported on 03/29/2022) 25 tablet 2   NOVOLOG FLEXPEN 100 UNIT/ML FlexPen Inject 2-11 Units into the skin See admin instructions. Inject 2-11 units into the skin two times a day with meals, per sliding scale     pantoprazole (PROTONIX) 40 MG tablet Take 1 tablet (40 mg total) by mouth daily. 90 tablet 3   Podiatric Products (GOLD BOND FOOT) CREA Apply 1 application topically daily as needed (cracked feet).      valsartan-hydrochlorothiazide (DIOVAN-HCT) 160-25 MG tablet Take 1 tablet by mouth daily. 90 tablet 3   No current facility-administered medications for this visit.    Allergies  Allergen Reactions   Peanut Oil Anaphylaxis and Swelling   Peanut-Containing Drug Products Anaphylaxis and Swelling   Lisinopril Other (See Comments) and Cough    Increased BUN and creatinine, also     Niacin Other (See Comments)    Reaction not recalled     Social History   Socioeconomic History   Marital status: Married    Spouse name: [place]   Number of children: 3   Years of education: MBA   Highest education level: Not on file  Occupational History   Not on file  Tobacco Use   Smoking status: Never   Smokeless tobacco: Never  Vaping Use   Vaping Use: Never used  Substance and Sexual Activity   Alcohol use: No    Alcohol/week: 0.0 standard drinks of alcohol   Drug use: No   Sexual activity: Not on file  Other Topics Concern   Not on file  Social History Narrative   Drinks caffeien 4-5 times a month    Social Determinants of Health   Financial Resource Strain: Not on file  Food Insecurity: Not on file  Transportation Needs: Not on file  Physical Activity: Not on file  Stress: Not on file  Social Connections: Not on file  Intimate Partner Violence: Not on file    Family History  Problem Relation Age of Onset   Diabetes Mother    Heart disease Mother    Sleep apnea Mother    Cancer Father    Diabetes Father    Diabetes Sister     Cancer Brother    Diabetes Brother     Review of Systems:  As stated in the HPI and otherwise negative.   There were no vitals taken for this visit.  Physical Examination: General: Well developed, well nourished, NAD  HEENT: OP clear, mucus membranes moist  SKIN: warm, dry. No rashes. Neuro: No focal deficits  Musculoskeletal: Muscle strength 5/5 all ext  Psychiatric: Mood and affect normal  Neck: No JVD, no carotid bruits, no thyromegaly, no lymphadenopathy.  Lungs:Clear bilaterally, no wheezes, rhonci, crackles Cardiovascular: Regular rate and rhythm. No murmurs, gallops or rubs. Abdomen:Soft. Bowel sounds present. Non-tender.  Extremities: No lower extremity edema. Pulses are 2 + in the bilateral DP/PT.  EKG:  EKG is *** ordered today. The ekg ordered today demonstrates   Recent Labs: No results found for requested labs within last 365 days.   Lipid Panel:  Lipid Panel     Component Value Date/Time   CHOL 98 06/26/2018 0524   TRIG 123 06/26/2018 0524   HDL 27 (L) 06/26/2018 0524   CHOLHDL 3.6 06/26/2018 0524   VLDL 25 06/26/2018 0524   LDLCALC 46 06/26/2018 0524     Wt Readings from Last 3 Encounters:  05/18/21 98 kg  07/06/18 100.2 kg  06/27/18 100 kg    Assessment and Plan:   1. Coronary artery disease without angina: He has no chest pain. Will continue Plavix, beta blocker and statin.   2. Essential hypertension:  BP is well controlled. No changes  3. Hyperlipidemia: LDL ***. Continue statin.   4. Stage III chronic kidney disease  5. Insulin-dependent diabetes mellitus: This is followed closely by his primary care provider and endocrine.  Labs/ tests ordered today include:   No orders of the defined types were placed in this encounter.   Disposition:   F/U with me in 12  months  Signed, Lauree Chandler, MD 05/17/2022 7:59 PM    [facility] [address], [place], [place]  [address] Phone: [phone]; Fax: [phone]

## 2022-05-18 ENCOUNTER — Ambulatory Visit: Payer: Medicare HMO | Attending: Cardiovascular Disease | Admitting: Cardiovascular Disease

## 2022-06-17 ENCOUNTER — Other Ambulatory Visit: Payer: Self-pay | Admitting: Cardiovascular Disease

## 2022-07-19 ENCOUNTER — Other Ambulatory Visit: Payer: Self-pay | Admitting: Cardiovascular Disease

## 2022-07-31 ENCOUNTER — Other Ambulatory Visit: Payer: Self-pay | Admitting: Cardiovascular Disease

## 2022-08-14 ENCOUNTER — Other Ambulatory Visit: Payer: Self-pay | Admitting: Cardiovascular Disease

## 2022-08-27 ENCOUNTER — Other Ambulatory Visit: Payer: Self-pay | Admitting: Cardiovascular Disease

## 2022-08-28 ENCOUNTER — Other Ambulatory Visit: Payer: Self-pay | Admitting: Cardiovascular Disease

## 2022-08-30 ENCOUNTER — Other Ambulatory Visit: Payer: Self-pay

## 2022-09-05 ENCOUNTER — Ambulatory Visit: Payer: Self-pay | Admitting: Cardiovascular Disease

## 2023-03-30 ENCOUNTER — Other Ambulatory Visit: Payer: Self-pay | Admitting: Cardiovascular Disease

## 2023-06-01 ENCOUNTER — Other Ambulatory Visit: Payer: Self-pay | Admitting: Cardiovascular Disease

## 2023-06-05 ENCOUNTER — Other Ambulatory Visit: Payer: Self-pay | Admitting: Cardiovascular Disease

## 2023-06-09 ENCOUNTER — Other Ambulatory Visit: Payer: Self-pay | Admitting: Cardiovascular Disease

## 2023-07-16 ENCOUNTER — Other Ambulatory Visit: Payer: Self-pay | Admitting: Cardiovascular Disease

## 2023-07-17 ENCOUNTER — Telehealth: Payer: Self-pay | Admitting: Cardiovascular Disease

## 2023-07-17 MED ORDER — CLOPIDOGREL BISULFATE 75 MG PO TABS: 75.0000 mg | ORAL_TABLET | Freq: Every day | ORAL | 0 refills | Status: DC

## 2023-07-17 NOTE — Progress Notes (Unsigned)
 Cardiology Office Note:    Date:  07/18/2023   ID:  Tivis Ringer, DOB 06/09/1955, MRN [id number]  PCP:  Mattie Marlin, DO   [facility] Providers Cardiologist:  Verne Carrow, MD Cardiology APP:  Marcelino Duster, PA     Referring MD: Mattie Marlin, DO   Chief Complaint  Patient presents with   Follow-up    CAD    History of Present Illness:    Hayden Williams is a 68 y.o. male with a hx of CAD s/p DES-RCA 46962 with nonischemic nuclear stress test 2019, CKD III, HTN, DM, OSA hx of GI bleed.  He was hospitalized 11/2014 with [place] with LHC showing severe distal RCA disease treated with DES. He also had 50% proximal LAD stenosis, 60% distal LAD stenosis, and 40% RI stenosis.  Nuclear stress test February 2019 showed no ischemia.  He was admitted February 2020 with dyspnea and dark stools found to be anemic with a hemoglobin 8.2.  EGD on 06/27/2018 with healed esophageal ulcer, esophagitis, and hiatal hernia.  He received 1 unit PRBCs.  He did report chest pressure when he was anemic.  He was discharged on PPI.  He was last seen by Dr. Clifton James 05/18/2021 and was doing well at that time.  He presents today for cardiology follow-up and medication refills.  He denies cardiac complaints. He lives on second floor and parks in basement, so doing 2 flights of stairs regularly. He also walks his 2 dogs after his wife passed 2 years ago. He also walks 8-10 k steps per day. He is retired, but works part time Development worker, international aid for [employer].    Past Medical History:  Diagnosis Date   CAD (coronary artery disease)    a. 11/2014 Cath/PCI: LM nl, LAD 50p, 60d, RI 40, LCX small, OM1 40, OM2 40, RCA 95d (2.25x20 Promus Premier DES), EF nl.   CKD (chronic kidney disease), stage III (HCC)    Diabetes mellitus (HCC)    Esophageal ulcer    Essential hypertension    GI bleed 06/2018   a. melena/ABL anemia with EGD on 06/27/18 with healed esophageal ulcer, esophagitis, hiatal hernia.    Hyperlipidemia    Sleep apnea     Past Surgical History:  Procedure Laterality Date   ANKLE SURGERY Right    CARDIAC CATHETERIZATION N/A 12/02/2014   Procedure: Left Heart Cath and Coronary Angiography;  Surgeon: Kathleene Hazel, MD;  Location: [place] INVASIVE CV LAB;  Service: Cardiovascular;  Laterality: N/A;   CARDIAC CATHETERIZATION N/A 12/02/2014   Procedure: Coronary Stent Intervention;  Surgeon: Kathleene Hazel, MD;  Location: [facility] INVASIVE CV LAB;  Service: Cardiovascular;  Laterality: N/A;   CATARACT EXTRACTION     CORONARY ANGIOPLASTY WITH STENT PLACEMENT     ESOPHAGOGASTRODUODENOSCOPY (EGD) WITH PROPOFOL N/A 06/27/2018   Procedure: ESOPHAGOGASTRODUODENOSCOPY (EGD) WITH PROPOFOL;  Surgeon: Meryl Dare, MD;  Location: [facility] ENDOSCOPY;  Service: Endoscopy;  Laterality: N/A;   EYE SURGERY     INTRAOCULAR PROSTHESES INSERTION      Current Medications: Current Meds  Medication Sig   allopurinol (ZYLOPRIM) 100 MG tablet Take 100 mg by mouth 2 (two) times daily.   amLODipine (NORVASC) 10 MG tablet Take 10 mg by mouth at bedtime.    Ascorbic Acid (VITAMIN C) 500 MG CAPS Take 500 mg by mouth daily.   atorvastatin (LIPITOR) 80 MG tablet TAKE 1 TABLET BY MOUTH DAILY   Cholecalciferol (VITAMIN D) 2000 UNITS CAPS Take 2,000  Units by mouth daily.   Continuous Blood Gluc Sensor (DEXCOM G6 SENSOR) MISC 1 patch See admin instructions. Place 1 new sensor every 10 days to obtain continuous glucose readings   Dulaglutide (TRULICITY) 1.5 MG/0.5ML SOPN Inject 1.5 mg into the skin once a week.   insulin glargine, 1 Unit Dial, (TOUJEO) 300 UNIT/ML Solostar Pen Inject 44 Units into the skin daily.   Iron-Vitamin C 65-125 MG TABS Take 1 tablet by mouth daily.   ketoconazole (NIZORAL) 2 % cream Apply 1 application topically as needed (to right foot several times a week for irritation).    metoprolol tartrate (LOPRESSOR) 100 MG tablet TAKE 1 TABLET BY MOUTH 2 TIMES A DAY   MITIGARE 0.6 MG CAPS  Take 1 capsule by mouth daily as needed (for gout flares).   NOVOLOG FLEXPEN 100 UNIT/ML FlexPen Inject 2-11 Units into the skin See admin instructions. Inject 2-11 units into the skin two times a day with meals, per sliding scale   pantoprazole (PROTONIX) 40 MG tablet Take 1 tablet (40 mg total) by mouth daily.   Podiatric Products (GOLD BOND FOOT) CREA Apply 1 application topically daily as needed (cracked feet).    valsartan-hydrochlorothiazide (DIOVAN-HCT) 160-25 MG tablet Take 1 tablet by mouth daily.   [DISCONTINUED] clopidogrel (PLAVIX) 75 MG tablet Take 1 tablet (75 mg total) by mouth daily.   [DISCONTINUED] nitroGLYCERIN (NITROSTAT) 0.4 MG SL tablet PLACE 1 TABLET UNDER THE TONGUE EVERY 5 MINUTES AS NEEDED FOR CHEST PAIN     Allergies:   Peanut oil, Peanut-containing drug products, Lisinopril, and Niacin   Social History   Socioeconomic History   Marital status: Married    Spouse name: Hayden Williams   Number of children: 3   Years of education: MBA   Highest education level: Not on file  Occupational History   Not on file  Tobacco Use   Smoking status: Never   Smokeless tobacco: Never  Vaping Use   Vaping status: Never Used  Substance and Sexual Activity   Alcohol use: No    Alcohol/week: 0.0 standard drinks of alcohol   Drug use: No   Sexual activity: Not on file  Other Topics Concern   Not on file  Social History Narrative   Drinks caffeien 4-5 times a month    Social Drivers of Corporate investment banker Strain: Not on file  Food Insecurity: Low Risk  (05/24/2023)   Received from [facility]   Hunger Vital Sign    Worried About Running Out of Food in the Last Year: Never true    Ran Out of Food in the Last Year: Never true  Transportation Needs: No Transportation Needs (05/24/2023)   Received from [facility]    In the past 12 months, has lack of reliable transportation kept you from medical appointments, meetings, work or from getting things  needed for daily living? : No  Physical Activity: Not on file  Stress: Not on file  Social Connections: Unknown (09/18/2021)   Received from [facility]   Social Network    Social Network: Not on file     Family History: The patient's family history includes Cancer in his brother and father; Diabetes in his brother, father, mother, and sister; Heart disease in his mother; Sleep apnea in his mother.  ROS:   Please see the history of present illness.     All other systems reviewed and are negative.  EKGs/Labs/Other Studies Reviewed:    The  following studies were reviewed today:  Cardiac Studies & Procedures   ______________________________________________________________________________________________ CARDIAC CATHETERIZATION  CARDIAC CATHETERIZATION 12/02/2014  Narrative  Post Atrio lesion, 50% stenosed.  1st Mrg lesion, 40% stenosed.  2nd Mrg lesion, 40% stenosed.  Ramus lesion, 40% stenosed.  Prox LAD to Mid LAD lesion, 50% stenosed.  Dist LAD lesion, 60% stenosed.  Dist RCA lesion, 95% stenosed. There is a 0% residual stenosis post intervention.  A drug-eluting stent was placed.  The left ventricular systolic function is normal.  1. Severe stenosis distal RCA, NSTEMI culprit lesion 2. Successful PTCA/DES x 1 distal RCA 3. Diffuse small vessel disease in the small distal LAD and small obtuse marginal branches. 4. Normal LV systolic function  Recommendations: Will continue ASA and Plavix for at least one year. Will start statin and beta blocker.  Findings Coronary Findings Diagnostic  Dominance: Right  Left Anterior Descending  Ramus Intermedius  Left Circumflex The vessel is small .  First Obtuse Marginal Branch The vessel is small in size.  Second Obtuse Marginal Branch The vessel is small in size.  Right Coronary Artery discrete .  Right Posterior Atrioventricular Artery discrete located at the bend .  Intervention  Dist  RCA lesion PCI The pre-interventional distal flow is normal (TIMI 3). Pre-stent angioplasty was performed. A drug-eluting stent was placed. Minimum lumen area: 2.5 mm. The strut is apposed. Post-stent angioplasty was performed. Maximum pressure: 15 atm. The post-interventional distal flow is normal (TIMI 3). The intervention was successful. No complications occurred at this lesion. Pressure wire/FFR was not performed on the lesion. There is a 0% residual stenosis post intervention.   STRESS TESTS  MYOCARDIAL PERFUSION IMAGING 06/23/2017  Narrative  The left ventricular ejection fraction is normal (55-65%).  Nuclear stress EF: 63%.  Blood pressure demonstrated a normal response to exercise.  No T wave inversion was noted during stress.  There was no ST segment deviation noted during stress.  The study is normal.  This is a low risk study.  Normal perfusion. LVEF 63% with normal wall motion. This is a low risk study.            ______________________________________________________________________________________________       EKG Interpretation Date/Time:  Tuesday July 18 2023 10:03:07 EDT Ventricular Rate:  83 PR Interval:  148 QRS Duration:  140 QT Interval:  404 QTC Calculation: 474 R Axis:   65  Text Interpretation: Normal sinus rhythm Right bundle branch block When compared with ECG of 26-Jun-2018 01:55, Right bundle branch block is now Present Confirmed by Micah Flesher ([id number]) on 07/18/2023 10:08:19 AM    Recent Labs: No results found for requested labs within last 365 days.  Recent Lipid Panel    Component Value Date/Time   CHOL 98 06/26/2018 0524   TRIG 123 06/26/2018 0524   HDL 27 (L) 06/26/2018 0524   CHOLHDL 3.6 06/26/2018 0524   VLDL 25 06/26/2018 0524   LDLCALC 46 06/26/2018 0524     Risk Assessment/Calculations:                Physical Exam:    VS:  BP 128/84 (BP Location: Right Arm, Patient Position: Sitting, Cuff Size: Normal)    Pulse 83   Ht 5\' 11"  (1.803 m)   Wt 223 lb (101.2 kg)   SpO2 94%   BMI 31.10 kg/m     Wt Readings from Last 3 Encounters:  07/18/23 223 lb (101.2 kg)  05/18/21 216 lb (98 kg)  07/06/18 221 lb (  100.2 kg)     GEN:  Well nourished, well developed in no acute distress HEENT: Normal NECK: No JVD; No carotid bruits LYMPHATICS: No lymphadenopathy CARDIAC: RRR, no murmurs, rubs, gallops RESPIRATORY:  Clear to auscultation without rales, wheezing or rhonchi  ABDOMEN: Soft, non-tender, non-distended MUSCULOSKELETAL:  No edema; No deformity  SKIN: Warm and dry NEUROLOGIC:  Alert and oriented x 3 PSYCHIATRIC:  Normal affect   ASSESSMENT:    1. Essential hypertension   2. CAD S/P percutaneous coronary angioplasty   3. Primary hypertension   4. Hyperlipidemia with target LDL less than 70   5. Gastrointestinal hemorrhage with melena   6. Stage 3b chronic kidney disease (HCC)   7. Type 2 diabetes mellitus with stage 3b chronic kidney disease, with long-term current use of insulin (HCC)    PLAN:    In order of problems listed above:  CAD DES-RCA 2016 Residual nonosbstructive CAD in the LAD and RI - plavix monotherapy - will refill this   Hypertension - continue 10 mg amlodipine, 100 mg lopressor BID, and valsartan-hydrochlorothiazide 160-25 mg daily - collect BMP   Hyperlipidemia with LDL goal < 70 Last lipid panel 12/2022: LDL 30, trig 181, HDL 30 Consider LPA Continue 80 mg lipitor Could consider a lower goal given IDDM   IDDM - type 2 - on novolog and trulicity - last A1c 8.3% - following with Dr. Kathreen Cosier - following with endocrinology - he was unable to afford one of the injectables, but just started, so follow   CKD - follows with [facility] nephrology - sCr increased, felt related to uncontrolled DM when he couldn't get medications   Follow up with me in 1 year.     Medication Adjustments/Labs and Tests Ordered: Current medicines are reviewed at length  with the patient today.  Concerns regarding medicines are outlined above.  Orders Placed This Encounter  Procedures   EKG 12-Lead   Meds ordered this encounter  Medications   clopidogrel (PLAVIX) 75 MG tablet    Sig: Take 1 tablet (75 mg total) by mouth daily.    Dispense:  90 tablet    Refill:  3    Pt must keep upcoming appt in March 2025 with Cardiologist before anymore refills. Thank you Final Attempt   nitroGLYCERIN (NITROSTAT) 0.4 MG SL tablet    Sig: Place 1 tablet (0.4 mg total) under the tongue every 5 (five) minutes as needed for chest pain.    Dispense:  25 tablet    Refill:  4    Patient Instructions  Medication Instructions:  Your physician recommends that you continue on your current medications as directed. Please refer to the Current Medication list given to you today.  *If you need a refill on your cardiac medications before your next appointment, please call your pharmacy*   Follow-Up: At [facility], you and your health needs are our priority.  As part of our continuing mission to provide you with exceptional heart care, we have created designated Provider Care Teams.  These Care Teams include your primary Cardiologist (physician) and Advanced Practice Providers (APPs -  Physician Assistants and Nurse Practitioners) who all work together to provide you with the care you need, when you need it.   Your next appointment:   1 year(s)  Provider:   Micah Flesher, PA-C  Other Instructions             Signed, Hayden Rutherford Bellarose Burtt, PA  07/18/2023 10:37 AM    [facility]

## 2023-07-17 NOTE — Telephone Encounter (Signed)
 Pt's medication was sent to pt's pharmacy as requested. Confirmation received.

## 2023-07-17 NOTE — Telephone Encounter (Signed)
*  STAT* If patient is at the pharmacy, call can be transferred to refill team.   1. Which medications need to be refilled? (please list name of each medication and dose if known)   clopidogrel (PLAVIX) 75 MG tablet   2. Would you like to learn more about the convenience, safety, & potential cost savings by using the [facility]?   3. Are you open to using the [facility] (Type [facility]. ).  4. Which pharmacy/location (including street and city if local pharmacy) is medication to be sent to?  [facility] [address], [place] - [address]   5. Do they need a 30 day or 90 day supply?   90 day  Patient stated he has 5 tablets left.

## 2023-07-18 ENCOUNTER — Encounter: Payer: Self-pay | Admitting: Physician Assistant

## 2023-07-18 ENCOUNTER — Ambulatory Visit: Payer: Medicare (Managed Care) | Attending: Physician Assistant | Admitting: Physician Assistant

## 2023-07-18 VITALS — BP 128/84 | HR 83 | Ht 71.0 in | Wt 223.0 lb

## 2023-07-18 DIAGNOSIS — E785 Hyperlipidemia, unspecified: Secondary | ICD-10-CM | POA: Diagnosis not present

## 2023-07-18 DIAGNOSIS — I10 Essential (primary) hypertension: Secondary | ICD-10-CM | POA: Diagnosis not present

## 2023-07-18 DIAGNOSIS — K921 Melena: Secondary | ICD-10-CM

## 2023-07-18 DIAGNOSIS — I2510 Atherosclerotic heart disease of native coronary artery without angina pectoris: Secondary | ICD-10-CM

## 2023-07-18 DIAGNOSIS — E1122 Type 2 diabetes mellitus with diabetic chronic kidney disease: Secondary | ICD-10-CM

## 2023-07-18 DIAGNOSIS — Z9861 Coronary angioplasty status: Secondary | ICD-10-CM

## 2023-07-18 DIAGNOSIS — N1832 Chronic kidney disease, stage 3b: Secondary | ICD-10-CM

## 2023-07-18 DIAGNOSIS — Z794 Long term (current) use of insulin: Secondary | ICD-10-CM

## 2023-07-18 MED ORDER — NITROGLYCERIN 0.4 MG SL SUBL: 0.4000 mg | SUBLINGUAL_TABLET | SUBLINGUAL | 4 refills | Status: DC | PRN

## 2023-07-18 MED ORDER — CLOPIDOGREL BISULFATE 75 MG PO TABS: 75.0000 mg | ORAL_TABLET | Freq: Every day | ORAL | 3 refills | Status: DC

## 2023-07-18 NOTE — Patient Instructions (Signed)
 Medication Instructions:  Your physician recommends that you continue on your current medications as directed. Please refer to the Current Medication list given to you today.  *If you need a refill on your cardiac medications before your next appointment, please call your pharmacy*   Follow-Up: At [facility], you and your health needs are our priority.  As part of our continuing mission to provide you with exceptional heart care, we have created designated Provider Care Teams.  These Care Teams include your primary Cardiologist (physician) and Advanced Practice Providers (APPs -  Physician Assistants and Nurse Practitioners) who all work together to provide you with the care you need, when you need it.   Your next appointment:   1 year(s)  Provider:   Micah Flesher, PA-C  Other Instructions

## 2023-10-25 ENCOUNTER — Emergency Department (HOSPITAL_COMMUNITY): Payer: Medicare (Managed Care)

## 2023-10-25 ENCOUNTER — Other Ambulatory Visit: Payer: Self-pay

## 2023-10-25 ENCOUNTER — Emergency Department (HOSPITAL_COMMUNITY)
Admission: EM | Admit: 2023-10-25 | Discharge: 2023-10-26 | Discharge status: Against Medical Advice | Payer: Medicare (Managed Care) | Attending: Emergency Medicine | Admitting: Emergency Medicine

## 2023-10-25 ENCOUNTER — Encounter (HOSPITAL_COMMUNITY): Payer: Self-pay

## 2023-10-25 DIAGNOSIS — R531 Weakness: Secondary | ICD-10-CM | POA: Insufficient documentation

## 2023-10-25 DIAGNOSIS — R4701 Aphasia: Secondary | ICD-10-CM | POA: Diagnosis not present

## 2023-10-25 DIAGNOSIS — Z5321 Procedure and treatment not carried out due to patient leaving prior to being seen by health care provider: Secondary | ICD-10-CM | POA: Insufficient documentation

## 2023-10-25 DIAGNOSIS — Y900 Blood alcohol level of less than 20 mg/100 ml: Secondary | ICD-10-CM | POA: Insufficient documentation

## 2023-10-25 DIAGNOSIS — R200 Anesthesia of skin: Secondary | ICD-10-CM | POA: Insufficient documentation

## 2023-10-25 DIAGNOSIS — R519 Headache, unspecified: Secondary | ICD-10-CM | POA: Diagnosis not present

## 2023-10-25 LAB — I-STAT CHEM 8, ED
BUN: 38 mg/dL — ABNORMAL HIGH (ref 8–23)
Calcium, Ion: 1.15 mmol/L (ref 1.15–1.40)
Chloride: 104 mmol/L (ref 98–111)
Creatinine, Ser: 2.7 mg/dL — ABNORMAL HIGH (ref 0.61–1.24)
Glucose, Bld: 206 mg/dL — ABNORMAL HIGH (ref 70–99)
HCT: 37 % — ABNORMAL LOW (ref 39.0–52.0)
Hemoglobin: 12.6 g/dL — ABNORMAL LOW (ref 13.0–17.0)
Potassium: 4.1 mmol/L (ref 3.5–5.1)
Sodium: 141 mmol/L (ref 135–145)
TCO2: 22 mmol/L (ref 22–32)

## 2023-10-25 LAB — COMPREHENSIVE METABOLIC PANEL WITH GFR
ALT: 40 U/L (ref 0–44)
AST: 30 U/L (ref 15–41)
Albumin: 3.5 g/dL (ref 3.5–5.0)
Alkaline Phosphatase: 76 U/L (ref 38–126)
Anion gap: 10 (ref 5–15)
BUN: 36 mg/dL — ABNORMAL HIGH (ref 8–23)
CO2: 23 mmol/L (ref 22–32)
Calcium: 8.7 mg/dL — ABNORMAL LOW (ref 8.9–10.3)
Chloride: 105 mmol/L (ref 98–111)
Creatinine, Ser: 2.49 mg/dL — ABNORMAL HIGH (ref 0.61–1.24)
GFR, Estimated: 28 mL/min — ABNORMAL LOW (ref >60)
Glucose, Bld: 210 mg/dL — ABNORMAL HIGH (ref 70–99)
Potassium: 4.1 mmol/L (ref 3.5–5.1)
Sodium: 138 mmol/L (ref 135–145)
Total Bilirubin: 0.8 mg/dL (ref 0.0–1.2)
Total Protein: 7 g/dL (ref 6.5–8.1)

## 2023-10-25 LAB — DIFFERENTIAL
Abs Immature Granulocytes: 0.01 10*3/uL (ref 0.00–0.07)
Basophils Absolute: 0.1 10*3/uL (ref 0.0–0.1)
Basophils Relative: 1 %
Eosinophils Absolute: 0.1 10*3/uL (ref 0.0–0.5)
Eosinophils Relative: 2 %
Immature Granulocytes: 0 %
Lymphocytes Relative: 26 %
Lymphs Abs: 1.2 10*3/uL (ref 0.7–4.0)
Monocytes Absolute: 0.5 10*3/uL (ref 0.1–1.0)
Monocytes Relative: 9 %
Neutro Abs: 3 10*3/uL (ref 1.7–7.7)
Neutrophils Relative %: 62 %

## 2023-10-25 LAB — CBC
HCT: 37.3 % — ABNORMAL LOW (ref 39.0–52.0)
Hemoglobin: 13.3 g/dL (ref 13.0–17.0)
MCH: 32 pg (ref 26.0–34.0)
MCHC: 35.7 g/dL (ref 30.0–36.0)
MCV: 89.7 fL (ref 80.0–100.0)
Platelets: 246 10*3/uL (ref 150–400)
RBC: 4.16 MIL/uL — ABNORMAL LOW (ref 4.22–5.81)
RDW: 12.3 % (ref 11.5–15.5)
WBC: 4.9 10*3/uL (ref 4.0–10.5)
nRBC: 0 % (ref 0.0–0.2)

## 2023-10-25 LAB — ETHANOL: Alcohol, Ethyl (B): <15 mg/dL (ref <15)

## 2023-10-25 LAB — RAPID URINE DRUG SCREEN, HOSP PERFORMED
Amphetamines: NOT DETECTED
Barbiturates: NOT DETECTED
Benzodiazepines: NOT DETECTED
Cocaine: NOT DETECTED
Opiates: NOT DETECTED
Tetrahydrocannabinol: NOT DETECTED

## 2023-10-25 LAB — PROTIME-INR
INR: 1 (ref 0.8–1.2)
Prothrombin Time: 13 s (ref 11.4–15.2)

## 2023-10-25 LAB — APTT: aPTT: 32 s (ref 24–36)

## 2023-10-25 NOTE — ED Triage Notes (Signed)
 Pt reports numbness to entire right leg and foot that started last night around 8pm and has not gone away. He also reports numbness to his right arm as well that started around the same time last night. Speech is clear, face is symmetrical, no weakness. He is ambulatory with independent steady gait.

## 2023-10-25 NOTE — ED Notes (Signed)
 Pt to Ct

## 2023-10-25 NOTE — ED Notes (Signed)
 Patient reports continued right foot and leg numbness along with mild numbness in right arm. Reports these symptoms have been present since 8pm last night. Denies any worsening symptoms - states the numbness feels the same and has not changed since he got here. Updated patient on plan of care and vitals obtained.

## 2023-10-25 NOTE — ED Provider Triage Note (Signed)
 Emergency Medicine Provider Triage Evaluation Note  Hayden Williams , a 68 y.o. male  was evaluated in triage.  Pt complains of unilateral numbness.  Review of Systems  Positive: Right arm and leg numb Negative: Weakness, aphasia, headache  Physical Exam  BP 119/81 (BP Location: Left Arm)   Pulse 74   Temp 98.2 F (36.8 C)   Resp 14   Ht 5' 11 (1.803 m)   Wt 97.5 kg   SpO2 95%   BMI 29.99 kg/m  Gen:   Awake, no distress   Resp:  Normal effort  MSK:   Moves extremities without difficulty  Other:   Medical Decision Making  Medically screening exam initiated at 4:46 PM.  Appropriate orders placed.  Hayden Williams was informed that the remainder of the evaluation will be completed by another provider, this initial triage assessment does not replace that evaluation, and the importance of remaining in the ED until their evaluation is complete.  Symptoms that started last night of numbness without weakness to right arm and right leg. No falls. No headache. No history of stroke. Symptoms continue to be present today, prompting ED evaluation.    Mandy Second, PA-C 10/25/23 1647

## 2023-10-26 ENCOUNTER — Other Ambulatory Visit: Payer: Self-pay

## 2023-10-26 ENCOUNTER — Encounter (HOSPITAL_BASED_OUTPATIENT_CLINIC_OR_DEPARTMENT_OTHER): Payer: Self-pay

## 2023-10-26 ENCOUNTER — Observation Stay (HOSPITAL_BASED_OUTPATIENT_CLINIC_OR_DEPARTMENT_OTHER)
Admission: EM | Admit: 2023-10-26 | Discharge: 2023-10-27 | Disposition: A | Discharge status: Home / Self Care | Payer: Medicare (Managed Care) | Attending: Internal Medicine | Admitting: Internal Medicine

## 2023-10-26 ENCOUNTER — Emergency Department (HOSPITAL_COMMUNITY): Payer: Medicare (Managed Care)

## 2023-10-26 DIAGNOSIS — Z91010 Allergy to peanuts: Secondary | ICD-10-CM | POA: Insufficient documentation

## 2023-10-26 DIAGNOSIS — I6523 Occlusion and stenosis of bilateral carotid arteries: Secondary | ICD-10-CM | POA: Diagnosis not present

## 2023-10-26 DIAGNOSIS — R200 Anesthesia of skin: Principal | ICD-10-CM

## 2023-10-26 DIAGNOSIS — Z79899 Other long term (current) drug therapy: Secondary | ICD-10-CM | POA: Diagnosis not present

## 2023-10-26 DIAGNOSIS — I6389 Other cerebral infarction: Secondary | ICD-10-CM | POA: Diagnosis not present

## 2023-10-26 DIAGNOSIS — I2510 Atherosclerotic heart disease of native coronary artery without angina pectoris: Secondary | ICD-10-CM | POA: Diagnosis not present

## 2023-10-26 DIAGNOSIS — E1129 Type 2 diabetes mellitus with other diabetic kidney complication: Secondary | ICD-10-CM | POA: Diagnosis present

## 2023-10-26 DIAGNOSIS — Z794 Long term (current) use of insulin: Secondary | ICD-10-CM | POA: Insufficient documentation

## 2023-10-26 DIAGNOSIS — N184 Chronic kidney disease, stage 4 (severe): Secondary | ICD-10-CM | POA: Diagnosis not present

## 2023-10-26 DIAGNOSIS — I10 Essential (primary) hypertension: Secondary | ICD-10-CM | POA: Diagnosis present

## 2023-10-26 DIAGNOSIS — Z7901 Long term (current) use of anticoagulants: Secondary | ICD-10-CM | POA: Insufficient documentation

## 2023-10-26 DIAGNOSIS — E1122 Type 2 diabetes mellitus with diabetic chronic kidney disease: Secondary | ICD-10-CM | POA: Diagnosis not present

## 2023-10-26 DIAGNOSIS — I129 Hypertensive chronic kidney disease with stage 1 through stage 4 chronic kidney disease, or unspecified chronic kidney disease: Secondary | ICD-10-CM | POA: Insufficient documentation

## 2023-10-26 DIAGNOSIS — R41841 Cognitive communication deficit: Secondary | ICD-10-CM | POA: Diagnosis not present

## 2023-10-26 DIAGNOSIS — I639 Cerebral infarction, unspecified: Secondary | ICD-10-CM | POA: Diagnosis not present

## 2023-10-26 DIAGNOSIS — N189 Chronic kidney disease, unspecified: Secondary | ICD-10-CM | POA: Insufficient documentation

## 2023-10-26 LAB — GLUCOSE, CAPILLARY: Glucose-Capillary: 211 mg/dL — ABNORMAL HIGH (ref 70–99)

## 2023-10-26 LAB — ECHOCARDIOGRAM COMPLETE
Area-P 1/2: 3.83 cm2
Height: 71 in
S' Lateral: 2.7 cm
Weight: 3439.99 [oz_av]

## 2023-10-26 LAB — HIV ANTIBODY (ROUTINE TESTING W REFLEX): HIV Screen 4th Generation wRfx: NONREACTIVE

## 2023-10-26 MED ORDER — INSULIN ASPART 100 UNIT/ML IJ SOLN
3.0000 [IU] | Freq: Three times a day (TID) | INTRAMUSCULAR | Status: DC
  Administered 2023-10-26 – 2023-10-27 (×2): 3 [IU] via SUBCUTANEOUS

## 2023-10-26 MED ORDER — STROKE: EARLY STAGES OF RECOVERY BOOK
Freq: Once | Status: AC
  Administered 2023-10-26: 1
  Filled 2023-10-26: qty 1

## 2023-10-26 MED ORDER — ACETAMINOPHEN 160 MG/5ML PO SOLN: 650.0000 mg | ORAL | Status: DC | PRN

## 2023-10-26 MED ORDER — ACETAMINOPHEN 650 MG RE SUPP: 650.0000 mg | RECTAL | Status: DC | PRN

## 2023-10-26 MED ORDER — CLOPIDOGREL BISULFATE 75 MG PO TABS
75.0000 mg | ORAL_TABLET | Freq: Every day | ORAL | Status: DC
  Administered 2023-10-26 – 2023-10-27 (×2): 75 mg via ORAL
  Filled 2023-10-26 (×2): qty 1

## 2023-10-26 MED ORDER — ACETAMINOPHEN 325 MG PO TABS: 650.0000 mg | ORAL_TABLET | ORAL | Status: DC | PRN

## 2023-10-26 MED ORDER — NITROGLYCERIN 0.4 MG SL SUBL: 0.4000 mg | SUBLINGUAL_TABLET | SUBLINGUAL | Status: DC | PRN

## 2023-10-26 MED ORDER — DULAGLUTIDE 1.5 MG/0.5ML ~~LOC~~ SOAJ: 1.5000 mg | SUBCUTANEOUS | Status: DC

## 2023-10-26 MED ORDER — ASPIRIN 81 MG PO TBEC
81.0000 mg | DELAYED_RELEASE_TABLET | Freq: Every day | ORAL | Status: DC
  Administered 2023-10-26: 81 mg via ORAL
  Filled 2023-10-26: qty 1

## 2023-10-26 MED ORDER — SENNOSIDES-DOCUSATE SODIUM 8.6-50 MG PO TABS: 1.0000 | ORAL_TABLET | Freq: Every evening | ORAL | Status: DC | PRN

## 2023-10-26 MED ORDER — ENOXAPARIN SODIUM 30 MG/0.3ML IJ SOSY
30.0000 mg | PREFILLED_SYRINGE | INTRAMUSCULAR | Status: DC
  Administered 2023-10-26 – 2023-10-27 (×2): 30 mg via SUBCUTANEOUS
  Filled 2023-10-26 (×2): qty 0.3

## 2023-10-26 MED ORDER — INSULIN ASPART 100 UNIT/ML FLEXPEN
3.0000 [IU] | PEN_INJECTOR | Freq: Three times a day (TID) | SUBCUTANEOUS | Status: DC
  Filled 2023-10-26: qty 3

## 2023-10-26 MED ORDER — METOPROLOL TARTRATE 50 MG PO TABS
100.0000 mg | ORAL_TABLET | Freq: Two times a day (BID) | ORAL | Status: DC
  Administered 2023-10-26 – 2023-10-27 (×3): 100 mg via ORAL
  Filled 2023-10-26 (×2): qty 2
  Filled 2023-10-26: qty 4

## 2023-10-26 MED ORDER — SODIUM CHLORIDE 0.9% FLUSH: 3.0000 mL | INTRAVENOUS | Status: DC | PRN

## 2023-10-26 MED ORDER — ASPIRIN 81 MG PO TBEC
81.0000 mg | DELAYED_RELEASE_TABLET | Freq: Every day | ORAL | Status: DC
  Administered 2023-10-26 – 2023-10-27 (×2): 81 mg via ORAL
  Filled 2023-10-26 (×2): qty 1

## 2023-10-26 MED ORDER — ALLOPURINOL 100 MG PO TABS
100.0000 mg | ORAL_TABLET | Freq: Two times a day (BID) | ORAL | Status: DC
  Administered 2023-10-26 – 2023-10-27 (×3): 100 mg via ORAL
  Filled 2023-10-26 (×3): qty 1

## 2023-10-26 MED ORDER — INSULIN GLARGINE-YFGN 100 UNIT/ML ~~LOC~~ SOLN
49.0000 [IU] | SUBCUTANEOUS | Status: DC
  Administered 2023-10-26: 49 [IU] via SUBCUTANEOUS
  Filled 2023-10-26 (×2): qty 0.49

## 2023-10-26 MED ORDER — ATORVASTATIN CALCIUM 80 MG PO TABS
80.0000 mg | ORAL_TABLET | Freq: Every day | ORAL | Status: DC
  Administered 2023-10-26 – 2023-10-27 (×2): 80 mg via ORAL
  Filled 2023-10-26: qty 1
  Filled 2023-10-26: qty 2

## 2023-10-26 MED ORDER — SODIUM CHLORIDE 0.9% FLUSH
3.0000 mL | Freq: Two times a day (BID) | INTRAVENOUS | Status: DC
  Administered 2023-10-27: 10 mL via INTRAVENOUS

## 2023-10-26 MED ORDER — INSULIN GLARGINE (1 UNIT DIAL) 300 UNIT/ML ~~LOC~~ SOPN: 49.0000 [IU] | PEN_INJECTOR | Freq: Every day | SUBCUTANEOUS | Status: DC

## 2023-10-26 MED ORDER — PANTOPRAZOLE SODIUM 40 MG PO TBEC
40.0000 mg | DELAYED_RELEASE_TABLET | Freq: Every day | ORAL | Status: DC
  Administered 2023-10-26 – 2023-10-27 (×2): 40 mg via ORAL
  Filled 2023-10-26 (×2): qty 1

## 2023-10-26 NOTE — ED Notes (Signed)
 Pt stated the wait was to long, and that he would be commuting to [place].

## 2023-10-26 NOTE — ED Provider Notes (Signed)
 Patient sent to [facility] for MRI of his brain and neck.  2 days ago patient began having numbness and loss of sensation in his right foot and his left leg.  Patient reports he has some numbness in his right arm.  No weakness in his arm.  Patient has a past medical history of diabetes hypertension and coronary artery disease. MRI shows 9mm acute ischemic left paramedian medullar infarct.  I spoke with Dr. Murvin Arthurs.  Pt seen by teleneuro at 5:06 am Dr. Murvin Arthurs advised plavix  and asa.  Admit to medicine service     Sandi Crosby, PA-C 10/26/23 1005    [address], Victoria K, DO 10/26/23 1104

## 2023-10-26 NOTE — ED Notes (Signed)
 Patient going to [facility] for [facility]--Dr.  Carol Chroman accepting

## 2023-10-26 NOTE — Plan of Care (Signed)
 Plan of care is reviewed. Pt has been progressing. He is stable hemodynamically, afebrile, no acute distress. He has a complaint of right side numbness and tingling. NIHSS checking q 4 hrs. Stroke education is provided with booklet. All questions are answered. Pt and family members expresses their  appreciations. We will continue to monitor. Problem: Ischemic Stroke/TIA Tissue Perfusion: Goal: Complications of ischemic stroke/TIA will be minimized Outcome: Progressing   Problem: Education: Goal: Knowledge of disease or condition will improve Outcome: Progressing Goal: Knowledge of secondary prevention will improve (MUST DOCUMENT ALL) Outcome: Progressing Goal: Knowledge of patient specific risk factors will improve (DELETE if not current risk factor) Outcome: Progressing   Problem: Coping: Goal: Will verbalize positive feelings about self Outcome: Progressing Goal: Will identify appropriate support needs Outcome: Progressing   Problem: Health Behavior/Discharge Planning: Goal: Ability to manage health-related needs will improve Outcome: Progressing Goal: Goals will be collaboratively established with patient/family Outcome: Progressing   Problem: Self-Care: Goal: Ability to participate in self-care as condition permits will improve Outcome: Progressing Goal: Verbalization of feelings and concerns over difficulty with self-care will improve Outcome: Progressing Goal: Ability to communicate needs accurately will improve Outcome: Progressing   Problem: Education: Goal: Knowledge of General Education information will improve Description: Including pain rating scale, medication(s)/side effects and non-pharmacologic comfort measures Outcome: Progressing   Problem: Health Behavior/Discharge Planning: Goal: Ability to manage health-related needs will improve Outcome: Progressing   Problem: Clinical Measurements: Goal: Ability to maintain clinical measurements within normal limits  will improve Outcome: Progressing Goal: Will remain free from infection Outcome: Progressing Goal: Diagnostic test results will improve Outcome: Progressing Goal: Respiratory complications will improve Outcome: Progressing Goal: Cardiovascular complication will be avoided Outcome: Progressing  Brain Cahill, RN

## 2023-10-26 NOTE — ED Notes (Signed)
 MRI technician made aware of pts arrival. MR orders confirmed. Pt updated regarding POC - pt understands he is going out to lobby awaiting to be called for MRI. Pt ambulatory without difficulty.

## 2023-10-26 NOTE — Consult Note (Signed)
 [facility] Tele[facility]urology Consult Services  Stat Consult  Patient Name:   Hayden Williams, Hayden Williams Date of Birth:   1956/02/07 Identification Number:   CSN - [id number] Date of Service:   10/26/2023 04:12:43  Diagnosis:       R20.2 - Paresthesia of skin  Impression Right leg greater than arm paresthesias and sensation of being off balance, starting 2 days ago. Differential diagnosis includes lacunar ischemic stroke as well as potential cervical spine disease, particularly given recent activity level change.  Recommendations: - MRI brain and cervical spine - Continue clopidogrel , atorvastatin , glycemic control   Recommendations: Our recommendations are outlined below.  Laboratory Studies : Lipid panel I ordered Hemoglobin A1c  Antithrombotic Medication : Clopidogrel  75 mg daily Statins for LDL goal less than 70  Nursing Recommendations : IV Fluids, avoid dextrose  containing fluids, Maintain euglycemia Neuro checks q4 hrs x 24 hrs and then per shift Head of bed 30 degrees Continue with Telemetry  Consultations : Recommend Speech therapy if failed dysphagia screen Physical therapy/Occupational therapy  DVT Prophylaxis : Choice of Primary Team  Disposition : Neurology will follow    ----------------------------------------------------------------------------------------------------    Metrics: Dispatch Time: 10/26/2023 04:03:46 Callback Response Time: 10/26/2023 04:13:11  Primary Provider Notified of Diagnostic Impression and Management Plan on: 10/26/2023 05:03:36   CT HEAD: I personally reviewed all the CT images that were available to me and it showed: no acute pathology   Labs Cr 2.49 UDS, EtOH negative, CBC unremarkable   ----------------------------------------------------------------------------------------------------  Chief Complaint: right sided numbness  History of Present Illness: Patient is a 68 year old Hayden Williams. Patient is  a 68 year old man presenting to the ED with right sided leg more than arm numbness since 2 nights ago at about 8pm. He was sitting down when he noticed his right leg fell asleep, from the buttock, down the lateral aspect of the thigh and calf, and into the top/outside of the right foot. He has had intermittent right lateral foot numbness for quite a while, however this was more extensive and persistent. He later noticed that his right arm also felt numb, specifically the posterior upper arm and forearm down to the middle three digits of the right hand. He denies neck or back pain, however did just start to notice some right buttock discomfort within the past hour. In addition to the above, he has been feeling somewhat more off-balance over the past couple of days. No double vision or visual loss, no new weakness, no new bladder dysfunction. The only recent activity change was that he went golfing on Father's day, for the first time in quite a while.  No smoking or drug use, rare alcohol use   Past Medical History:      Hypertension      Diabetes Mellitus      Hyperlipidemia      Coronary Artery Disease      There is no history of Atrial Fibrillation      There is no history of Stroke      There is no history of Seizures Other PMH:  BPH, CKD, OSA  Medications:  No Anticoagulant use  Antiplatelet use: Yes clopidogrel  Reviewed EMR for current medications Other Medications Pertinent To Assessment Include: Flomax, atorvastatin , HCTZ, insulin , Toujeo , Trulicity, amlopipine, allopurinol, metoprolol   Allergies:  Reviewed Description: lisinopril, peanut  Social History: Smoking: No Alcohol Use: Yes  Family History:  There is no family history of premature cerebrovascular disease pertinent to this consultation  ROS : 14 Points  Review of Systems was performed and was negative except mentioned in HPI.  Past Surgical History: There Is No Surgical History Contributory To Today's  Visit   Examination: BP(135/93), Pulse(73), Blood Glucose(210) 1A: Level of Consciousness - Alert; keenly responsive + 0 1B: Ask Month and Age - Both Questions Right + 0 1C: Blink Eyes & Squeeze Hands - Performs Both Tasks + 0 2: Test Horizontal Extraocular Movements - Normal + 0 3: Test Visual Fields - No Visual Loss + 0 4: Test Facial Palsy (Use Grimace if Obtunded) - Normal symmetry + 0 5A: Test Left Arm Motor Drift - No Drift for 10 Seconds + 0 5B: Test Right Arm Motor Drift - No Drift for 10 Seconds + 0 6A: Test Left Leg Motor Drift - No Drift for 5 Seconds + 0 6B: Test Right Leg Motor Drift - No Drift for 5 Seconds + 0 7: Test Limb Ataxia (FNF/Heel-Shin) - No Ataxia + 0 8: Test Sensation - Mild-Moderate Loss: Can Sense Being Touched + 1 9: Test Language/Aphasia - Normal; No aphasia + 0 10: Test Dysarthria - Normal + 0 11: Test Extinction/Inattention - No abnormality + 0  NIHSS Score: 1 NIHSS Free Text : right eye impaired vision after prior cataract surgery, baseline right exotropia  Spoke with : Dr. Lula Sale    This consult was conducted in real time using interactive audio and Immunologist. Patient was informed of the technology being used for this visit and agreed to proceed. Patient located in hospital and provider located at home/office setting.  Patient is being evaluated for possible acute neurologic impairment and high probability of imminent or life - threatening deterioration.I spent total of 45 minutes providing care to this patient, including time for face to face visit via telemedicine, review of medical records, imaging studies and discussion of findings with providers, the patient and / or family.   Dr Isidor Marek   [facility] For Inpatient follow-up with [facility] physician please call [facility] at [phone]. As we are not an outpatient service for any post hospital discharge needs please contact the hospital for assistance.  If you have any  questions for the [facility] physicians or need to reconsult for clinical or diagnostic changes please contact us  via [facility] at [phone].

## 2023-10-26 NOTE — ED Provider Notes (Signed)
 [place] EMERGENCY DEPARTMENT AT [facility] Provider Note   CSN: [id number] Arrival date & time: 10/26/23  0202     Patient presents with: Numbness   Hayden Williams is a 68 y.o. male.   Patient is a 68 year old male with past medical history of coronary artery disease with stent, diabetes, chronic renal insufficiency.  Patient presented today with complaints of numbness in his right leg and to a lesser extent his right arm.  This seemed to start suddenly at approximately 8 PM 2 evenings ago.  He went to bed thinking the symptoms would resolve, but woke up the next day with ongoing numbness.  He went to work, then presented to [employer] prior to coming here.  He was triaged and had a head CT and laboratory studies performed, then waited in the waiting room for 12 hours.  He left without being seen and came here.  He reports ongoing numbness to the right leg and right arm.  He denies any back or neck pain.       Prior to Admission medications   Medication Sig Start Date End Date Taking? Authorizing Provider  allopurinol (ZYLOPRIM) 100 MG tablet Take 100 mg by mouth 2 (two) times daily. 09/03/21   [provider]  amLODipine  (NORVASC ) 10 MG tablet Take 10 mg by mouth at bedtime.  02/23/15   [provider]  Ascorbic Acid (VITAMIN C ) 500 MG CAPS Take 500 mg by mouth daily.    [provider]  atorvastatin  (LIPITOR ) 80 MG tablet TAKE 1 TABLET BY MOUTH DAILY 06/02/23   Odie Benne, MD  Cholecalciferol (VITAMIN D) 2000 UNITS CAPS Take 2,000 Units by mouth daily.    [provider]  clopidogrel  (PLAVIX ) 75 MG tablet Take 1 tablet (75 mg total) by mouth daily. 07/18/23   [facility], Warren Haber, PA  Continuous Blood Gluc Sensor (DEXCOM G6 SENSOR) MISC 1 patch See admin instructions. Place 1 new sensor every 10 days to obtain continuous glucose readings 02/01/18   [provider]  Dulaglutide (TRULICITY) 1.5 MG/0.5ML SOPN Inject 1.5 mg  into the skin once a week. 01/30/19   [provider]  insulin  glargine, 1 Unit Dial, (TOUJEO ) 300 UNIT/ML Solostar Pen Inject 44 Units into the skin daily. 01/11/19   [provider]  Iron-Vitamin C  65-125 MG TABS Take 1 tablet by mouth daily.    [provider]  ketoconazole (NIZORAL) 2 % cream Apply 1 application topically as needed (to right foot several times a week for irritation).     [provider]  metoprolol  tartrate (LOPRESSOR ) 100 MG tablet TAKE 1 TABLET BY MOUTH 2 TIMES A DAY 06/06/23   Odie Benne, MD  MITIGARE  0.6 MG CAPS Take 1 capsule by mouth daily as needed (for gout flares). 12/09/14   [provider]  nitroGLYCERIN  (NITROSTAT ) 0.4 MG SL tablet Place 1 tablet (0.4 mg total) under the tongue every 5 (five) minutes as needed for chest pain. 07/18/23   [facility], Warren Haber, PA  NOVOLOG  FLEXPEN 100 UNIT/ML FlexPen Inject 2-11 Units into the skin See admin instructions. Inject 2-11 units into the skin two times a day with meals, per sliding scale 06/26/16   [provider]  pantoprazole  (PROTONIX ) 40 MG tablet Take 1 tablet (40 mg total) by mouth daily. 05/18/21   Odie Benne, MD  Podiatric Products (GOLD BOND FOOT) CREA Apply 1 application topically daily as needed (cracked feet).     [provider]  valsartan -hydrochlorothiazide  (DIOVAN -HCT) 160-25 MG tablet Take 1 tablet by mouth daily. 05/18/21   Odie Benne, MD    Allergies: Peanut oil, Peanut-containing drug products, Lisinopril, and Niacin    Review of Systems  All other systems reviewed and are negative.   Updated Vital Signs BP (!) 135/93 (BP Location: Left Arm)   Pulse 73   Temp 97.7 F (36.5 C) (Oral)   Resp 16   Ht 5' 11 (1.803 m)   Wt 97.5 kg   SpO2 94%   BMI 29.99 kg/m   Physical Exam Vitals and nursing note reviewed.  Constitutional:      General: He is not in acute distress.    Appearance: He is well-developed.  He is not diaphoretic.  HENT:     Head: Normocephalic and atraumatic.   Eyes:     Extraocular Movements: Extraocular movements intact.     Pupils: Pupils are equal, round, and reactive to light.    Cardiovascular:     Rate and Rhythm: Normal rate and regular rhythm.     Heart sounds: No murmur heard.    No friction rub.  Pulmonary:     Effort: Pulmonary effort is normal. No respiratory distress.     Breath sounds: Normal breath sounds. No wheezing or rales.  Abdominal:     General: Bowel sounds are normal. There is no distension.     Palpations: Abdomen is soft.     Tenderness: There is no abdominal tenderness.   Musculoskeletal:        General: Normal range of motion.     Cervical back: Normal range of motion and neck supple.   Skin:    General: Skin is warm and dry.   Neurological:     General: No focal deficit present.     Mental Status: He is alert and oriented to person, place, and time.     Cranial Nerves: No cranial nerve deficit.     Motor: No weakness.     Coordination: Coordination normal.     Comments: No objective findings on neurologic exam.  He does describe decreased sensation primarily to his right lower leg.    (all labs ordered are listed, but only abnormal results are displayed) Labs Reviewed - No data to display  EKG: None  Radiology: CT HEAD WO CONTRAST Result Date: 10/25/2023 CLINICAL DATA:  Numbness to the entire right leg and foot. EXAM: CT HEAD WITHOUT CONTRAST TECHNIQUE: Contiguous axial images were obtained from the base of the skull through the vertex without intravenous contrast. RADIATION DOSE REDUCTION: This exam was performed according to the departmental dose-optimization program which includes automated exposure control, adjustment of the mA and/or kV according to patient size and/or use of iterative reconstruction technique. COMPARISON:  None Available. FINDINGS: Brain: There is generalized cerebral atrophy with widening of the  extra-axial spaces and ventricular dilatation. There are areas of decreased attenuation within the white matter tracts of the supratentorial brain, consistent with microvascular disease changes. Vascular: Mild to moderate severity calcification of the bilateral cavernous carotid arteries is noted. Skull: Normal. Negative for fracture or focal lesion. Sinuses/Orbits: No acute finding. Other: None. IMPRESSION: No acute intracranial abnormality. Electronically Signed   By: Virgle Grime M.D.   On: 10/25/2023 17:55     Procedures   Medications Ordered in the ED - No data to display  Medical Decision Making Amount and/or Complexity of Data Reviewed Radiology: ordered.   Patient is a 68 year old male presenting with numbness of his right leg and to a lesser extent his right arm.  He originally presented to [facility] where a head CT and laboratory studies were obtained, all of which were basically unremarkable.  After a prolonged wait, he came here for evaluation.  Patient arrives here with stable vital signs.  He has subjective numbness to the right leg and right arm, but is otherwise neurologically intact.  Consultation with teleneurology has recommended MRI of the head and cervical spine.  Patient will be sent back to the [facility] ER to undergo these examinations.     Final diagnoses:  None    ED Discharge Orders     None          Orvilla Blander, MD 10/26/23 [phone]

## 2023-10-26 NOTE — Evaluation (Signed)
 Speech Language Pathology Evaluation Patient Details Name: Hayden Williams MRN: [id number] DOB: [birth year] Today's Date: 10/26/2023 Time: 3244-0102 SLP Time Calculation (min) (ACUTE ONLY): 13 min  Problem List:  Patient Active Problem List   Diagnosis Date Noted   CVA (cerebral vascular accident) (HCC) 10/26/2023   CKD (chronic kidney disease) stage 4, GFR 15-29 ml/min (HCC) 10/26/2023   Type 2 diabetes mellitus with chronic kidney disease, with long-term current use of insulin  (HCC) 10/26/2023   Stroke (HCC) 10/26/2023   Melena    Iron deficiency anemia    Elevated troponin 06/26/2018   GIB (gastrointestinal bleeding) 06/25/2018   Chest pain 06/25/2018   Symptomatic anemia 06/25/2018   Essential hypertension    CAD (coronary artery disease) 12/03/2014   Acute coronary syndrome (HCC) 12/03/2014   Precordial chest pain 12/02/2014   CKD (chronic kidney disease) stage 3, GFR 30-59 ml/min (HCC) 12/02/2014   Unstable angina (HCC) 12/02/2014   Type II diabetes mellitus with renal manifestations (HCC) 01/13/2013   Past Medical History:  Past Medical History:  Diagnosis Date   CAD (coronary artery disease)    a. 11/2014 Cath/PCI: LM nl, LAD 50p, 60d, RI 40, LCX small, OM1 40, OM2 40, RCA 95d (2.25x20 Promus Premier DES), EF nl.   CKD (chronic kidney disease), stage III (HCC)    Diabetes mellitus (HCC)    Esophageal ulcer    Essential hypertension    GI bleed 06/2018   a. melena/ABL anemia with EGD on 06/27/18 with healed esophageal ulcer, esophagitis, hiatal hernia.   Hyperlipidemia    Sleep apnea    Past Surgical History:  Past Surgical History:  Procedure Laterality Date   ANKLE SURGERY Right    CARDIAC CATHETERIZATION N/A 12/02/2014   Procedure: Left Heart Cath and Coronary Angiography;  Surgeon: Odie Benne, MD;  Location: [facility] INVASIVE CV LAB;  Service: Cardiovascular;  Laterality: N/A;   CARDIAC CATHETERIZATION N/A 12/02/2014   Procedure: Coronary Stent Intervention;   Surgeon: Odie Benne, MD;  Location: [facility] INVASIVE CV LAB;  Service: Cardiovascular;  Laterality: N/A;   CATARACT EXTRACTION     CORONARY ANGIOPLASTY WITH STENT PLACEMENT     ESOPHAGOGASTRODUODENOSCOPY (EGD) WITH PROPOFOL  N/A 06/27/2018   Procedure: ESOPHAGOGASTRODUODENOSCOPY (EGD) WITH PROPOFOL ;  Surgeon: Asencion Blacksmith, MD;  Location: [facility] ENDOSCOPY;  Service: Endoscopy;  Laterality: N/A;   EYE SURGERY     INTRAOCULAR PROSTHESES INSERTION     HPI:  Hayden Williams is a 68 yo male presenting to [facility] ED 6/19 for RLE and RUE numbness. MRI shows 9 mm acute ischemic nonhemorrhagic L paramedian medullary infarct. Pt passed a yale swallow screen 6/19. EGD 2020 showed non-bleeding esophageal ulcer, medium-sized hiatal hernia, LA Grade C reflux esophagitis. PMH includes T2DM, CKD 3B, CAD, GIB, HTN   Assessment / Plan / Recommendation Clinical Impression  Pt voiced no concerns regarding cognition. He lives alone and is independent with all tasks at baseline. Pt scored 27/30 on the SLUMS, which is considered WFL. Further SLP f/u is not clinically indicated, will sign off.    SLP Assessment  SLP Recommendation/Assessment: Patient does not need any further Speech Language Pathology Services SLP Visit Diagnosis: Cognitive communication deficit (R41.841)     Assistance Recommended at Discharge  None  Functional Status Assessment Patient has not had a recent decline in their functional status  Frequency and Duration           SLP Evaluation Cognition  Overall Cognitive Status: Within Functional Limits for tasks assessed  Comprehension  Auditory Comprehension Overall Auditory Comprehension: Appears within functional limits for tasks assessed    Expression Expression Primary Mode of Expression: Verbal Verbal Expression Overall Verbal Expression: Appears within functional limits for tasks assessed Written Expression Dominant Hand: Right   Oral / Motor  Oral Motor/Sensory  Function Overall Oral Motor/Sensory Function: Within functional limits Motor Speech Overall Motor Speech: Appears within functional limits for tasks assessed            Amil Kale, M.A., CCC-SLP Speech Language Pathology, Acute Rehabilitation Services  Secure Chat preferred [phone]  10/26/2023, 1:55 PM

## 2023-10-26 NOTE — H&P (Signed)
 History and Physical    NYEEM STOKE QIO:[id number] DOB: Jan 05, 1956 DOA: 10/26/2023  Referring MD/NP/PA: EDP PCP:  Patient coming from: Home  Chief Complaint: Right leg numbness  HPI: Hayden Williams is a 67/M with history of type 2 diabetes mellitus, CKD 3B/4, CAD, GI bleed, hypertension presented to the ED with numbness and decreased sensation in his right leg, had mild symptoms involving his right arm as well, presented to the ED 6/18 afternoon and subsequently left due to delays, then [facility] and was sent to [facility] for MRI. ED Course: Vital signs stable, labs noted creatinine of 2.4, glucose 210, MRI brain noted 1. 9 mm acute ischemic nonhemorrhagic left paramedian medullary infarct.  Review of Systems: As per HPI otherwise 14 point review of systems negative.   Past Medical History:  Diagnosis Date   CAD (coronary artery disease)    a. 11/2014 Cath/PCI: LM nl, LAD 50p, 60d, RI 40, LCX small, OM1 40, OM2 40, RCA 95d (2.25x20 Promus Premier DES), EF nl.   CKD (chronic kidney disease), stage III (HCC)    Diabetes mellitus (HCC)    Esophageal ulcer    Essential hypertension    GI bleed 06/2018   a. melena/ABL anemia with EGD on 06/27/18 with healed esophageal ulcer, esophagitis, hiatal hernia.   Hyperlipidemia    Sleep apnea     Past Surgical History:  Procedure Laterality Date   ANKLE SURGERY Right    CARDIAC CATHETERIZATION N/A 12/02/2014   Procedure: Left Heart Cath and Coronary Angiography;  Surgeon: Odie Benne, MD;  Location: [facility] INVASIVE CV LAB;  Service: Cardiovascular;  Laterality: N/A;   CARDIAC CATHETERIZATION N/A 12/02/2014   Procedure: Coronary Stent Intervention;  Surgeon: Odie Benne, MD;  Location: [facility] INVASIVE CV LAB;  Service: Cardiovascular;  Laterality: N/A;   CATARACT EXTRACTION     CORONARY ANGIOPLASTY WITH STENT PLACEMENT     ESOPHAGOGASTRODUODENOSCOPY (EGD) WITH PROPOFOL  N/A 06/27/2018   Procedure:  ESOPHAGOGASTRODUODENOSCOPY (EGD) WITH PROPOFOL ;  Surgeon: Asencion Blacksmith, MD;  Location: [facility] ENDOSCOPY;  Service: Endoscopy;  Laterality: N/A;   EYE SURGERY     INTRAOCULAR PROSTHESES INSERTION       reports that he has never smoked. He has never used smokeless tobacco. He reports that he does not drink alcohol and does not use drugs.  Allergies  Allergen Reactions   Peanut Oil Anaphylaxis and Swelling   Peanut-Containing Drug Products Anaphylaxis and Swelling   Lisinopril Other (See Comments) and Cough    Increased BUN and creatinine, also     Niacin Other (See Comments)    Reaction not recalled     Family History  Problem Relation Age of Onset   Diabetes Mother    Heart disease Mother    Sleep apnea Mother    Cancer Father    Diabetes Father    Diabetes Sister    Cancer Brother    Diabetes Brother      Prior to Admission medications   Medication Sig Start Date End Date Taking? Authorizing Provider  allopurinol (ZYLOPRIM) 100 MG tablet Take 100 mg by mouth 2 (two) times daily. 09/03/21   [provider]  amLODipine  (NORVASC ) 10 MG tablet Take 10 mg by mouth at bedtime.  02/23/15   [provider]  Ascorbic Acid (VITAMIN C ) 500 MG CAPS Take 500 mg by mouth daily.    [provider]  atorvastatin  (LIPITOR ) 80 MG tablet TAKE 1 TABLET BY MOUTH DAILY 06/02/23  Odie Benne, MD  Cholecalciferol (VITAMIN D) 2000 UNITS CAPS Take 2,000 Units by mouth daily.    [provider]  clopidogrel  (PLAVIX ) 75 MG tablet Take 1 tablet (75 mg total) by mouth daily. 07/18/23   [facility], Warren Haber, PA  Continuous Blood Gluc Sensor (DEXCOM G6 SENSOR) MISC 1 patch See admin instructions. Place 1 new sensor every 10 days to obtain continuous glucose readings 02/01/18   [provider]  Dulaglutide (TRULICITY) 1.5 MG/0.5ML SOPN Inject 1.5 mg into the skin once a week. 01/30/19   [provider]  insulin  glargine, 1 Unit Dial, (TOUJEO ) 300  UNIT/ML Solostar Pen Inject 44 Units into the skin daily. 01/11/19   [provider]  Iron-Vitamin C  65-125 MG TABS Take 1 tablet by mouth daily.    [provider]  ketoconazole (NIZORAL) 2 % cream Apply 1 application topically as needed (to right foot several times a week for irritation).     [provider]  metoprolol  tartrate (LOPRESSOR ) 100 MG tablet TAKE 1 TABLET BY MOUTH 2 TIMES A DAY 06/06/23   Odie Benne, MD  MITIGARE  0.6 MG CAPS Take 1 capsule by mouth daily as needed (for gout flares). 12/09/14   [provider]  nitroGLYCERIN  (NITROSTAT ) 0.4 MG SL tablet Place 1 tablet (0.4 mg total) under the tongue every 5 (five) minutes as needed for chest pain. 07/18/23   [facility], Warren Haber, PA  NOVOLOG  FLEXPEN 100 UNIT/ML FlexPen Inject 2-11 Units into the skin See admin instructions. Inject 2-11 units into the skin two times a day with meals, per sliding scale 06/26/16   [provider]  pantoprazole  (PROTONIX ) 40 MG tablet Take 1 tablet (40 mg total) by mouth daily. 05/18/21   Odie Benne, MD  Podiatric Products (GOLD BOND FOOT) CREA Apply 1 application topically daily as needed (cracked feet).     [provider]  valsartan -hydrochlorothiazide  (DIOVAN -HCT) 160-25 MG tablet Take 1 tablet by mouth daily. 05/18/21   Odie Benne, MD    Physical Exam: Vitals:   10/26/23 0500 10/26/23 0603 10/26/23 0642 10/26/23 1030  BP: 129/87 138/87 127/84 (!) 161/102  Pulse: 65 66 75 78  Resp:  18 15 16   Temp:  97.6 F (36.4 C) 98.4 F (36.9 C) 97.8 F (36.6 C)  TempSrc:    Oral  SpO2: 93% 98% 97% 100%  Weight:      Height:          Constitutional: NAD, calm, comfortable, AAO x 3 HEENT:no JVD Respiratory: clear to auscultation bilaterally Cardiovascular: S1S2/RRR Abdomen: soft, non tender, Bowel sounds positive.  Musculoskeletal: No joint deformity upper and lower extremities. Ext: no edema Neurologic: Mild right  leg numbness, no other focal findings noted Psychiatric: Normal judgment and insight. Alert and oriented x 3. Normal mood.   Labs on Admission: I have personally reviewed following labs and imaging studies  CBC: Recent Labs  Lab 10/25/23 1704 10/25/23 1709  WBC  --  4.9  NEUTROABS  --  3.0  HGB 12.6* 13.3  HCT 37.0* 37.3*  MCV  --  89.7  PLT  --  246   Basic Metabolic Panel: Recent Labs  Lab 10/25/23 1704 10/25/23 1709  NA 141 138  K 4.1 4.1  CL 104 105  CO2  --  23  GLUCOSE 206* 210*  BUN 38* 36*  CREATININE 2.70* 2.49*  CALCIUM   --  8.7*   GFR: Estimated Creatinine Clearance: 34.3 mL/min (A) (by C-G formula  based on SCr of 2.49 mg/dL (H)). Liver Function Tests: Recent Labs  Lab 10/25/23 1709  AST 30  ALT 40  ALKPHOS 76  BILITOT 0.8  PROT 7.0  ALBUMIN 3.5   No results for input(s): LIPASE, AMYLASE in the last 168 hours. No results for input(s): AMMONIA in the last 168 hours. Coagulation Profile: Recent Labs  Lab 10/25/23 1709  INR 1.0   Cardiac Enzymes: No results for input(s): CKTOTAL, CKMB, CKMBINDEX, TROPONINI in the last 168 hours. BNP (last 3 results) No results for input(s): PROBNP in the last 8760 hours. HbA1C: No results for input(s): HGBA1C in the last 72 hours. CBG: No results for input(s): GLUCAP in the last 168 hours. Lipid Profile: No results for input(s): CHOL, HDL, LDLCALC, TRIG, CHOLHDL, LDLDIRECT in the last 72 hours. Thyroid Function Tests: No results for input(s): TSH, T4TOTAL, FREET4, T3FREE, THYROIDAB in the last 72 hours. Anemia Panel: No results for input(s): VITAMINB12, FOLATE, FERRITIN, TIBC, IRON, RETICCTPCT in the last 72 hours. Urine analysis:    Component Value Date/Time   COLORURINE YELLOW 12/02/2014 1219   APPEARANCEUR CLEAR 12/02/2014 1219   LABSPEC 1.026 12/02/2014 1219   PHURINE 5.5 12/02/2014 1219   GLUCOSEU 250 (A) 12/02/2014 1219   HGBUR MODERATE (A)  12/02/2014 1219   BILIRUBINUR NEGATIVE 12/02/2014 1219   KETONESUR NEGATIVE 12/02/2014 1219   PROTEINUR >300 (A) 12/02/2014 1219   UROBILINOGEN 0.2 12/02/2014 1219   NITRITE NEGATIVE 12/02/2014 1219   LEUKOCYTESUR NEGATIVE 12/02/2014 1219   Sepsis Labs: @LABRCNTIP (procalcitonin:4,lacticidven:4) )No results found for this or any previous visit (from the past 240 hours).   Radiological Exams on Admission: MR Cervical Spine Wo Contrast Result Date: 10/26/2023 CLINICAL DATA:  Initial evaluation for acute myelopathy, right-sided numbness. EXAM: MRI CERVICAL SPINE WITHOUT CONTRAST TECHNIQUE: Multiplanar, multisequence MR imaging of the cervical spine was performed. No intravenous contrast was administered. COMPARISON:  Prior radiograph from 11/05/2008. FINDINGS: Alignment: Straightening of the normal cervical lordosis. No listhesis. Vertebrae: Vertebral body height maintained without acute or chronic fracture. Bone marrow signal intensity overall within normal limits. No discrete or worrisome osseous lesions. No abnormal marrow edema. Cord: Normal signal and morphology. Posterior Fossa, vertebral arteries, paraspinal tissues: Unremarkable. Disc levels: C2-C3: Minimal endplate spurring without significant disc bulge. No spinal stenosis. Foramina remain patent. C3-C4: Small central disc protrusion with annular fissure minimally indents the ventral thecal sac. No significant spinal stenosis. Foramina remain patent. C4-C5: Small central disc protrusion minimally indents the ventral thecal sac. Mild bilateral uncovertebral spurring. No significant spinal stenosis. Foramina remain adequately patent. C5-C6: Mild degenerative intervertebral disc space narrowing with diffuse disc bulge and bilateral uncovertebral spurring. Superimposed small central disc protrusion indents the ventral thecal sac. No significant spinal stenosis mild to moderate bilateral C6 foraminal narrowing. C6-C7: Mild degenerative intervertebral  disc space narrowing with diffuse disc osteophyte complex. Mild flattening of the ventral thecal sac without significant spinal stenosis. Mild to moderate left with mild right C7 foraminal stenosis. C7-T1: Minimal disc bulge with uncovertebral spurring. Mild bilateral facet hypertrophy. No spinal stenosis. Foramina remain patent. IMPRESSION: 1. Normal MRI appearance of the cervical spinal cord. No cord signal changes to suggest myelopathy. 2. Mild multilevel cervical spondylosis without significant spinal stenosis. Mild to moderate bilateral C6 and left C7 foraminal narrowing related to disc bulge and uncovertebral disease. Electronically Signed   By: Virgia Griffins M.D.   On: 10/26/2023 08:21   MR BRAIN WO CONTRAST Result Date: 10/26/2023 CLINICAL DATA:  Initial evaluation for acute neuro deficit, stroke  suspected. Acute right-sided numbness. EXAM: MRI HEAD WITHOUT CONTRAST MRA HEAD WITHOUT CONTRAST TECHNIQUE: Multiplanar, multi-echo pulse sequences of the brain and surrounding structures were acquired without intravenous contrast. Angiographic images of the Circle of Willis were acquired using MRA technique without intravenous contrast. COMPARISON:  CT from earlier the same day. FINDINGS: MRI HEAD FINDINGS Brain: Cerebral volume within normal limits for age. Patchy T2/FLAIR hyperintensity involving the periventricular deep white matter both cerebral hemispheres, most characteristic of chronic microvascular ischemic disease, mild for age. 9 mm linear focus of restricted diffusion involving the left paramedian medulla, consistent with an acute ischemic nonhemorrhagic infarct (series 5, image 46). No other evidence for acute or subacute ischemia. Gray-white matter differentiation otherwise maintained. No other areas of chronic cortical infarction. No acute or chronic intracranial blood products. No mass lesion, midline shift or mass effect. No hydrocephalus or extra-axial fluid collection. Pituitary gland  within normal limits. Vascular: Major intracranial vascular flow voids are maintained. Skull and upper cervical spine: Craniocervical junction within normal limits. Bone marrow signal intensity normal. No scalp soft tissue abnormality. Sinuses/Orbits: Prior ocular lens replacement on the right. Globes and orbital soft tissues demonstrate no acute finding. Paranasal sinuses are largely clear. No mastoid effusion. Other: None. MRA HEAD FINDINGS Anterior circulation: Both internal carotid arteries widely patent to the termini without stenosis. A1 segments widely patent. Normal anterior communicating artery complex. Both anterior cerebral arteries widely patent to their distal aspects without stenosis. No M1 stenosis or occlusion. Normal MCA bifurcations. Distal MCA branches well perfused and symmetric. Posterior circulation: Visualized distal V4 segments widely patent without stenosis. Vertebral arteries largely codominant. Neither PICA origin visualized. Basilar patent without stenosis. Superior cerebellar and posterior cerebral arteries patent bilaterally. Anatomic variants: None significant.  No intracranial aneurysm. IMPRESSION: MRI HEAD: 1. 9 mm acute ischemic nonhemorrhagic left paramedian medullary infarct. 2. Underlying mild chronic microvascular ischemic disease for age. MRA HEAD: Negative intracranial MRA. No large vessel occlusion, hemodynamically significant stenosis, or other acute vascular abnormality. Electronically Signed   By: Virgia Griffins M.D.   On: 10/26/2023 08:16   MR ANGIO HEAD WO CONTRAST Result Date: 10/26/2023 CLINICAL DATA:  Initial evaluation for acute neuro deficit, stroke suspected. Acute right-sided numbness. EXAM: MRI HEAD WITHOUT CONTRAST MRA HEAD WITHOUT CONTRAST TECHNIQUE: Multiplanar, multi-echo pulse sequences of the brain and surrounding structures were acquired without intravenous contrast. Angiographic images of the Circle of Willis were acquired using MRA technique  without intravenous contrast. COMPARISON:  CT from earlier the same day. FINDINGS: MRI HEAD FINDINGS Brain: Cerebral volume within normal limits for age. Patchy T2/FLAIR hyperintensity involving the periventricular deep white matter both cerebral hemispheres, most characteristic of chronic microvascular ischemic disease, mild for age. 9 mm linear focus of restricted diffusion involving the left paramedian medulla, consistent with an acute ischemic nonhemorrhagic infarct (series 5, image 46). No other evidence for acute or subacute ischemia. Gray-white matter differentiation otherwise maintained. No other areas of chronic cortical infarction. No acute or chronic intracranial blood products. No mass lesion, midline shift or mass effect. No hydrocephalus or extra-axial fluid collection. Pituitary gland within normal limits. Vascular: Major intracranial vascular flow voids are maintained. Skull and upper cervical spine: Craniocervical junction within normal limits. Bone marrow signal intensity normal. No scalp soft tissue abnormality. Sinuses/Orbits: Prior ocular lens replacement on the right. Globes and orbital soft tissues demonstrate no acute finding. Paranasal sinuses are largely clear. No mastoid effusion. Other: None. MRA HEAD FINDINGS Anterior circulation: Both internal carotid arteries widely patent to the termini without  stenosis. A1 segments widely patent. Normal anterior communicating artery complex. Both anterior cerebral arteries widely patent to their distal aspects without stenosis. No M1 stenosis or occlusion. Normal MCA bifurcations. Distal MCA branches well perfused and symmetric. Posterior circulation: Visualized distal V4 segments widely patent without stenosis. Vertebral arteries largely codominant. Neither PICA origin visualized. Basilar patent without stenosis. Superior cerebellar and posterior cerebral arteries patent bilaterally. Anatomic variants: None significant.  No intracranial aneurysm.  IMPRESSION: MRI HEAD: 1. 9 mm acute ischemic nonhemorrhagic left paramedian medullary infarct. 2. Underlying mild chronic microvascular ischemic disease for age. MRA HEAD: Negative intracranial MRA. No large vessel occlusion, hemodynamically significant stenosis, or other acute vascular abnormality. Electronically Signed   By: Virgia Griffins M.D.   On: 10/26/2023 08:16   CT HEAD WO CONTRAST Result Date: 10/25/2023 CLINICAL DATA:  Numbness to the entire right leg and foot. EXAM: CT HEAD WITHOUT CONTRAST TECHNIQUE: Contiguous axial images were obtained from the base of the skull through the vertex without intravenous contrast. RADIATION DOSE REDUCTION: This exam was performed according to the departmental dose-optimization program which includes automated exposure control, adjustment of the mA and/or kV according to patient size and/or use of iterative reconstruction technique. COMPARISON:  None Available. FINDINGS: Brain: There is generalized cerebral atrophy with widening of the extra-axial spaces and ventricular dilatation. There are areas of decreased attenuation within the white matter tracts of the supratentorial brain, consistent with microvascular disease changes. Vascular: Mild to moderate severity calcification of the bilateral cavernous carotid arteries is noted. Skull: Normal. Negative for fracture or focal lesion. Sinuses/Orbits: No acute finding. Other: None. IMPRESSION: No acute intracranial abnormality. Electronically Signed   By: Virgle Grime M.D.   On: 10/25/2023 17:55    EKG: Independently reviewed.  RBBB, Q waves in inferior leads  Assessment/Plan   Acute CVA (cerebral vascular accident) (HCC) - MRI noted 1. 9 mm acute ischemic nonhemorrhagic left paramedian medullary infarct. - Teleneurology consulting, started aspirin  81 Mg daily, continue Plavix  75 Mg daily - MRA without large vessel occlusion - Check echo, carotid duplex - Check A1c, LDL - PT OT eval    Type II  diabetes mellitus with renal manifestations (HCC) -Follow-up A1c, resume long-acting insulin  at a lower dose, meal coverage    CAD (coronary artery disease) -Stable, continue metoprolol , Plavix , statin    Essential hypertension -Hold valsartan , amlodipine , continue metoprolol     CKD (chronic kidney disease) stage 4, GFR 15-29 ml/min (HCC) -Creatinine largely stable from baseline   DVT prophylaxis: lovenox Code Status: Full  Family Communication: daughter at bedside Disposition Plan: inpt  Deforest Fast MD [facility]   10/26/2023, 11:59 AM

## 2023-10-26 NOTE — ED Triage Notes (Signed)
 Pt arrives via POV from [facility] for MRI. Reports ongoing numbness to RLE. No additional changes.

## 2023-10-26 NOTE — ED Notes (Signed)
 Gave report to Bridgette Campus, Consulting civil engineer at [facility] ED via [employer]

## 2023-10-26 NOTE — ED Triage Notes (Signed)
 Pt coming in with reports of numbness radiating from buttocks down to foot on right side 1 day ago. Felt like leg went to sleep. Numbness lasted throughout the night and was there when he woke up in the morning. Pt reports right arm feels a little bit numb as well. Pt seen at [facility] earlier tonight for these symptoms and has bloodwork, urine, imaging done. Pt reports waiting for 12 hrs and chose to come here instead. No confusion, slurred speech, facial droop. Besides numbness, all other neuro assessment is normal.

## 2023-10-27 ENCOUNTER — Other Ambulatory Visit (HOSPITAL_COMMUNITY): Payer: Self-pay

## 2023-10-27 DIAGNOSIS — I639 Cerebral infarction, unspecified: Secondary | ICD-10-CM | POA: Diagnosis not present

## 2023-10-27 LAB — GLUCOSE, CAPILLARY
Glucose-Capillary: 146 mg/dL — ABNORMAL HIGH (ref 70–99)
Glucose-Capillary: 66 mg/dL — ABNORMAL LOW (ref 70–99)
Glucose-Capillary: 69 mg/dL — ABNORMAL LOW (ref 70–99)
Glucose-Capillary: 168 mg/dL — ABNORMAL HIGH (ref 70–99)
Glucose-Capillary: 155 mg/dL — ABNORMAL HIGH (ref 70–99)
Glucose-Capillary: 190 mg/dL — ABNORMAL HIGH (ref 70–99)

## 2023-10-27 LAB — LIPID PANEL
Cholesterol: 106 mg/dL (ref 0–200)
HDL: 27 mg/dL — ABNORMAL LOW (ref >40)
LDL Cholesterol: 53 mg/dL (ref 0–99)
Total CHOL/HDL Ratio: 3.9 ratio
Triglycerides: 128 mg/dL (ref <150)
VLDL: 26 mg/dL (ref 0–40)

## 2023-10-27 LAB — HEMOGLOBIN A1C
Hgb A1c MFr Bld: 7.7 % — ABNORMAL HIGH (ref 4.8–5.6)
Mean Plasma Glucose: 174.29 mg/dL

## 2023-10-27 MED ORDER — STROKE: EARLY STAGES OF RECOVERY BOOK
Status: AC
  Filled 2023-10-27: qty 1

## 2023-10-27 MED ORDER — TICAGRELOR 90 MG PO TABS
90.0000 mg | ORAL_TABLET | Freq: Two times a day (BID) | ORAL | 0 refills | Status: AC
  Filled 2023-10-27: qty 56, 28d supply, fill #0

## 2023-10-27 MED ORDER — INSULIN GLARGINE-YFGN 100 UNIT/ML ~~LOC~~ SOLN
40.0000 [IU] | SUBCUTANEOUS | Status: DC
  Administered 2023-10-27: 40 [IU] via SUBCUTANEOUS
  Filled 2023-10-27: qty 0.4

## 2023-10-27 MED ORDER — GLUCOSE 4 G PO CHEW
CHEWABLE_TABLET | ORAL | Status: AC
  Filled 2023-10-27: qty 1

## 2023-10-27 MED ORDER — EZETIMIBE 10 MG PO TABS
10.0000 mg | ORAL_TABLET | Freq: Every day | ORAL | 1 refills | Status: AC
  Filled 2023-10-27: qty 30, 30d supply, fill #0

## 2023-10-27 MED ORDER — ASPIRIN 81 MG PO TBEC: 81.0000 mg | DELAYED_RELEASE_TABLET | Freq: Every day | ORAL | Status: AC

## 2023-10-27 MED ORDER — ORAL CARE MOUTH RINSE: 15.0000 mL | OROMUCOSAL | Status: DC | PRN

## 2023-10-27 MED ORDER — CLOPIDOGREL BISULFATE 75 MG PO TABS: 75.0000 mg | ORAL_TABLET | Freq: Every day | ORAL | Status: AC

## 2023-10-27 NOTE — Discharge Summary (Addendum)
 Physician Discharge Summary  Hayden Williams ION:[id number] DOB: November 01, 1955 DOA: 10/26/2023  PCP: Lazoff, Shawn P, DO  Admit date: 10/26/2023 Discharge date: 10/27/2023  Time spent: 45 minutes  Recommendations for Outpatient Follow-up:  [facility] in 2 months Continue Plavix  and Brilinta for 4 weeks, then DC both antiplatelets and start aspirin  81 Mg daily alone   Discharge Diagnoses:  Principal Problem:   CVA (cerebral vascular accident) (HCC) Active Problems:   Type II diabetes mellitus with renal manifestations (HCC)   CAD (coronary artery disease)   Essential hypertension   CKD (chronic kidney disease) stage 4, GFR 15-29 ml/min (HCC)   Type 2 diabetes mellitus with chronic kidney disease, with long-term current use of insulin  (HCC)   Stroke [facility])   Discharge Condition: Improved  Diet recommendation: Diabetic, low-sodium, heart healthy  Filed Weights   10/26/23 0216  Weight: 97.5 kg    History of present illness:  68/M with history of type 2 diabetes mellitus, CKD 3B/4, CAD, GI bleed, hypertension presented to the ED with numbness and decreased sensation in his right leg, had mild symptoms involving his right arm as well, presented to the ED 6/18 afternoon and subsequently left due to delays, then went to [facility] and was sent to [facility] for MRI. ED Course: Vital signs stable, labs noted creatinine of 2.4, glucose 210, MRI brain noted 1. 9 mm acute ischemic nonhemorrhagic left paramedian medullary infarct  Hospital Course:   Acute CVA (cerebral vascular accident) (HCC) - MRI noted 1. 9 mm acute ischemic nonhemorrhagic left paramedian medullary infarct. - MRA without large vessel occlusion - 2D echo largely unremarkable, carotid duplex remarkable - Hemoglobin A1c is 7.7, LDL is 53 -Seen by Dr. Janett Medin with stroke team, recommended Plavix  and Brilinta for 4 weeks followed by aspirin  alone, he also recommended starting Zetia 10mg  and continuing lipitor  80mh -  Follow-up with [facility] in 2 months     Type II diabetes mellitus with renal manifestations (HCC) -A1c 7.7, resume long-acting insulin       CAD (coronary artery disease) -Stable, continue metoprolol , Plavix , statin     Essential hypertension - Resume home meds     CKD (chronic kidney disease) stage 4, GFR 15-29 ml/min (HCC) -Creatinine largely stable from baseline  Consultations: Neuro  Discharge Exam: Vitals:   10/27/23 0517 10/27/23 0528  BP: 111/78 111/78  Pulse: 79 65  Resp: 18 18  Temp: 98.3 F (36.8 C) 98.3 F (36.8 C)  SpO2: 100% 100%   Gen: Awake, Alert, Oriented X 3,  HEENT: no JVD Lungs: Good air movement bilaterally, CTAB CVS: S1S2/RRR Abd: soft, Non tender, non distended, BS present Extremities: No edema Skin: no new rashes on exposed skin   Discharge Instructions    Allergies as of 10/27/2023       Reactions   Peanut (diagnostic) Anaphylaxis, Swelling   Peanut Oil Anaphylaxis, Swelling   Niaspan [niacin] Other (See Comments)   Unknown reaction    Zestril [lisinopril] Other (See Comments), Cough   Increased BUN and creatinine        Medication List     TAKE these medications    allopurinol 100 MG tablet Commonly known as: ZYLOPRIM Take 100 mg by mouth 2 (two) times daily.   amLODipine  10 MG tablet Commonly known as: NORVASC  Take 10 mg by mouth every evening.   aspirin  EC 81 MG tablet Take 1 tablet (81 mg total) by mouth daily. To start in 4 weeks once Plavix  and Brillinta course  completed Start taking on: November 24, 2023   atorvastatin  80 MG tablet Commonly known as: LIPITOR  TAKE 1 TABLET BY MOUTH DAILY   clopidogrel  75 MG tablet Commonly known as: PLAVIX  Take 1 tablet (75 mg total) by mouth daily for 28 days. FOr 4 weeks then STOP What changed: additional instructions   ezetimibe 10 MG tablet Commonly known as: Zetia Take 1 tablet (10 mg total) by mouth daily.   insulin  glargine (1 Unit Dial) 300 UNIT/ML Solostar  Pen Commonly known as: TOUJEO  Inject 44 Units into the skin daily.   IRON-VITAMIN C  PO Take 1 tablet by mouth daily.   ketoconazole 2 % cream Commonly known as: NIZORAL Apply 1 application  topically daily as needed for irritation.   metoprolol  tartrate 100 MG tablet Commonly known as: LOPRESSOR  TAKE 1 TABLET BY MOUTH 2 TIMES A DAY   nitroGLYCERIN  0.4 MG SL tablet Commonly known as: NITROSTAT  Place 1 tablet (0.4 mg total) under the tongue every 5 (five) minutes as needed for chest pain.   NovoLOG  FlexPen 100 UNIT/ML FlexPen Generic drug: insulin  aspart Inject 3-10 Units into the skin See admin instructions. Inject 3-10 units three times daily before each meal, per sliding scale: Under 100: 3 units 100 - 150 : 5 units 151 - 200 : 7 units 201 - 250 : 8 units 251 - 300 : 9 units Over 300 : 10 units   pantoprazole  40 MG tablet Commonly known as: PROTONIX  Take 1 tablet (40 mg total) by mouth daily.   tamsulosin 0.4 MG Caps capsule Commonly known as: FLOMAX Take 0.4 mg by mouth daily.   ticagrelor 90 MG Tabs tablet Commonly known as: Brilinta Take 1 tablet (90 mg total) by mouth 2 (two) times daily for 28 days.   Trulicity 4.5 MG/0.5ML Soaj Generic drug: Dulaglutide Inject 4.5 mg into the skin every Monday.   valsartan -hydrochlorothiazide  160-25 MG tablet Commonly known as: DIOVAN -HCT Take 1 tablet by mouth daily.   VITAMIN D-3 PO Take 1 capsule by mouth daily.       Allergies  Allergen Reactions   Peanut (Diagnostic) Anaphylaxis and Swelling   Peanut Oil Anaphylaxis and Swelling   Niaspan [Niacin] Other (See Comments)    Unknown reaction    Zestril [Lisinopril] Other (See Comments) and Cough    Increased BUN and creatinine        The results of significant diagnostics from this hospitalization (including imaging, microbiology, ancillary and laboratory) are listed below for reference.    Significant Diagnostic Studies: VAS US  CAROTID Result Date:  10/26/2023 Carotid Arterial Duplex Study Patient Name:  Zekiah Adonica Ala  Date of Exam:   10/26/2023 Medical Rec #: [id number]       Accession #:    [id number] Date of Birth: 02-03-1956       Patient Gender: M Patient Age:   68 years Exam Location:  [facility] Procedure:      VAS US  CAROTID Referring Phys: Jeanella Milan [facility] --------------------------------------------------------------------------------  Indications:  CVA. Risk Factors: Diabetes, coronary artery disease, prior CVA. Performing Technologist: Vonzell Guerin, RVT  Examination Guidelines: A complete evaluation includes B-mode imaging, spectral Doppler, color Doppler, and power Doppler as needed of all accessible portions of each vessel. Bilateral testing is considered an integral part of a complete examination. Limited examinations for reoccurring indications may be performed as noted.  Right Carotid Findings: +----------+--------+--------+--------+------------------+--------+           PSV cm/sEDV cm/sStenosisPlaque DescriptionComments +----------+--------+--------+--------+------------------+--------+ CCA Prox  121  20                                         +----------+--------+--------+--------+------------------+--------+ CCA Distal93      18                                         +----------+--------+--------+--------+------------------+--------+ ICA Prox  62      20      Normal                             +----------+--------+--------+--------+------------------+--------+ ICA Distal78      25                                         +----------+--------+--------+--------+------------------+--------+ ECA       67                                                 +----------+--------+--------+--------+------------------+--------+ +----------+--------+-------+----------------+-------------------+           PSV cm/sEDV cmsDescribe        Arm Pressure (mmHG)  +----------+--------+-------+----------------+-------------------+ [id number]             Multiphasic, WNL                    +----------+--------+-------+----------------+-------------------+ +---------+--------+--+--------+--+---------+ VertebralPSV cm/s38EDV cm/s10Antegrade +---------+--------+--+--------+--+---------+  Left Carotid Findings: +----------+--------+--------+--------+------------------+--------+           PSV cm/sEDV cm/sStenosisPlaque DescriptionComments +----------+--------+--------+--------+------------------+--------+ CCA Prox  80      11                                         +----------+--------+--------+--------+------------------+--------+ CCA Distal80      17                                         +----------+--------+--------+--------+------------------+--------+ ICA Prox  56      21      Normal                             +----------+--------+--------+--------+------------------+--------+ ICA Distal62      19                                         +----------+--------+--------+--------+------------------+--------+ ECA       76                                                 +----------+--------+--------+--------+------------------+--------+ +----------+--------+--------+----------------+-------------------+           PSV cm/sEDV cm/sDescribe  Arm Pressure (mmHG) +----------+--------+--------+----------------+-------------------+ [id number]             Multiphasic, WNL                    +----------+--------+--------+----------------+-------------------+ +---------+--------+--+--------+--+---------+ VertebralPSV cm/s42EDV cm/s13Antegrade +---------+--------+--+--------+--+---------+   Summary: Right Carotid: There was no evidence of thrombus, dissection, atherosclerotic                plaque or stenosis in the cervical carotid system. Left Carotid: There was no evidence of thrombus, dissection,  atherosclerotic               plaque or stenosis in the cervical carotid system. Vertebrals: Bilateral vertebral arteries demonstrate antegrade flow. *See table(s) above for measurements and observations.  Electronically signed by Runell Countryman on 10/26/2023 at 5:48:34 PM.    Final    ECHOCARDIOGRAM COMPLETE Result Date: 10/26/2023    ECHOCARDIOGRAM REPORT   Patient Name:   Nas OBED SAMEK Date of Exam: 10/26/2023 Medical Rec #:  [id number]      Height:       71.0 in Accession #:    [id number]     Weight:       215.0 lb Date of Birth:  Oct 21, 1955      BSA:          2.174 m Patient Age:    67 years       BP:           150/96 mmHg Patient Gender: M              HR:           81 bpm. Exam Location:  Inpatient Procedure: Cardiac Doppler and Color Doppler (Both Spectral and Color Flow            Doppler were utilized during procedure). Indications:    CAD  History:        Patient has no prior history of Echocardiogram examinations.  Sonographer:    Griselda Lederer Referring Phys: [id number] [facility] IMPRESSIONS  1. Left ventricular ejection fraction, by estimation, is 70 to 75%. The left ventricle has hyperdynamic function. The left ventricle has no regional wall motion abnormalities. There is mild concentric left ventricular hypertrophy. Left ventricular diastolic parameters are consistent with Grade I diastolic dysfunction (impaired relaxation).  2. Right ventricular systolic function is normal. The right ventricular size is normal.  3. The mitral valve is normal in structure. Trivial mitral valve regurgitation. No evidence of mitral stenosis.  4. The aortic valve is normal in structure. Aortic valve regurgitation is trivial. No aortic stenosis is present.  5. The inferior vena cava is normal in size with greater than 50% respiratory variability, suggesting right atrial pressure of 3 mmHg. FINDINGS  Left Ventricle: Left ventricular ejection fraction, by estimation, is 70 to 75%. The left ventricle has hyperdynamic  function. The left ventricle has no regional wall motion abnormalities. The left ventricular internal cavity size was normal in size. There is mild concentric left ventricular hypertrophy. Left ventricular diastolic parameters are consistent with Grade I diastolic dysfunction (impaired relaxation). Right Ventricle: The right ventricular size is normal. No increase in right ventricular wall thickness. Right ventricular systolic function is normal. Left Atrium: Left atrial size was normal in size. Right Atrium: Right atrial size was normal in size. Pericardium: There is no evidence of pericardial effusion. Mitral Valve: The mitral valve is normal in structure. Trivial mitral valve regurgitation. No evidence of mitral valve stenosis. Tricuspid Valve: The tricuspid  valve is normal in structure. Tricuspid valve regurgitation is trivial. No evidence of tricuspid stenosis. Aortic Valve: The aortic valve is normal in structure. Aortic valve regurgitation is trivial. No aortic stenosis is present. Pulmonic Valve: The pulmonic valve was normal in structure. Pulmonic valve regurgitation is trivial. No evidence of pulmonic stenosis. Aorta: The aortic root is normal in size and structure. Venous: The inferior vena cava is normal in size with greater than 50% respiratory variability, suggesting right atrial pressure of 3 mmHg. IAS/Shunts: No atrial level shunt detected by color flow Doppler.  LEFT VENTRICLE PLAX 2D LVIDd:         4.60 cm   Diastology LVIDs:         2.70 cm   LV e' medial:    8.08 cm/s LV PW:         0.90 cm   LV E/e' medial:  6.8 LV IVS:        0.80 cm   LV e' lateral:   7.15 cm/s LVOT diam:     2.40 cm   LV E/e' lateral: 7.7 LVOT Area:     4.52 cm  RIGHT VENTRICLE             IVC RV S prime:     17.10 cm/s  IVC diam: 1.40 cm TAPSE (M-mode): 2.8 cm LEFT ATRIUM             Index LA diam:        3.50 cm 1.61 cm/m LA Vol (A2C):   33.3 ml 15.31 ml/m LA Vol (A4C):   55.0 ml 25.29 ml/m LA Biplane Vol: 44.8 ml 20.60  ml/m   AORTA Ao Root diam: 3.70 cm Ao Asc diam:  2.80 cm MITRAL VALVE MV Area (PHT): 3.83 cm    SHUNTS MV Decel Time: 198 msec    Systemic Diam: 2.40 cm MV E velocity: 55.00 cm/s MV A velocity: 89.30 cm/s MV E/A ratio:  0.62 Jules Oar MD Electronically signed by Jules Oar MD Signature Date/Time: 10/26/2023/3:43:15 PM    Final    MR Cervical Spine Wo Contrast Result Date: 10/26/2023 CLINICAL DATA:  Initial evaluation for acute myelopathy, right-sided numbness. EXAM: MRI CERVICAL SPINE WITHOUT CONTRAST TECHNIQUE: Multiplanar, multisequence MR imaging of the cervical spine was performed. No intravenous contrast was administered. COMPARISON:  Prior radiograph from 11/05/2008. FINDINGS: Alignment: Straightening of the normal cervical lordosis. No listhesis. Vertebrae: Vertebral body height maintained without acute or chronic fracture. Bone marrow signal intensity overall within normal limits. No discrete or worrisome osseous lesions. No abnormal marrow edema. Cord: Normal signal and morphology. Posterior Fossa, vertebral arteries, paraspinal tissues: Unremarkable. Disc levels: C2-C3: Minimal endplate spurring without significant disc bulge. No spinal stenosis. Foramina remain patent. C3-C4: Small central disc protrusion with annular fissure minimally indents the ventral thecal sac. No significant spinal stenosis. Foramina remain patent. C4-C5: Small central disc protrusion minimally indents the ventral thecal sac. Mild bilateral uncovertebral spurring. No significant spinal stenosis. Foramina remain adequately patent. C5-C6: Mild degenerative intervertebral disc space narrowing with diffuse disc bulge and bilateral uncovertebral spurring. Superimposed small central disc protrusion indents the ventral thecal sac. No significant spinal stenosis mild to moderate bilateral C6 foraminal narrowing. C6-C7: Mild degenerative intervertebral disc space narrowing with diffuse disc osteophyte complex. Mild  flattening of the ventral thecal sac without significant spinal stenosis. Mild to moderate left with mild right C7 foraminal stenosis. C7-T1: Minimal disc bulge with uncovertebral spurring. Mild bilateral facet hypertrophy. No spinal stenosis. Foramina remain patent. IMPRESSION:  1. Normal MRI appearance of the cervical spinal cord. No cord signal changes to suggest myelopathy. 2. Mild multilevel cervical spondylosis without significant spinal stenosis. Mild to moderate bilateral C6 and left C7 foraminal narrowing related to disc bulge and uncovertebral disease. Electronically Signed   By: Virgia Griffins M.D.   On: 10/26/2023 08:21   MR BRAIN WO CONTRAST Result Date: 10/26/2023 CLINICAL DATA:  Initial evaluation for acute neuro deficit, stroke suspected. Acute right-sided numbness. EXAM: MRI HEAD WITHOUT CONTRAST MRA HEAD WITHOUT CONTRAST TECHNIQUE: Multiplanar, multi-echo pulse sequences of the brain and surrounding structures were acquired without intravenous contrast. Angiographic images of the Circle of Willis were acquired using MRA technique without intravenous contrast. COMPARISON:  CT from earlier the same day. FINDINGS: MRI HEAD FINDINGS Brain: Cerebral volume within normal limits for age. Patchy T2/FLAIR hyperintensity involving the periventricular deep white matter both cerebral hemispheres, most characteristic of chronic microvascular ischemic disease, mild for age. 9 mm linear focus of restricted diffusion involving the left paramedian medulla, consistent with an acute ischemic nonhemorrhagic infarct (series 5, image 46). No other evidence for acute or subacute ischemia. Gray-white matter differentiation otherwise maintained. No other areas of chronic cortical infarction. No acute or chronic intracranial blood products. No mass lesion, midline shift or mass effect. No hydrocephalus or extra-axial fluid collection. Pituitary gland within normal limits. Vascular: Major intracranial vascular flow  voids are maintained. Skull and upper cervical spine: Craniocervical junction within normal limits. Bone marrow signal intensity normal. No scalp soft tissue abnormality. Sinuses/Orbits: Prior ocular lens replacement on the right. Globes and orbital soft tissues demonstrate no acute finding. Paranasal sinuses are largely clear. No mastoid effusion. Other: None. MRA HEAD FINDINGS Anterior circulation: Both internal carotid arteries widely patent to the termini without stenosis. A1 segments widely patent. Normal anterior communicating artery complex. Both anterior cerebral arteries widely patent to their distal aspects without stenosis. No M1 stenosis or occlusion. Normal MCA bifurcations. Distal MCA branches well perfused and symmetric. Posterior circulation: Visualized distal V4 segments widely patent without stenosis. Vertebral arteries largely codominant. Neither PICA origin visualized. Basilar patent without stenosis. Superior cerebellar and posterior cerebral arteries patent bilaterally. Anatomic variants: None significant.  No intracranial aneurysm. IMPRESSION: MRI HEAD: 1. 9 mm acute ischemic nonhemorrhagic left paramedian medullary infarct. 2. Underlying mild chronic microvascular ischemic disease for age. MRA HEAD: Negative intracranial MRA. No large vessel occlusion, hemodynamically significant stenosis, or other acute vascular abnormality. Electronically Signed   By: Virgia Griffins M.D.   On: 10/26/2023 08:16   MR ANGIO HEAD WO CONTRAST Result Date: 10/26/2023 CLINICAL DATA:  Initial evaluation for acute neuro deficit, stroke suspected. Acute right-sided numbness. EXAM: MRI HEAD WITHOUT CONTRAST MRA HEAD WITHOUT CONTRAST TECHNIQUE: Multiplanar, multi-echo pulse sequences of the brain and surrounding structures were acquired without intravenous contrast. Angiographic images of the Circle of Willis were acquired using MRA technique without intravenous contrast. COMPARISON:  CT from earlier the same  day. FINDINGS: MRI HEAD FINDINGS Brain: Cerebral volume within normal limits for age. Patchy T2/FLAIR hyperintensity involving the periventricular deep white matter both cerebral hemispheres, most characteristic of chronic microvascular ischemic disease, mild for age. 9 mm linear focus of restricted diffusion involving the left paramedian medulla, consistent with an acute ischemic nonhemorrhagic infarct (series 5, image 46). No other evidence for acute or subacute ischemia. Gray-white matter differentiation otherwise maintained. No other areas of chronic cortical infarction. No acute or chronic intracranial blood products. No mass lesion, midline shift or mass effect. No hydrocephalus or extra-axial fluid collection.  Pituitary gland within normal limits. Vascular: Major intracranial vascular flow voids are maintained. Skull and upper cervical spine: Craniocervical junction within normal limits. Bone marrow signal intensity normal. No scalp soft tissue abnormality. Sinuses/Orbits: Prior ocular lens replacement on the right. Globes and orbital soft tissues demonstrate no acute finding. Paranasal sinuses are largely clear. No mastoid effusion. Other: None. MRA HEAD FINDINGS Anterior circulation: Both internal carotid arteries widely patent to the termini without stenosis. A1 segments widely patent. Normal anterior communicating artery complex. Both anterior cerebral arteries widely patent to their distal aspects without stenosis. No M1 stenosis or occlusion. Normal MCA bifurcations. Distal MCA branches well perfused and symmetric. Posterior circulation: Visualized distal V4 segments widely patent without stenosis. Vertebral arteries largely codominant. Neither PICA origin visualized. Basilar patent without stenosis. Superior cerebellar and posterior cerebral arteries patent bilaterally. Anatomic variants: None significant.  No intracranial aneurysm. IMPRESSION: MRI HEAD: 1. 9 mm acute ischemic nonhemorrhagic left  paramedian medullary infarct. 2. Underlying mild chronic microvascular ischemic disease for age. MRA HEAD: Negative intracranial MRA. No large vessel occlusion, hemodynamically significant stenosis, or other acute vascular abnormality. Electronically Signed   By: Virgia Griffins M.D.   On: 10/26/2023 08:16   CT HEAD WO CONTRAST Result Date: 10/25/2023 CLINICAL DATA:  Numbness to the entire right leg and foot. EXAM: CT HEAD WITHOUT CONTRAST TECHNIQUE: Contiguous axial images were obtained from the base of the skull through the vertex without intravenous contrast. RADIATION DOSE REDUCTION: This exam was performed according to the departmental dose-optimization program which includes automated exposure control, adjustment of the mA and/or kV according to patient size and/or use of iterative reconstruction technique. COMPARISON:  None Available. FINDINGS: Brain: There is generalized cerebral atrophy with widening of the extra-axial spaces and ventricular dilatation. There are areas of decreased attenuation within the white matter tracts of the supratentorial brain, consistent with microvascular disease changes. Vascular: Mild to moderate severity calcification of the bilateral cavernous carotid arteries is noted. Skull: Normal. Negative for fracture or focal lesion. Sinuses/Orbits: No acute finding. Other: None. IMPRESSION: No acute intracranial abnormality. Electronically Signed   By: Virgle Grime M.D.   On: 10/25/2023 17:55    Microbiology: No results found for this or any previous visit (from the past 240 hours).   Labs: Basic Metabolic Panel: Recent Labs  Lab 10/25/23 1704 10/25/23 1709  NA 141 138  K 4.1 4.1  CL 104 105  CO2  --  23  GLUCOSE 206* 210*  BUN 38* 36*  CREATININE 2.70* 2.49*  CALCIUM   --  8.7*   Liver Function Tests: Recent Labs  Lab 10/25/23 1709  AST 30  ALT 40  ALKPHOS 76  BILITOT 0.8  PROT 7.0  ALBUMIN 3.5   No results for input(s): LIPASE, AMYLASE  in the last 168 hours. No results for input(s): AMMONIA in the last 168 hours. CBC: Recent Labs  Lab 10/25/23 1704 10/25/23 1709  WBC  --  4.9  NEUTROABS  --  3.0  HGB 12.6* 13.3  HCT 37.0* 37.3*  MCV  --  89.7  PLT  --  246   Cardiac Enzymes: No results for input(s): CKTOTAL, CKMB, CKMBINDEX, TROPONINI in the last 168 hours. BNP: BNP (last 3 results) No results for input(s): BNP in the last 8760 hours.  ProBNP (last 3 results) No results for input(s): PROBNP in the last 8760 hours.  CBG: Recent Labs  Lab 10/27/23 0800 10/27/23 0816 10/27/23 0936 10/27/23 1138 10/27/23 1244  GLUCAP 66* 69* 168* 155* 190*  Signed:  Deforest Fast MD.  [facility] 10/27/2023, 1:56 PM

## 2023-10-27 NOTE — Progress Notes (Signed)
 At 5 am, pt reported that he has low blood glucose at 55 mg/dl from his self-glucometer application. Pt already ate his snack then 15 minutes later he called an RN to request a can of ginger ale.   We rechecked his CBG from our Glucometer to compare the result. The result was 146 mg/dl after he ate and drink. Pt has concerned it might be from increasing his long acting insulin . We will discuss about adjusting dose of the long acting insulin  with a day shift team.   Pt has normal appetite. Vital signs and neuro signs remain stable with minor decreasing  sensation, tingling and numbness on right side of his body. We will continue to monitor.  Brain Cahill, RN

## 2023-10-27 NOTE — Care Management Obs Status (Signed)
 MEDICARE OBSERVATION STATUS NOTIFICATION   Patient Details  Name: Hayden Williams MRN: [id number] Date of Birth: [birth year]   Medicare Observation Status Notification Given:  Yes    Jonathan Neighbor, RN 10/27/2023, 2:16 PM

## 2023-10-27 NOTE — Inpatient Diabetes Management (Signed)
 Inpatient Diabetes Program Recommendations  AACE/ADA: New Consensus Statement on Inpatient Glycemic Control (2015)  Target Ranges:  Prepandial:   less than 140 mg/dL      Peak postprandial:   less than 180 mg/dL (1-2 hours)      Critically ill patients:  140 - 180 mg/dL   Lab Results  Component Value Date   GLUCAP 190 (H) 10/27/2023   HGBA1C 7.7 (H) 10/27/2023    Review of Glycemic Control  Latest Reference Range & Units 10/26/23 17:13 10/27/23 05:11 10/27/23 08:00 10/27/23 08:16 10/27/23 09:36 10/27/23 11:38 10/27/23 12:44  Glucose-Capillary 70 - 99 mg/dL 098 (H) 119 (H) 66 (L) 69 (L) 168 (H) 155 (H) 190 (H)  (H): Data is abnormally high (L): Data is abnormally low  Diabetes history: DM2 Outpatient Diabetes medications: Toujeo  44 units every day, Novolog  44 units every day, Trulicity 4.5 mg weekly Current orders for Inpatient glycemic control: Semglee  40 units every day, Novolog  3 units TID  Inpatient Diabetes Program Recommendations:    Please consider:  Novolog  0-6 units TID and 0-5 units at bedtime.    Thank you, Hays Lipschutz, MSN, CDCES Diabetes Coordinator Inpatient Diabetes Program [phone] (team pager from 8a-5p)

## 2023-10-27 NOTE — TOC Transition Note (Signed)
 Transition of Care [facility]) - Discharge Note   Patient Details  Name: Hayden Williams MRN: [id number] Date of Birth: 05-19-1955  Transition of Care [facility]) CM/SW Contact:  Jonathan Neighbor, RN Phone Number: 10/27/2023, 2:21 PM   Clinical Narrative:     Pt is discharging home with self care. No needs per therapies.  Pt started on Brilinta. Pt will receive first 30 days through [facility] pharmacy with coupon for free.  Pt has transportation home.  Final next level of care: Home/Self Care Barriers to Discharge: No Barriers Identified   Patient Goals and CMS Choice            Discharge Placement                       Discharge Plan and Services Additional resources added to the After Visit Summary for     Discharge Planning Services: CM Consult                                 Social Drivers of Health (SDOH) Interventions SDOH Screenings   Food Insecurity: No Food Insecurity (10/26/2023)  Housing: Low Risk  (10/26/2023)  Transportation Needs: No Transportation Needs (10/26/2023)  Utilities: Not At Risk (10/26/2023)  Social Connections: Moderately Isolated (10/26/2023)  Tobacco Use: Low Risk  (10/26/2023)     Readmission Risk Interventions     No data to display

## 2023-10-27 NOTE — TOC Initial Note (Addendum)
 Transition of Care [facility]) - Initial/Assessment Note    Patient Details  Name: Hayden Williams MRN: [id number] Date of Birth: 01-Aug-1955  [facility]) CM/SW Contact:    Jonathan Neighbor, RN Phone Number: 10/27/2023, 10:51 AM  Clinical Narrative:                  PCP: Nelly Banco P  Pt is from home alone. He has family that can check on him as needed: Brother, sister, son at the bedside.  No DME at home.  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 Pt manages his own medications and denies any issues.  Awaiting therapy evals.  TOC following.  1111: no follow up per OT but pt has about 13 steps without hand rail. CM has provided him with Aging gracefully/ community housing [facility] information to see if they could install him a hand rail.   Expected Discharge Plan:  (TBD) Barriers to Discharge: Continued Medical Work up   Patient Goals and CMS Choice            Expected Discharge Plan and Services   Discharge Planning Services: CM Consult   Living arrangements for the past 2 months: Single Family Home                                      Prior Living Arrangements/Services Living arrangements for the past 2 months: Single Family Home Lives with:: Self Patient language and need for interpreter reviewed:: Yes Do you feel safe going back to the place where you live?: Yes        Care giver support system in place?: Yes (comment)   Criminal Activity/Legal Involvement Pertinent to Current Situation/Hospitalization: No - Comment as needed  Activities of Daily Living   ADL Screening (condition at time of admission) Independently performs ADLs?: Yes (appropriate for developmental age) Is the patient deaf or have difficulty hearing?: No Does the patient have difficulty seeing, even when wearing glasses/contacts?: No Does the patient have difficulty concentrating, remembering, or making decisions?:  No  Permission Sought/Granted                  Emotional Assessment Appearance:: Appears stated age Attitude/Demeanor/Rapport: Engaged Affect (typically observed): Accepting Orientation: : Oriented to Self, Oriented to Place, Oriented to  Time, Oriented to Situation   Psych Involvement: No (comment)  Admission diagnosis:  Stroke [facility]) [I63.9] Left sided numbness [R20.0] Patient Active Problem List   Diagnosis Date Noted   CVA (cerebral vascular accident) (HCC) 10/26/2023   CKD (chronic kidney disease) stage 4, GFR 15-29 ml/min (HCC) 10/26/2023   Type 2 diabetes mellitus with chronic kidney disease, with long-term current use of insulin  (HCC) 10/26/2023   Stroke (HCC) 10/26/2023   Melena    Iron deficiency anemia    Elevated troponin 06/26/2018   GIB (gastrointestinal bleeding) 06/25/2018   Chest pain 06/25/2018   Symptomatic anemia 06/25/2018   Essential hypertension    CAD (coronary artery disease) 12/03/2014   Acute coronary syndrome (HCC) 12/03/2014   Precordial chest pain 12/02/2014   CKD (chronic kidney disease) stage 3, GFR 30-59 ml/min (HCC) 12/02/2014   Unstable angina (HCC) 12/02/2014   Type II diabetes mellitus with renal manifestations (HCC) 01/13/2013   PCP:  Lazoff, Shawn P, DO Pharmacy:   [facility] - [place], [place] -  [address] [place] [address] Phone: [phone] Fax: [phone]  [facility] [id number] - [place], [place] - [address] [place] [address] Phone: [phone] Fax: [phone]  [facility] [address], [place] - [address] [place] [address] Phone: [phone] Fax: [phone]     Social Drivers of Health (SDOH) Social History: SDOH Screenings   Food Insecurity: No Food Insecurity (10/26/2023)  Housing: Low Risk  (10/26/2023)  Transportation Needs: No Transportation Needs (10/26/2023)   Utilities: Not At Risk (10/26/2023)  Social Connections: Moderately Isolated (10/26/2023)  Tobacco Use: Low Risk  (10/26/2023)   SDOH Interventions:     Readmission Risk Interventions     No data to display

## 2023-10-27 NOTE — Plan of Care (Signed)

## 2023-10-27 NOTE — Evaluation (Addendum)
 Occupational Therapy Evaluation Patient Details Name: Hayden Williams MRN: [id number] DOB: [birth year] Today's Date: 10/27/2023   History of Present Illness   Pt is a 68 y/o male presenting on 6/19 with R leg numbness. MRI with acute 1.31mm nonhemorrhagic L paramedian medullary infarct. PMH includes: CAD, CKD, DM, GI bleed, HTN, sleep apnea.     Clinical Impressions Patient admitted for above and presents at baseline independent level for ADLs, transfers and functional mobility. Cognition, UE sensation, UE coordination, and vision are [facility]. Pt completing dual cognitive tasks without issue.  He does reports some sensation changes remain in R LE, but improving.  Educated on BEFAST stroke signs/symptoms. No further OT needs have been identified, pt has no further questions or concerns, and OT will sign off. Thank you for this referral!       If plan is discharge home, recommend the following:         Functional Status Assessment   Patient has not had a recent decline in their functional status     Equipment Recommendations   None recommended by OT     Recommendations for Other Services         Precautions/Restrictions   Precautions Precautions: None Restrictions Weight Bearing Restrictions Per Provider Order: No     Mobility Bed Mobility Overal bed mobility: Independent                  Transfers Overall transfer level: Independent                        Balance Overall balance assessment: No apparent balance deficits (not formally assessed)                                         ADL either performed or assessed with clinical judgement   ADL Overall ADL's : Independent                                             Vision Baseline Vision/History: 1 Wears glasses Ability to See in Adequate Light: 0 Adequate Patient Visual Report: No change from baseline Vision Assessment?: No apparent visual  deficits Additional Comments: pt with baseline R eye exotropia, reports from cataract as a child. No visual changes from his baseline.     Perception         Praxis         Pertinent Vitals/Pain Pain Assessment Pain Assessment: No/denies pain     Extremity/Trunk Assessment Upper Extremity Assessment Upper Extremity Assessment: Right hand dominant;Overall [facility] for tasks assessed   Lower Extremity Assessment Lower Extremity Assessment: Defer to PT evaluation   Cervical / Trunk Assessment Cervical / Trunk Assessment: Normal   Communication Communication Communication: No apparent difficulties   Cognition Arousal: Alert Behavior During Therapy: WFL for tasks assessed/performed Cognition: No apparent impairments             OT - Cognition Comments: dual tasking and recall without difficulty                 Following commands: Intact       Cueing  General Comments   Cueing Techniques: Verbal cues  family present and supportive   Exercises     Shoulder Instructions  Home Living Family/patient expects to be discharged to:: Private residence Living Arrangements: Alone Available Help at Discharge: Family;Available PRN/intermittently Type of Home: House Home Access: Stairs to enter [employer] of Steps: 9 from driveway, 4 steps front door Entrance Stairs-Rails: None Home Layout: Two level;Bed/bath upstairs;1/2 bath on main level Alternate Level Stairs-Number of Steps: both initally, then R   Bathroom Shower/Tub: Chief Strategy Officer: Standard     Home Equipment: [employer] - single point          Prior Functioning/Environment Prior Level of Function : Independent/Modified Independent;Driving               ADLs Comments: retired Art gallery manager, 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[employer] ([employer] part time)    OT Problem List: Impaired sensation   OT Treatment/Interventions:        OT Goals(Current goals can be found in the care  plan section)   Acute Rehab OT Goals Patient Stated Goal: home OT Goal Formulation: With patient   OT Frequency:       Co-evaluation              AM-PAC OT 6 Clicks Daily Activity     Outcome Measure Help from another person eating meals?: None Help from another person taking care of personal grooming?: None Help from another person toileting, which includes using toliet, bedpan, or urinal?: None Help from another person bathing (including washing, rinsing, drying)?: None Help from another person to put on and taking off regular upper body clothing?: None Help from another person to put on and taking off regular lower body clothing?: None 6 Click Score: 24   End of Session Equipment Utilized During Treatment: Gait belt Nurse Communication: Mobility status  Activity Tolerance: Patient tolerated treatment well Patient left: with call bell/phone within reach;Other (comment) (sitting EOB)  OT Visit Diagnosis: Other symptoms and signs involving the nervous system (N62.952)                Time: 8413-2440 OT Time Calculation (min): 20 min Charges:  OT General Charges $OT Visit: 1 Visit OT Evaluation $OT Eval Low Complexity: 1 Low  Bary Boss, OT Acute Rehabilitation Services Office [phone] Secure Chat Preferred    Fredrich Jefferson 10/27/2023, 11:08 AM

## 2023-10-27 NOTE — Progress Notes (Signed)
 0800 patient called RN to room home monitor reading 61 hospital monitor reading 66 patient refused glucose tabs ate a banana

## 2023-10-27 NOTE — Care Management CC44 (Signed)
 Condition Code 44 Documentation Completed  Patient Details  Name: MELVYN HOMMES MRN: [id number] Date of Birth: 1955/07/13   Condition Code 44 given:  Yes Patient signature on Condition Code 44 notice:  Yes Documentation of 2 MD's agreement:  Yes Code 44 added to claim:  Yes    Jonathan Neighbor, RN 10/27/2023, 2:17 PM

## 2023-10-27 NOTE — Progress Notes (Incomplete)
 STROKE TEAM PROGRESS NOTE    SIGNIFICANT HOSPITAL EVENTS   INTERIM HISTORY/SUBJECTIVE    OBJECTIVE  CBC    Component Value Date/Time   WBC 4.9 10/25/2023 1709   RBC 4.16 (L) 10/25/2023 1709   HGB 13.3 10/25/2023 1709   HCT 37.3 (L) 10/25/2023 1709   PLT 246 10/25/2023 1709   MCV 89.7 10/25/2023 1709   MCV 92.2 01/13/2013 1504   MCH 32.0 10/25/2023 1709   MCHC 35.7 10/25/2023 1709   RDW 12.3 10/25/2023 1709   LYMPHSABS 1.2 10/25/2023 1709   MONOABS 0.5 10/25/2023 1709   EOSABS 0.1 10/25/2023 1709   BASOSABS 0.1 10/25/2023 1709    BMET    Component Value Date/Time   NA 138 10/25/2023 1709   K 4.1 10/25/2023 1709   CL 105 10/25/2023 1709   CO2 23 10/25/2023 1709   GLUCOSE 210 (H) 10/25/2023 1709   BUN 36 (H) 10/25/2023 1709   CREATININE 2.49 (H) 10/25/2023 1709   CREATININE 2.28 (H) 09/11/2015 1009   CALCIUM  8.7 (L) 10/25/2023 1709   GFRNONAA 28 (L) 10/25/2023 1709    IMAGING past 24 hours VAS US  CAROTID Result Date: 10/26/2023 Carotid Arterial Duplex Study Patient Name:  Hayden Williams  Date of Exam:   10/26/2023 Medical Rec #: [id number]       Accession #:    [id number] Date of Birth: June 28, 1955       Patient Gender: M Patient Age:   6 years Exam Location:  [facility] Procedure:      VAS US  CAROTID Referring Phys: Jeanella Milan [facility] --------------------------------------------------------------------------------  Indications:  CVA. Risk Factors: Diabetes, coronary artery disease, prior CVA. Performing Technologist: Vonzell Guerin, RVT  Examination Guidelines: A complete evaluation includes B-mode imaging, spectral Doppler, color Doppler, and power Doppler as needed of all accessible portions of each vessel. Bilateral testing is considered an integral part of a complete examination. Limited examinations for reoccurring indications may be performed as noted.  Right Carotid Findings: +----------+--------+--------+--------+------------------+--------+           PSV  cm/sEDV cm/sStenosisPlaque DescriptionComments +----------+--------+--------+--------+------------------+--------+ CCA Prox  121     20                                         +----------+--------+--------+--------+------------------+--------+ CCA Distal93      18                                         +----------+--------+--------+--------+------------------+--------+ ICA Prox  62      20      Normal                             +----------+--------+--------+--------+------------------+--------+ ICA Distal78      25                                         +----------+--------+--------+--------+------------------+--------+ ECA       67                                                 +----------+--------+--------+--------+------------------+--------+ +----------+--------+-------+----------------+-------------------+  PSV cm/sEDV cmsDescribe        Arm Pressure (mmHG) +----------+--------+-------+----------------+-------------------+ [id number]             Multiphasic, WNL                    +----------+--------+-------+----------------+-------------------+ +---------+--------+--+--------+--+---------+ VertebralPSV cm/s38EDV cm/s10Antegrade +---------+--------+--+--------+--+---------+  Left Carotid Findings: +----------+--------+--------+--------+------------------+--------+           PSV cm/sEDV cm/sStenosisPlaque DescriptionComments +----------+--------+--------+--------+------------------+--------+ CCA Prox  80      11                                         +----------+--------+--------+--------+------------------+--------+ CCA Distal80      17                                         +----------+--------+--------+--------+------------------+--------+ ICA Prox  56      21      Normal                             +----------+--------+--------+--------+------------------+--------+ ICA Distal62      19                                          +----------+--------+--------+--------+------------------+--------+ ECA       76                                                 +----------+--------+--------+--------+------------------+--------+ +----------+--------+--------+----------------+-------------------+           PSV cm/sEDV cm/sDescribe        Arm Pressure (mmHG) +----------+--------+--------+----------------+-------------------+ [id number]             Multiphasic, WNL                    +----------+--------+--------+----------------+-------------------+ +---------+--------+--+--------+--+---------+ VertebralPSV cm/s42EDV cm/s13Antegrade +---------+--------+--+--------+--+---------+   Summary: Right Carotid: There was no evidence of thrombus, dissection, atherosclerotic                plaque or stenosis in the cervical carotid system. Left Carotid: There was no evidence of thrombus, dissection, atherosclerotic               plaque or stenosis in the cervical carotid system. Vertebrals: Bilateral vertebral arteries demonstrate antegrade flow. *See table(s) above for measurements and observations.  Electronically signed by Runell Countryman on 10/26/2023 at 5:48:34 PM.    Final    ECHOCARDIOGRAM COMPLETE Result Date: 10/26/2023    ECHOCARDIOGRAM REPORT   Patient Name:   Hayden Williams Date of Exam: 10/26/2023 Medical Rec #:  [id number]      Height:       71.0 in Accession #:    [id number]     Weight:       215.0 lb Date of Birth:  [birth year]      BSA:          2.174 m Patient Age:    67 years       BP:           150/96 mmHg  Patient Gender: M              HR:           81 bpm. Exam Location:  Inpatient Procedure: Cardiac Doppler and Color Doppler (Both Spectral and Color Flow            Doppler were utilized during procedure). Indications:    CAD  History:        Patient has no prior history of Echocardiogram examinations.  Sonographer:    Griselda Lederer Referring Phys: [id number] [facility] IMPRESSIONS   1. Left ventricular ejection fraction, by estimation, is 70 to 75%. The left ventricle has hyperdynamic function. The left ventricle has no regional wall motion abnormalities. There is mild concentric left ventricular hypertrophy. Left ventricular diastolic parameters are consistent with Grade I diastolic dysfunction (impaired relaxation).  2. Right ventricular systolic function is normal. The right ventricular size is normal.  3. The mitral valve is normal in structure. Trivial mitral valve regurgitation. No evidence of mitral stenosis.  4. The aortic valve is normal in structure. Aortic valve regurgitation is trivial. No aortic stenosis is present.  5. The inferior vena cava is normal in size with greater than 50% respiratory variability, suggesting right atrial pressure of 3 mmHg. FINDINGS  Left Ventricle: Left ventricular ejection fraction, by estimation, is 70 to 75%. The left ventricle has hyperdynamic function. The left ventricle has no regional wall motion abnormalities. The left ventricular internal cavity size was normal in size. There is mild concentric left ventricular hypertrophy. Left ventricular diastolic parameters are consistent with Grade I diastolic dysfunction (impaired relaxation). Right Ventricle: The right ventricular size is normal. No increase in right ventricular wall thickness. Right ventricular systolic function is normal. Left Atrium: Left atrial size was normal in size. Right Atrium: Right atrial size was normal in size. Pericardium: There is no evidence of pericardial effusion. Mitral Valve: The mitral valve is normal in structure. Trivial mitral valve regurgitation. No evidence of mitral valve stenosis. Tricuspid Valve: The tricuspid valve is normal in structure. Tricuspid valve regurgitation is trivial. No evidence of tricuspid stenosis. Aortic Valve: The aortic valve is normal in structure. Aortic valve regurgitation is trivial. No aortic stenosis is present. Pulmonic Valve: The  pulmonic valve was normal in structure. Pulmonic valve regurgitation is trivial. No evidence of pulmonic stenosis. Aorta: The aortic root is normal in size and structure. Venous: The inferior vena cava is normal in size with greater than 50% respiratory variability, suggesting right atrial pressure of 3 mmHg. IAS/Shunts: No atrial level shunt detected by color flow Doppler.  LEFT VENTRICLE PLAX 2D LVIDd:         4.60 cm   Diastology LVIDs:         2.70 cm   LV e' medial:    8.08 cm/s LV PW:         0.90 cm   LV E/e' medial:  6.8 LV IVS:        0.80 cm   LV e' lateral:   7.15 cm/s LVOT diam:     2.40 cm   LV E/e' lateral: 7.7 LVOT Area:     4.52 cm  RIGHT VENTRICLE             IVC RV S prime:     17.10 cm/s  IVC diam: 1.40 cm TAPSE (M-mode): 2.8 cm LEFT ATRIUM             Index LA diam:  3.50 cm 1.61 cm/m LA Vol (A2C):   33.3 ml 15.31 ml/m LA Vol (A4C):   55.0 ml 25.29 ml/m LA Biplane Vol: 44.8 ml 20.60 ml/m   AORTA Ao Root diam: 3.70 cm Ao Asc diam:  2.80 cm MITRAL VALVE MV Area (PHT): 3.83 cm    SHUNTS MV Decel Time: 198 msec    Systemic Diam: 2.40 cm MV E velocity: 55.00 cm/s MV A velocity: 89.30 cm/s MV E/A ratio:  0.62 Jules Oar MD Electronically signed by Jules Oar MD Signature Date/Time: 10/26/2023/3:43:15 PM    Final     Vitals:   10/26/23 2005 10/26/23 2333 10/27/23 0517 10/27/23 0528  BP: 128/88 119/79 111/78 111/78  Pulse: 73 70 79 65  Resp: 17 18 18 18   Temp: 98.2 F (36.8 C) 97.8 F (36.6 C) 98.3 F (36.8 C) 98.3 F (36.8 C)  TempSrc: Oral Oral Oral Oral  SpO2: 94% 96% 100% 100%  Weight:      Height:         PHYSICAL EXAM General:  Alert, well-nourished, well-developed patient in no acute distress Psych:  Mood and affect appropriate for situation CV: Regular rate and rhythm on monitor Respiratory:  Regular, unlabored respirations on room air GI: Abdomen soft and nontender   NEURO:  Mental Status: AA&Ox3, patient is able to give clear and coherent  history Speech/Language: speech is without dysarthria or aphasia.  Naming, repetition, fluency, and comprehension intact.  Cranial Nerves:  II: PERRL. Visual fields full.  III, IV, VI: EOMI. Eyelids elevate symmetrically.  V: Sensation is intact to light touch and symmetrical to face.  VII: Face is symmetrical resting and smiling VIII: hearing intact to voice. IX, X: Palate elevates symmetrically. Phonation is normal.  UU:VOZDGUYQ shrug 5/5. XII: tongue is midline without fasciculations. Motor: 5/5 strength to all muscle groups tested.  Tone: is normal and bulk is normal Sensation- Intact to light touch bilaterally. Extinction absent to light touch to DSS.   Coordination: FTN intact bilaterally, HKS: no ataxia in BLE.No drift.  Gait- deferred  Most Recent NIH ***     ASSESSMENT/PLAN  Mr. Hayden Williams is a 68 y.o. male with history of *** admitted for ***.  NIH on Admission ***  {Neurological Diagnosis:29705}:  {gen right left:310021} *** s/p {Stroke Treatment:29704} Etiology:  ***  Code Stroke ***CT head No acute abnormality. ***Small vessel disease. Atrophy. ***ASPECTS 10. ***   CTA head & neck *** CT perfusion *** Post IR CT *** MRI  *** MRA  *** Carotid Doppler  *** 2D Echo *** LDL 53 HgbA1c 7.7 VTE prophylaxis - *** {anticoagulants:31417} prior to admission, now on {anticoagulants:31417} for *** weeks and then *** alone. Therapy recommendations:  {Therapy Recommendations at Discharge:29703} Disposition:  ***  Hx of Stroke/TIA ***  Atrial fibrillation Home Meds:  Continue telemetry monitoring Begin anticoagulation with ***   Hypertension Home meds:  *** Uns***Stable {Blood Pressure Goal:29586}   Hyperlipidemia Home meds:  ***, ***resumed in hospital LDL 53, goal < 70 Add ***  High intensity statin not indicated *** Continue statin at discharge  Diabetes type II Unc***Controlled Home meds:  *** HgbA1c 7.7, goal < 7.0 CBGs SSI Recommend close  follow-up with PCP for better DM control  Tobacco Abuse Patient smokes *** packs per day for *** years {Ready to Quit?:21018006} Nicotine replacement therapy provided  Substance Abuse Patient uses *** UDS positive for *** THC *** Cocaine {Ready to Quit?:21018006} TOC consult for cessation placed  Dysphagia Patient has post-stroke  dysphagia, SLP consulted    Diet   Diet Carb Modified Fluid consistency: Thin; Room service appropriate? Yes   Advance diet as tolerated  Other Stroke Risk Factors ***ETOH use, alcohol level <15, advised to drink no more than 1***2 drink(s) a day ***Obesity, Body mass index is 29.99 kg/m., BMI >/= 30 associated with increased stroke risk, recommend weight loss, diet and exercise as appropriate  ***Family hx stroke (***) ***Coronary artery disease ***Congestive heart failure ***Obstructive sleep apnea, ***on CPAP at home ***Migraines   Other Active Problems   Hospital day # 1    To contact Stroke Continuity provider, please refer to [email or web address]. After hours, contact General Neurology

## 2023-10-27 NOTE — Progress Notes (Signed)
 10/26/23 2005  Neurological  Neuro (WDL) X  Orientation Level Oriented X4  Cognition Appropriate at baseline;Appropriate attention/concentration;Appropriate judgement;Appropriate safety awareness;Appropriate for developmental age;Follows commands;No memory impairment  Speech Clear;Appropriate for developmental age;Appropriate at baseline  R Pupil Size (mm) 5 (baseline abnormality  from prior cartaract surgery)  R Pupil Shape Irregular  R Pupil Reaction Nonreactive  L Pupil Size (mm) 3  L Pupil Shape Round  L Pupil Reaction Brisk  Motor Function/Sensation Assessment Head;Grip;Elbow extension;Elbow flexion;Pronator drift;Dorsiflexion;Plantar flexion;Arm ataxia;Leg ataxia;Motor response;Sensation;Motor strength  Facial Symmetry Asymmetrical right (Pt stated baseline prior this admission)  Facial Droop Right  R Hand Grip Strong  L Hand Grip Strong  R Elbow Extension (Push/Biceps) Strong  L Elbow Extension (Push/Biceps) Strong  R Elbow Flexion (Pull/Triceps) Strong  L Elbow Flexion (Pull/Triceps) Strong  Right Pronator Drift Absent  Left Pronator Drift Absent  R Foot Dorsiflexion Strong  L Foot Dorsiflexion Strong  R Foot Plantar Flexion Strong  L Foot Plantar Flexion Strong  R Finger to Nose (Point to [employer]) Smooth  L Finger to Nose (Point to [employer]) Smooth  R Heel to [employer] (Point to [employer]) Smooth  L Heel to [employer] to [employer]) Smooth  RUE Motor Response Purposeful movement  RUE Sensation Decreased;Numbness;Tingling  RUE Motor Strength 5  LUE Motor Response Purposeful movement  LUE Sensation Full sensation  LUE Motor Strength 5  RLE Motor Response Purposeful movement  RLE Sensation Numbness;Tingling;Decreased  RLE Motor Strength 5  LLE Motor Response Purposeful movement  LLE Sensation Full sensation  LLE Motor Strength 5  R Sensory Level Other (Comment)  Neuro Symptoms None  Glasgow Coma Scale  Eye Opening 4  Best Verbal Response (NON-intubated) 5  Best Motor Response  6  Glasgow Coma Scale Score 15  NIH Stroke Scale   Dizziness Present No  Headache Present No  Interval Shift assessment  Level of Consciousness (1a.)    0  LOC Questions (1b. )    0  LOC Commands (1c. )    0  Best Gaze (2. )   0  Visual (3. )   0  Facial Palsy (4. )     1 (Pt stated his baseline)  Motor Arm, Left (5a. )    0  Motor Arm, Right (5b. )  0  Motor Leg, Left (6a. )   0  Motor Leg, Right (6b. )  0  Limb Ataxia (7. ) 0  Sensory (8. )   1  Best Language (9. )   0  Dysarthria (10. ) 0  Extinction/Inattention (11.)    0  Complete NIHSS TOTAL 2  NuDESC - Delirium Risk Factor Assessment (Complete for non-ICU patients)  Delirium Risk Factor Assessment Age greater than or equal to 65 years  NuDESC - Nursing Delirium Screening Scale (Complete for non-ICU patients)  Disorientation 0  Inappropriate Behavior 0  Inappropriate Communications 0  Illusions/hallucinations 0  Psychomotor Retardation 0  NuDESC Total Score 0  NuDESC - Delirium Prevention:  Universal Requirements (Complete for all non-ICU patients with a delirium risk factor)  Universal Precautions Initiated *See Row Information* Yes  Oral Assessment (Complete on admission/transfer/every shift)  Oral Assessment  (WDL) WDL  Lips Asymmetrical;Smooth;Pink  Teeth Intact  Tongue Pink  Mucous Membrane(s) Moist  Is patient on any of following O2 devices? None of the above  Nutritional status No high risk factors  HEENT  HEENT (WDL) X  Vision Check Yes  Gross Visual Acuity Blurred - right;Normal - left (baseline)  Extraocular Movement Normal - right;Normal - left  R Eye Impaired vision;Blurred vision;Other (Comment);Eyeglasses (lence abnomallity from past surgery right eye)  L Eye Eyeglasses  Voice Clear  Neurological  Level of Consciousness Alert   Brain Cahill, RN

## 2023-10-27 NOTE — Progress Notes (Signed)
 PT Cancellation Note  Patient Details Name: CHIDIEBERE WYNN MRN: [id number] DOB: Jan 26, 1956   Cancelled Treatment:    Reason Eval/Treat Not Completed: PT screened, no needs identified, will sign off (Per OT, Pt is independent and has no acute PT needs. Pt has DC orders signed. Will sign off)   Lashawnda Hancox 10/27/2023, 2:55 PM

## 2023-10-28 NOTE — Progress Notes (Signed)
 Escorted patient to front entrance pickedup TOC meds on the way out.  Patient transported home via private aurtomobile

## 2023-11-08 ENCOUNTER — Telehealth: Payer: Self-pay | Admitting: Cardiovascular Disease

## 2023-11-08 NOTE — Telephone Encounter (Signed)
  Pt c/o medication issue:  1. Name of Medication:   clopidogrel  (PLAVIX ) 75 MG tablet    2. How are you currently taking this medication (dosage and times per day)?   Take 1 tablet (75 mg total) by mouth daily for 28 days. FOr 4 weeks then STOP    3. Are you having a reaction (difficulty breathing--STAT)? No   4. What is your medication issue? Pt's sister, Olam, called. She said the patient had a stroke recently but is doing fine now. The hospitalist advised the patient to stop taking Plavix  for 28 days, then start taking Brilinta . She wants to get Dr. Santa opinion if this is okay.

## 2023-11-08 NOTE — Telephone Encounter (Signed)
 Patient recently hospitalized for stroke. His sister Hayden Williams would like Dr. Verlin to review hospital notes. She is concerned about the recent medication changes with patient taking Plavix  and Brilinta  and patient's history of having a stent. She wants to make sure Dr. Verlin agrees with the recommendations.

## 2023-11-09 NOTE — Telephone Encounter (Signed)
 Verlin Lonni BIRCH, MD to Me (Selected Message)     11/08/23  1:11 PM He was on Plavix  alone before this admission. Notes state Plavix  and Brilinta  for 4 weeks then can stop. Would stop Brilinta  one month after his discharge but keep taking Plavix . (After July 20th can take Plavix  alone without ASA).  _________________________________________________________    Per Olam instructions were kind of unclear on discharge.  He was not taking asa 81 mg for many years due to GI bleed, but they put him back on asa.  And continue Plavix  and start Brilinta  90 mg and the aspirin  81 mg.  She feels it is a lot and esp w GI bleed history.   He did not start asa per PCP but continues Plavix  and Brilinta  with recommendation to stop Plavix  on July 18.   This was a neuro recommendation.  You had 2 strokes on Plavix  so obviously Plavix  isn't doing you any good.    So she wanted to get Dr. Santa blessing as well w patient's cardiac history that the Brilinta  will be okay for his stent.  I adv her that Brilinta  will be okay.    Will send back to Dr. Verlin to confirm.  They will continue neuro recommendations as long as okay w Dr. Verlin.

## 2023-11-27 ENCOUNTER — Other Ambulatory Visit (HOSPITAL_COMMUNITY): Payer: Self-pay

## 2023-12-04 ENCOUNTER — Ambulatory Visit: Payer: Medicare (Managed Care) | Admitting: Diagnostic Neuroimaging

## 2023-12-04 ENCOUNTER — Encounter: Payer: Self-pay | Admitting: Diagnostic Neuroimaging

## 2023-12-04 VITALS — BP 127/82 | HR 81 | Ht 71.0 in | Wt 217.0 lb

## 2023-12-04 DIAGNOSIS — I6330 Cerebral infarction due to thrombosis of unspecified cerebral artery: Secondary | ICD-10-CM | POA: Diagnosis not present

## 2023-12-04 NOTE — Progress Notes (Signed)
 [facility]  PATIENT: Hayden Williams DOB: October 28, 1955  REFERRING CLINICIAN: Fairy Frames, MD HISTORY FROM: patient REASON FOR VISIT: new consult   HISTORICAL  CHIEF COMPLAINT:  Chief Complaint  Patient presents with   Cerebrovascular Accident    RM 7 alone  Pt is well, reports residual R leg/foot numbness. No other concerns.     HISTORY OF PRESENT ILLNESS:   68 year old male with hypertension, diabetes, hyperlipidemia, sleep apnea, coronary artery disease, here for evaluation of possible stroke follow-up.  Patient is doing well.  Right-sided numbness has essentially resolved, with some residual in the right leg.  He is tolerating medications.  There was some question of his antiplatelet regimen discharge instructions.  He was supposed to take Brilinta  90 and Plavix  75 for 4 weeks and then aspirin  81 mg daily alone.  Instead he completed Brilinta  and Plavix  for 4 weeks and then has taken Brilinta  60 mg twice a day and aspirin  81 mg daily alone.     REVIEW OF SYSTEMS: Full 14 system review of systems performed and negative with exception of: as per HPI.  ALLERGIES: Allergies  Allergen Reactions   Peanut (Diagnostic) Anaphylaxis and Swelling   Peanut Oil Anaphylaxis and Swelling   Niaspan [Niacin] Other (See Comments)    Unknown reaction    Zestril [Lisinopril] Other (See Comments) and Cough    Increased BUN and creatinine      HOME MEDICATIONS: Outpatient Medications Prior to Visit  Medication Sig Dispense Refill   allopurinol  (ZYLOPRIM ) 100 MG tablet Take 100 mg by mouth 2 (two) times daily.     amLODipine  (NORVASC ) 10 MG tablet Take 10 mg by mouth every evening.  3   aspirin  EC 81 MG tablet Take 1 tablet (81 mg total) by mouth daily. To start in 4 weeks once Plavix  and Brillinta course completed     atorvastatin  (LIPITOR ) 80 MG tablet TAKE 1 TABLET BY MOUTH DAILY 15 tablet 0   BRILINTA  60 MG TABS tablet Take 60 mg by mouth 2 (two) times daily.      Cholecalciferol (VITAMIN D-3 PO) Take 1 capsule by mouth daily.     Dulaglutide  (TRULICITY ) 4.5 MG/0.5ML SOAJ Inject 4.5 mg into the skin every Monday.     ezetimibe  (ZETIA ) 10 MG tablet Take 1 tablet (10 mg total) by mouth daily. 30 tablet 1   insulin  aspart (NOVOLOG  FLEXPEN) 100 UNIT/ML FlexPen Inject 3-10 Units into the skin See admin instructions. Inject 3-10 units three times daily before each meal, per sliding scale: Under 100: 3 units 100 - 150 : 5 units 151 - 200 : 7 units 201 - 250 : 8 units 251 - 300 : 9 units Over 300 : 10 units     insulin  glargine, 1 Unit Dial , (TOUJEO ) 300 UNIT/ML Solostar Pen Inject 44 Units into the skin daily.     IRON-VITAMIN C  PO Take 1 tablet by mouth daily.     ketoconazole (NIZORAL) 2 % cream Apply 1 application  topically daily as needed for irritation.     metoprolol  tartrate (LOPRESSOR ) 100 MG tablet TAKE 1 TABLET BY MOUTH 2 TIMES A DAY 60 tablet 0   nitroGLYCERIN  (NITROSTAT ) 0.4 MG SL tablet Place 1 tablet (0.4 mg total) under the tongue every 5 (five) minutes as needed for chest pain. 25 tablet 4   pantoprazole  (PROTONIX ) 40 MG tablet Take 1 tablet (40 mg total) by mouth daily. 90 tablet 3   tamsulosin (FLOMAX) 0.4 MG CAPS  capsule Take 0.4 mg by mouth daily.     valsartan -hydrochlorothiazide  (DIOVAN -HCT) 160-25 MG tablet Take 1 tablet by mouth daily. 90 tablet 3   No facility-administered medications prior to visit.    PAST MEDICAL HISTORY: Past Medical History:  Diagnosis Date   CAD (coronary artery disease)    a. 11/2014 Cath/PCI: LM nl, LAD 50p, 60d, RI 40, LCX small, OM1 40, OM2 40, RCA 95d (2.25x20 Promus Premier DES), EF nl.   CKD (chronic kidney disease), stage III (HCC)    Diabetes mellitus (HCC)    Esophageal ulcer    Essential hypertension    GI bleed 06/2018   a. melena/ABL anemia with EGD on 06/27/18 with healed esophageal ulcer, esophagitis, hiatal hernia.   Hyperlipidemia    Sleep apnea     PAST SURGICAL HISTORY: Past  Surgical History:  Procedure Laterality Date   ANKLE SURGERY Right    CARDIAC CATHETERIZATION N/A 12/02/2014   Procedure: Left Heart Cath and Coronary Angiography;  Surgeon: Lonni JONETTA Cash, MD;  Location: [facility] INVASIVE CV LAB;  Service: Cardiovascular;  Laterality: N/A;   CARDIAC CATHETERIZATION N/A 12/02/2014   Procedure: Coronary Stent Intervention;  Surgeon: Lonni JONETTA Cash, MD;  Location: [facility] INVASIVE CV LAB;  Service: Cardiovascular;  Laterality: N/A;   CATARACT EXTRACTION     CORONARY ANGIOPLASTY WITH STENT PLACEMENT     ESOPHAGOGASTRODUODENOSCOPY (EGD) WITH PROPOFOL  N/A 06/27/2018   Procedure: ESOPHAGOGASTRODUODENOSCOPY (EGD) WITH PROPOFOL ;  Surgeon: Aneita Gwendlyn DASEN, MD;  Location: [facility] ENDOSCOPY;  Service: Endoscopy;  Laterality: N/A;   EYE SURGERY     INTRAOCULAR PROSTHESES INSERTION      FAMILY HISTORY: Family History  Problem Relation Age of Onset   Diabetes Mother    Heart disease Mother    Sleep apnea Mother    Cancer Father    Diabetes Father    Diabetes Sister    Cancer Brother    Diabetes Brother     SOCIAL HISTORY: Social History   Socioeconomic History   Marital status: Widowed    Spouse name: [place]   Number of children: 3   Years of education: MBA   Highest education level: Not on file  Occupational History   Not on file  Tobacco Use   Smoking status: Never   Smokeless tobacco: Never  Vaping Use   Vaping status: Never Used  Substance and Sexual Activity   Alcohol use: No    Alcohol/week: 0.0 standard drinks of alcohol   Drug use: No   Sexual activity: Not on file  Other Topics Concern   Not on file  Social History Narrative   Drinks caffeien 4-5 times a month    Social Drivers of Corporate investment banker Strain: Not on file  Food Insecurity: Low Risk  (11/20/2023)   Received from [facility]   Hunger Vital Sign    Within the past 12 months, you worried that your food would run out before you got money to buy more: Never  true    Within the past 12 months, the food you bought just didn't last and you didn't have money to get more. : Never true  Recent Concern: Food Insecurity - Medium Risk (10/31/2023)   Received from [facility]   Hunger Vital Sign    Within the past 12 months, you worried that your food would run out before you got money to buy more: Sometimes true    Within the past 12 months, the food you bought just didn't last  and you didn't have money to get more. : Sometimes true  Transportation Needs: No Transportation Needs (11/20/2023)   Received from [facility]    In the past 12 months, has lack of reliable transportation kept you from medical appointments, meetings, work or from getting things needed for daily living? : No  Physical Activity: Not on file  Stress: Not on file  Social Connections: Moderately Isolated (10/26/2023)   Social Connection and Isolation Panel    Frequency of Communication with Friends and Family: More than three times a week    Frequency of Social Gatherings with Friends and Family: More than three times a week    Attends Religious Services: More than 4 times per year    Active Member of [employer] or Organizations: No    Attends Banker Meetings: Never    Marital Status: Widowed  Intimate Partner Violence: Not At Risk (10/26/2023)   Humiliation, Afraid, Rape, and Kick questionnaire    Fear of Current or Ex-Partner: No    Emotionally Abused: No    Physically Abused: No    Sexually Abused: No     PHYSICAL EXAM  GENERAL EXAM/CONSTITUTIONAL: Vitals:  Vitals:   12/04/23 1002  BP: 127/82  Pulse: 81  Weight: 217 lb (98.4 kg)  Height: 5' 11 (1.803 m)   Body mass index is 30.27 kg/m. Wt Readings from Last 3 Encounters:  12/04/23 217 lb (98.4 kg)  10/26/23 215 lb (97.5 kg)  10/25/23 215 lb (97.5 kg)   Patient is in no distress; well developed, nourished and groomed; neck is supple  CARDIOVASCULAR: Examination of carotid arteries  is normal; no carotid bruits Regular rate and rhythm, no murmurs Examination of peripheral vascular system by observation and palpation is normal  EYES: Ophthalmoscopic exam of optic discs and posterior segments is normal; no papilledema or hemorrhages No results found.  MUSCULOSKELETAL: Gait, strength, tone, movements noted in Neurologic exam below  NEUROLOGIC: MENTAL STATUS:      No data to display         awake, alert, oriented to person, place and time recent and remote memory intact normal attention and concentration language fluent, comprehension intact, naming intact fund of knowledge appropriate  CRANIAL NERVE:  2nd - no papilledema on fundoscopic exam 2nd, 3rd, 4th, 6th - pupils equal and reactive to light, visual fields full to confrontation, extraocular muscles intact, no nystagmus 5th - facial sensation symmetric 7th - facial strength symmetric 8th - hearing intact 9th - palate elevates symmetrically, uvula midline 11th - shoulder shrug symmetric 12th - tongue protrusion midline  MOTOR:  normal bulk and tone, full strength in the BUE, BLE  SENSORY:  normal and symmetric to light touch, temperature, vibration  COORDINATION:  finger-nose-finger, fine finger movements normal  REFLEXES:  deep tendon reflexes TRACE and symmetric  GAIT/STATION:  narrow based gait     DIAGNOSTIC DATA (LABS, IMAGING, TESTING) - I reviewed patient records, labs, notes, testing and imaging myself where available.  Lab Results  Component Value Date   WBC 4.9 10/25/2023   HGB 13.3 10/25/2023   HCT 37.3 (L) 10/25/2023   MCV 89.7 10/25/2023   PLT 246 10/25/2023      Component Value Date/Time   NA 138 10/25/2023 1709   K 4.1 10/25/2023 1709   CL 105 10/25/2023 1709   CO2 23 10/25/2023 1709   GLUCOSE 210 (H) 10/25/2023 1709   BUN 36 (H) 10/25/2023 1709   CREATININE 2.49 (H) 10/25/2023 1709  CREATININE 2.28 (H) 09/11/2015 1009   CALCIUM  8.7 (L) 10/25/2023 1709    PROT 7.0 10/25/2023 1709   ALBUMIN 3.5 10/25/2023 1709   AST 30 10/25/2023 1709   ALT 40 10/25/2023 1709   ALKPHOS 76 10/25/2023 1709   BILITOT 0.8 10/25/2023 1709   GFRNONAA 28 (L) 10/25/2023 1709   GFRAA 36 (L) 06/27/2018 0911   Lab Results  Component Value Date   CHOL 106 10/27/2023   HDL 27 (L) 10/27/2023   LDLCALC 53 10/27/2023   TRIG 128 10/27/2023   CHOLHDL 3.9 10/27/2023   Lab Results  Component Value Date   HGBA1C 7.7 (H) 10/27/2023   Lab Results  Component Value Date   VITAMINB12 425 06/25/2018   No results found for: TSH   10/26/23 MRI brain [I reviewed images myself and agree with interpretation. Small left pontine chronic lacunar ischemic infarction also noted. -VRP]  - 9 mm acute ischemic nonhemorrhagic left paramedian medullary infarct. - Underlying mild chronic microvascular ischemic disease for age.   ASSESSMENT AND PLAN  68 y.o. year old male here with:  Dx:  1. Cerebrovascular accident (CVA) due to thrombosis of cerebral artery (HCC)     PLAN:  LEFT PARAMEDIAN MEDULLARY INFARCTION (right arm, right leg numbness; small vessel thrombosis) - MRI noted 9 mm acute ischemic nonhemorrhagic left paramedian medullary infarct. - MRA without large vessel occlusion - 2D echo Left ventricular ejection fraction, by estimation, is 70 to 75% - carotid duplex --> NO STENOSIS - Hemoglobin A1c is 7.7, LDL is 53 -Seen by Dr. Rosemarie with stroke team, recommended Plavix  and Brilinta  for 4 weeks followed by aspirin  81mg  daily alone; from neurology standpoint I would recommend single anti-platelet therapy long term --> aspirin  81mg  daily; discussed with Dr. Rosemarie and Dr. Verlin and they agree - continue Zetia  10mg  and continuing lipitor  80mg  - recommend to defer elective surgery / procedures for 3 month post stroke (such as upcoming prostate biopsy, if it is ok from urology standpoint to wait)   Type II diabetes mellitus with renal manifestations (HCC) -A1c 7.7,  continue insulin    Essential hypertension - continue home meds  Return for pending if symptoms worsen or fail to improve, return to PCP.  I reviewed images, labs, notes, records myself. I summarized findings and reviewed with patient, for this high risk condition (stroke) requiring high complexity decision making.    EDUARD FABIENE HANLON, MD 12/04/2023, 11:50 AM Certified in Neurology, Neurophysiology and Neuroimaging  [facility] [address] [place], [place] [address] [phone]

## 2023-12-31 ENCOUNTER — Emergency Department (HOSPITAL_BASED_OUTPATIENT_CLINIC_OR_DEPARTMENT_OTHER): Payer: Medicare (Managed Care)

## 2023-12-31 ENCOUNTER — Other Ambulatory Visit: Payer: Self-pay

## 2023-12-31 ENCOUNTER — Inpatient Hospital Stay (HOSPITAL_BASED_OUTPATIENT_CLINIC_OR_DEPARTMENT_OTHER)
Admission: EM | Admit: 2023-12-31 | Discharge: 2024-01-04 | DRG: 322 | Disposition: A | Discharge status: Home / Self Care | Payer: Medicare (Managed Care) | Attending: Family Medicine | Admitting: Family Medicine

## 2023-12-31 ENCOUNTER — Encounter (HOSPITAL_BASED_OUTPATIENT_CLINIC_OR_DEPARTMENT_OTHER): Payer: Self-pay

## 2023-12-31 DIAGNOSIS — H5461 Unqualified visual loss, right eye, normal vision left eye: Secondary | ICD-10-CM | POA: Diagnosis present

## 2023-12-31 DIAGNOSIS — I255 Ischemic cardiomyopathy: Secondary | ICD-10-CM | POA: Diagnosis present

## 2023-12-31 DIAGNOSIS — N184 Chronic kidney disease, stage 4 (severe): Secondary | ICD-10-CM | POA: Diagnosis present

## 2023-12-31 DIAGNOSIS — I2510 Atherosclerotic heart disease of native coronary artery without angina pectoris: Secondary | ICD-10-CM | POA: Diagnosis present

## 2023-12-31 DIAGNOSIS — Z8673 Personal history of transient ischemic attack (TIA), and cerebral infarction without residual deficits: Secondary | ICD-10-CM

## 2023-12-31 DIAGNOSIS — R079 Chest pain, unspecified: Secondary | ICD-10-CM | POA: Diagnosis not present

## 2023-12-31 DIAGNOSIS — I214 Non-ST elevation (NSTEMI) myocardial infarction: Secondary | ICD-10-CM | POA: Diagnosis not present

## 2023-12-31 DIAGNOSIS — Z7982 Long term (current) use of aspirin: Secondary | ICD-10-CM

## 2023-12-31 DIAGNOSIS — Z7902 Long term (current) use of antithrombotics/antiplatelets: Secondary | ICD-10-CM

## 2023-12-31 DIAGNOSIS — Z833 Family history of diabetes mellitus: Secondary | ICD-10-CM

## 2023-12-31 DIAGNOSIS — Y828 Other medical devices associated with adverse incidents: Secondary | ICD-10-CM | POA: Diagnosis present

## 2023-12-31 DIAGNOSIS — R7989 Other specified abnormal findings of blood chemistry: Secondary | ICD-10-CM

## 2023-12-31 DIAGNOSIS — I252 Old myocardial infarction: Secondary | ICD-10-CM

## 2023-12-31 DIAGNOSIS — N189 Chronic kidney disease, unspecified: Secondary | ICD-10-CM | POA: Diagnosis present

## 2023-12-31 DIAGNOSIS — I130 Hypertensive heart and chronic kidney disease with heart failure and stage 1 through stage 4 chronic kidney disease, or unspecified chronic kidney disease: Secondary | ICD-10-CM | POA: Diagnosis present

## 2023-12-31 DIAGNOSIS — Z6830 Body mass index (BMI) 30.0-30.9, adult: Secondary | ICD-10-CM

## 2023-12-31 DIAGNOSIS — E785 Hyperlipidemia, unspecified: Secondary | ICD-10-CM | POA: Diagnosis present

## 2023-12-31 DIAGNOSIS — I4510 Unspecified right bundle-branch block: Secondary | ICD-10-CM | POA: Diagnosis present

## 2023-12-31 DIAGNOSIS — Z7985 Long-term (current) use of injectable non-insulin antidiabetic drugs: Secondary | ICD-10-CM

## 2023-12-31 DIAGNOSIS — R12 Heartburn: Secondary | ICD-10-CM | POA: Diagnosis present

## 2023-12-31 DIAGNOSIS — Z794 Long term (current) use of insulin: Secondary | ICD-10-CM

## 2023-12-31 DIAGNOSIS — Z79899 Other long term (current) drug therapy: Secondary | ICD-10-CM

## 2023-12-31 DIAGNOSIS — Z8249 Family history of ischemic heart disease and other diseases of the circulatory system: Secondary | ICD-10-CM

## 2023-12-31 DIAGNOSIS — M109 Gout, unspecified: Secondary | ICD-10-CM | POA: Diagnosis present

## 2023-12-31 DIAGNOSIS — Z888 Allergy status to other drugs, medicaments and biological substances status: Secondary | ICD-10-CM

## 2023-12-31 DIAGNOSIS — Z5941 Food insecurity: Secondary | ICD-10-CM

## 2023-12-31 DIAGNOSIS — E66811 Obesity, class 1: Secondary | ICD-10-CM | POA: Diagnosis present

## 2023-12-31 DIAGNOSIS — D631 Anemia in chronic kidney disease: Secondary | ICD-10-CM | POA: Diagnosis present

## 2023-12-31 DIAGNOSIS — Z955 Presence of coronary angioplasty implant and graft: Secondary | ICD-10-CM

## 2023-12-31 DIAGNOSIS — N400 Enlarged prostate without lower urinary tract symptoms: Secondary | ICD-10-CM | POA: Diagnosis present

## 2023-12-31 DIAGNOSIS — E662 Morbid (severe) obesity with alveolar hypoventilation: Secondary | ICD-10-CM | POA: Diagnosis present

## 2023-12-31 DIAGNOSIS — Z91010 Allergy to peanuts: Secondary | ICD-10-CM

## 2023-12-31 DIAGNOSIS — I249 Acute ischemic heart disease, unspecified: Principal | ICD-10-CM

## 2023-12-31 DIAGNOSIS — I21A9 Other myocardial infarction type: Secondary | ICD-10-CM | POA: Diagnosis present

## 2023-12-31 DIAGNOSIS — I5042 Chronic combined systolic (congestive) and diastolic (congestive) heart failure: Secondary | ICD-10-CM | POA: Diagnosis present

## 2023-12-31 DIAGNOSIS — T82855A Stenosis of coronary artery stent, initial encounter: Principal | ICD-10-CM | POA: Diagnosis present

## 2023-12-31 DIAGNOSIS — E1122 Type 2 diabetes mellitus with diabetic chronic kidney disease: Secondary | ICD-10-CM | POA: Diagnosis present

## 2023-12-31 LAB — CBC WITH DIFFERENTIAL/PLATELET
Abs Immature Granulocytes: 0.01 K/uL (ref 0.00–0.07)
Basophils Absolute: 0 K/uL (ref 0.0–0.1)
Basophils Relative: 1 %
Eosinophils Absolute: 0.1 K/uL (ref 0.0–0.5)
Eosinophils Relative: 2 %
HCT: 35.4 % — ABNORMAL LOW (ref 39.0–52.0)
Hemoglobin: 12.5 g/dL — ABNORMAL LOW (ref 13.0–17.0)
Immature Granulocytes: 0 %
Lymphocytes Relative: 32 %
Lymphs Abs: 1.8 K/uL (ref 0.7–4.0)
MCH: 31.6 pg (ref 26.0–34.0)
MCHC: 35.3 g/dL (ref 30.0–36.0)
MCV: 89.4 fL (ref 80.0–100.0)
Monocytes Absolute: 0.5 K/uL (ref 0.1–1.0)
Monocytes Relative: 10 %
Neutro Abs: 3.1 K/uL (ref 1.7–7.7)
Neutrophils Relative %: 55 %
Platelets: 205 K/uL (ref 150–400)
RBC: 3.96 MIL/uL — ABNORMAL LOW (ref 4.22–5.81)
RDW: 13.1 % (ref 11.5–15.5)
WBC: 5.6 K/uL (ref 4.0–10.5)
nRBC: 0 % (ref 0.0–0.2)

## 2023-12-31 MED ORDER — ASPIRIN 81 MG PO CHEW
324.0000 mg | CHEWABLE_TABLET | Freq: Once | ORAL | Status: AC
  Administered 2023-12-31: 324 mg via ORAL
  Filled 2023-12-31: qty 4

## 2023-12-31 MED ORDER — NITROGLYCERIN 0.4 MG SL SUBL: 0.4000 mg | SUBLINGUAL_TABLET | SUBLINGUAL | Status: DC | PRN

## 2023-12-31 NOTE — ED Provider Notes (Signed)
 [place] EMERGENCY DEPARTMENT AT [facility]  Provider Note  CSN: [id number] Arrival date & time: 12/31/23 2309  History Chief Complaint  Patient presents with   Chest Pain    Hayden Williams is a 68 y.o. Hayden with history of CAD, s/p DES in 2016 and more recently ischemic stroke in Jun 2025, was on Brilinta  and Plavix  for 4 weeks, now on ASA 81mg  only. He reports he was at the movies about prior to arrival when he began having chest pressure. He thought it might be indigestion, but belching did not seem to help much. He took one NTG and had some relief, but still having moderate pain. He reports some SOB, no diaphoresis or nausea. He reports some recent decrease in his usual energy level, but no exertional chest pains.    Home Medications Prior to Admission medications   Medication Sig Start Date End Date Taking? Authorizing Provider  allopurinol  (ZYLOPRIM ) 100 MG tablet Take 100 mg by mouth 2 (two) times daily. 09/03/21   [provider]  amLODipine  (NORVASC ) 10 MG tablet Take 10 mg by mouth every evening. 02/23/15   [provider]  aspirin  EC 81 MG tablet Take 1 tablet (81 mg total) by mouth daily. To start in 4 weeks once Plavix  and Brillinta course completed 11/24/23   Fairy Frames, MD  atorvastatin  (LIPITOR ) 80 MG tablet TAKE 1 TABLET BY MOUTH DAILY 06/02/23   Verlin Lonni BIRCH, MD  Cholecalciferol (VITAMIN D-3 PO) Take 1 capsule by mouth daily.    [provider]  Dulaglutide  (TRULICITY ) 4.5 MG/0.5ML SOAJ Inject 4.5 mg into the skin every Monday.    [provider]  ezetimibe  (ZETIA ) 10 MG tablet Take 1 tablet (10 mg total) by mouth daily. 10/27/23 10/26/24  Fairy Frames, MD  insulin  aspart (NOVOLOG  FLEXPEN) 100 UNIT/ML FlexPen Inject 3-10 Units into the skin See admin instructions. Inject 3-10 units three times daily before each meal, per sliding scale: Under 100: 3 units 100 - 150 : 5 units 151 - 200 : 7 units 201 -  250 : 8 units 251 - 300 : 9 units Over 300 : 10 units    [provider]  insulin  glargine, 1 Unit Dial , (TOUJEO ) 300 UNIT/ML Solostar Pen Inject 44 Units into the skin daily. 01/11/19   [provider]  IRON-VITAMIN C  PO Take 1 tablet by mouth daily.    [provider]  ketoconazole (NIZORAL) 2 % cream Apply 1 application  topically daily as needed for irritation.    [provider]  metoprolol  tartrate (LOPRESSOR ) 100 MG tablet TAKE 1 TABLET BY MOUTH 2 TIMES A DAY 06/06/23   Verlin Lonni BIRCH, MD  nitroGLYCERIN  (NITROSTAT ) 0.4 MG SL tablet Place 1 tablet (0.4 mg total) under the tongue every 5 (five) minutes as needed for chest pain. 07/18/23   [facility], Jon Garre, PA  pantoprazole  (PROTONIX ) 40 MG tablet Take 1 tablet (40 mg total) by mouth daily. 05/18/21   Verlin Lonni BIRCH, MD  tamsulosin  (FLOMAX ) 0.4 MG CAPS capsule Take 0.4 mg by mouth daily.    [provider]  valsartan -hydrochlorothiazide  (DIOVAN -HCT) 160-25 MG tablet Take 1 tablet by mouth daily. 05/18/21   Verlin Lonni BIRCH, MD     Allergies    Peanut (diagnostic), Peanut oil, Niaspan [niacin], and Zestril [lisinopril]   Review of Systems   Review of Systems Please see HPI for pertinent positives and negatives  Physical Exam BP (!) 153/100   Pulse 99  Temp 98.2 F (36.8 C) (Oral)   Resp 19   Ht 5' 11 (1.803 m)   Wt 97.5 kg   SpO2 95%   BMI 29.99 kg/m   Physical Exam Vitals and nursing note reviewed.  Constitutional:      Appearance: Normal appearance.  HENT:     Head: Normocephalic and atraumatic.     Nose: Nose normal.     Mouth/Throat:     Mouth: Mucous membranes are moist.  Eyes:     Extraocular Movements: Extraocular movements intact.     Conjunctiva/sclera: Conjunctivae normal.  Cardiovascular:     Rate and Rhythm: Normal rate.  Pulmonary:     Effort: Pulmonary effort is normal.     Breath sounds: Normal breath sounds.  Abdominal:      General: Abdomen is flat.     Palpations: Abdomen is soft.     Tenderness: There is no abdominal tenderness.  Musculoskeletal:        General: No swelling. Normal range of motion.     Cervical back: Neck supple.     Right lower leg: No edema.     Left lower leg: No edema.  Skin:    General: Skin is warm and dry.  Neurological:     General: No focal deficit present.     Mental Status: He is alert.  Psychiatric:        Mood and Affect: Mood normal.     ED Results / Procedures / Treatments   EKG EKG Interpretation Date/Time:  Sunday December 31 2023 23:16:17 EDT Ventricular Rate:  95 PR Interval:  158 QRS Duration:  145 QT Interval:  385 QTC Calculation: 484 R Axis:   70  Text Interpretation: Sinus rhythm Right bundle branch block Inferior infarct, age indeterminate No significant change since last tracing Confirmed by Roselyn Dunnings [phone]) on 12/31/2023 11:27:16 PM  Procedures Procedures  Medications Ordered in the ED Medications  nitroGLYCERIN  (NITROSTAT ) SL tablet 0.4 mg (has no administration in time range)  aspirin  chewable tablet 324 mg (324 mg Oral Given 12/31/23 2326)  alum & mag hydroxide-simeth (MAALOX/MYLANTA) 200-200-20 MG/5ML suspension 30 mL (30 mLs Oral Given 01/01/24 0047)    Initial Impression and Plan  Patient with history of CAD, here with non-exertional chest pain. Vitals are reassuring. Will check labs, CXR. ASA and NTG for continued pain.   ED Course   Clinical Course as of 01/01/24 0226  [place] Dec 31, 2023  2349 CBC is unremarkable.  [CS]  2358 I personally viewed the images from radiology studies and agree with radiologist interpretation: CXR is clear [CS]  Mon Jan 01, 2024  0023 BMP with CKD at baseline. Initial Trop is elevated. Will discuss with Cardiology. Pain is improved, but  not completely resolved after NTG.  [CS]  0036 Discussed with Dr. Charlott, Cardiology, who requests we check a delta trop before admission or anticoagulation.  [CS]   0158 Delta troponin is improved from initial. Will discuss admission with Hospitalist.  [CS]  0225 Spoke with Dr. Patsy, Hospitalist, who will accept for admission. Patient reports pain is improved.  [CS]    Clinical Course User Index [CS] Roselyn Dunnings NOVAK, MD     MDM Rules/Calculators/A&P Medical Decision Making Problems Addressed: Chest pain, unspecified type: acute illness or injury Chronic kidney disease, unspecified CKD stage: chronic illness or injury Elevated troponin: acute illness or injury  Amount and/or Complexity of Data Reviewed Labs: ordered. Decision-making details documented in ED Course. Radiology: ordered and independent  interpretation performed. Decision-making details documented in ED Course. ECG/medicine tests: ordered and independent interpretation performed. Decision-making details documented in ED Course.  Risk OTC drugs. Prescription drug management. Decision regarding hospitalization.     Final Clinical Impression(s) / ED Diagnoses Final diagnoses:  Chest pain, unspecified type  Chronic kidney disease, unspecified CKD stage  Elevated troponin    Rx / DC Orders ED Discharge Orders     None        Roselyn Carlin NOVAK, MD 01/01/24 [phone]

## 2023-12-31 NOTE — ED Notes (Signed)
 ED Provider at bedside.

## 2023-12-31 NOTE — ED Triage Notes (Signed)
 Says he was at a movie theater tonight eating popcorn and ~30 minutes ago began having left sided chest pains.   Suspected it was indigestion since he began burping. Took 1 SL n.itro ~20 minutes pta.   Hx of 1 cardiac stent being placed 2016.

## 2024-01-01 DIAGNOSIS — N184 Chronic kidney disease, stage 4 (severe): Secondary | ICD-10-CM | POA: Diagnosis not present

## 2024-01-01 DIAGNOSIS — R079 Chest pain, unspecified: Secondary | ICD-10-CM

## 2024-01-01 DIAGNOSIS — I214 Non-ST elevation (NSTEMI) myocardial infarction: Secondary | ICD-10-CM | POA: Diagnosis not present

## 2024-01-01 LAB — BASIC METABOLIC PANEL WITH GFR
Anion gap: 13 (ref 5–15)
BUN: 46 mg/dL — ABNORMAL HIGH (ref 8–23)
CO2: 23 mmol/L (ref 22–32)
Calcium: 9 mg/dL (ref 8.9–10.3)
Chloride: 106 mmol/L (ref 98–111)
Creatinine, Ser: 2.49 mg/dL — ABNORMAL HIGH (ref 0.61–1.24)
GFR, Estimated: 27 mL/min — ABNORMAL LOW (ref >60)
Glucose, Bld: 150 mg/dL — ABNORMAL HIGH (ref 70–99)
Potassium: 3.8 mmol/L (ref 3.5–5.1)
Sodium: 141 mmol/L (ref 135–145)

## 2024-01-01 LAB — HEPARIN LEVEL (UNFRACTIONATED): Heparin Unfractionated: 0.59 [IU]/mL (ref 0.30–0.70)

## 2024-01-01 LAB — TROPONIN T, HIGH SENSITIVITY
Troponin T High Sensitivity: 119 ng/L (ref 0–19)
Troponin T High Sensitivity: 108 ng/L (ref 0–19)
Troponin T High Sensitivity: 157 ng/L (ref 0–19)

## 2024-01-01 LAB — TROPONIN I (HIGH SENSITIVITY)
Troponin I (High Sensitivity): 1844 ng/L (ref <18)
Troponin I (High Sensitivity): 1920 ng/L (ref <18)

## 2024-01-01 MED ORDER — ATORVASTATIN CALCIUM 80 MG PO TABS
80.0000 mg | ORAL_TABLET | Freq: Every day | ORAL | Status: DC
Start: 2024-01-01 — End: 2024-01-04
  Administered 2024-01-01 – 2024-01-04 (×4): 80 mg via ORAL
  Filled 2024-01-01 (×4): qty 1

## 2024-01-01 MED ORDER — OXYCODONE HCL 5 MG PO TABS: 5.0000 mg | ORAL_TABLET | ORAL | Status: DC | PRN

## 2024-01-01 MED ORDER — INSULIN ASPART 100 UNIT/ML IJ SOLN
0.0000 [IU] | Freq: Every day | INTRAMUSCULAR | Status: DC
  Administered 2024-01-01 – 2024-01-02 (×2): 3 [IU] via SUBCUTANEOUS
  Administered 2024-01-03: 2 [IU] via SUBCUTANEOUS

## 2024-01-01 MED ORDER — METOPROLOL TARTRATE 100 MG PO TABS
100.0000 mg | ORAL_TABLET | Freq: Two times a day (BID) | ORAL | Status: DC
  Administered 2024-01-01 – 2024-01-04 (×4): 100 mg via ORAL
  Filled 2024-01-01 (×2): qty 1
  Filled 2024-01-01 (×2): qty 2

## 2024-01-01 MED ORDER — INSULIN ASPART 100 UNIT/ML IJ SOLN
0.0000 [IU] | Freq: Three times a day (TID) | INTRAMUSCULAR | Status: DC
  Administered 2024-01-03: 2 [IU] via SUBCUTANEOUS

## 2024-01-01 MED ORDER — ONDANSETRON HCL 4 MG PO TABS: 4.0000 mg | ORAL_TABLET | Freq: Four times a day (QID) | ORAL | Status: DC | PRN

## 2024-01-01 MED ORDER — PANTOPRAZOLE SODIUM 40 MG PO TBEC
40.0000 mg | DELAYED_RELEASE_TABLET | Freq: Every day | ORAL | Status: DC
  Administered 2024-01-02 – 2024-01-04 (×3): 40 mg via ORAL
  Filled 2024-01-01 (×3): qty 1

## 2024-01-01 MED ORDER — ALUM & MAG HYDROXIDE-SIMETH 200-200-20 MG/5ML PO SUSP
15.0000 mL | Freq: Four times a day (QID) | ORAL | Status: DC | PRN
  Administered 2024-01-01 (×2): 15 mL via ORAL

## 2024-01-01 MED ORDER — EZETIMIBE 10 MG PO TABS
10.0000 mg | ORAL_TABLET | Freq: Every day | ORAL | Status: DC
  Administered 2024-01-01 – 2024-01-04 (×4): 10 mg via ORAL
  Filled 2024-01-01 (×4): qty 1

## 2024-01-01 MED ORDER — AMLODIPINE BESYLATE 10 MG PO TABS
10.0000 mg | ORAL_TABLET | Freq: Every evening | ORAL | Status: DC
  Administered 2024-01-01 – 2024-01-03 (×3): 10 mg via ORAL
  Filled 2024-01-01 (×3): qty 1

## 2024-01-01 MED ORDER — CALCIUM CARBONATE ANTACID 500 MG PO CHEW
1.0000 | CHEWABLE_TABLET | Freq: Once | ORAL | Status: AC
  Administered 2024-01-01: 200 mg via ORAL
  Filled 2024-01-01: qty 1

## 2024-01-01 MED ORDER — ISOSORBIDE MONONITRATE ER 30 MG PO TB24
30.0000 mg | ORAL_TABLET | Freq: Every day | ORAL | Status: DC
  Administered 2024-01-01 – 2024-01-04 (×3): 30 mg via ORAL
  Filled 2024-01-01 (×3): qty 1

## 2024-01-01 MED ORDER — ONDANSETRON HCL 4 MG/2ML IJ SOLN: 4.0000 mg | Freq: Four times a day (QID) | INTRAMUSCULAR | Status: DC | PRN

## 2024-01-01 MED ORDER — HEPARIN BOLUS VIA INFUSION
4000.0000 [IU] | Freq: Once | INTRAVENOUS | Status: AC
  Administered 2024-01-01: 4000 [IU] via INTRAVENOUS

## 2024-01-01 MED ORDER — ACETAMINOPHEN 650 MG RE SUPP: 650.0000 mg | Freq: Four times a day (QID) | RECTAL | Status: DC | PRN

## 2024-01-01 MED ORDER — ACETAMINOPHEN 325 MG PO TABS: 650.0000 mg | ORAL_TABLET | Freq: Four times a day (QID) | ORAL | Status: DC | PRN

## 2024-01-01 MED ORDER — HEPARIN (PORCINE) 25000 UT/250ML-% IV SOLN
1100.0000 [IU]/h | INTRAVENOUS | Status: DC
  Administered 2024-01-01 – 2024-01-02 (×3): 1200 [IU]/h via INTRAVENOUS
  Administered 2024-01-03: 1150 [IU]/h via INTRAVENOUS
  Filled 2024-01-01 (×3): qty 250

## 2024-01-01 MED ORDER — NITROGLYCERIN 0.4 MG SL SUBL
0.4000 mg | SUBLINGUAL_TABLET | SUBLINGUAL | Status: DC | PRN
  Administered 2024-01-02: 0.4 mg via SUBLINGUAL
  Filled 2024-01-01: qty 1

## 2024-01-01 MED ORDER — ASPIRIN 81 MG PO TBEC
81.0000 mg | DELAYED_RELEASE_TABLET | Freq: Every day | ORAL | Status: DC
  Administered 2024-01-02 – 2024-01-04 (×2): 81 mg via ORAL
  Filled 2024-01-01 (×3): qty 1

## 2024-01-01 MED ORDER — ALUM & MAG HYDROXIDE-SIMETH 200-200-20 MG/5ML PO SUSP
30.0000 mL | Freq: Once | ORAL | Status: DC
  Filled 2024-01-01: qty 30

## 2024-01-01 MED ORDER — ALUM & MAG HYDROXIDE-SIMETH 200-200-20 MG/5ML PO SUSP
30.0000 mL | Freq: Once | ORAL | Status: AC
  Administered 2024-01-01: 30 mL via ORAL
  Filled 2024-01-01: qty 30

## 2024-01-01 MED ORDER — PANTOPRAZOLE SODIUM 40 MG PO TBEC
40.0000 mg | DELAYED_RELEASE_TABLET | Freq: Once | ORAL | Status: AC
  Administered 2024-01-01: 40 mg via ORAL
  Filled 2024-01-01: qty 1

## 2024-01-01 MED ORDER — SODIUM CHLORIDE 0.9 % IV BOLUS
1000.0000 mL | Freq: Once | INTRAVENOUS | Status: AC
  Administered 2024-01-01: 1000 mL via INTRAVENOUS

## 2024-01-01 MED ORDER — ALLOPURINOL 100 MG PO TABS
100.0000 mg | ORAL_TABLET | Freq: Two times a day (BID) | ORAL | Status: DC
  Administered 2024-01-01 – 2024-01-04 (×5): 100 mg via ORAL
  Filled 2024-01-01 (×5): qty 1

## 2024-01-01 MED ORDER — INSULIN GLARGINE 100 UNIT/ML ~~LOC~~ SOLN
25.0000 [IU] | Freq: Every day | SUBCUTANEOUS | Status: DC
  Administered 2024-01-01 – 2024-01-04 (×3): 25 [IU] via SUBCUTANEOUS
  Filled 2024-01-01 (×4): qty 0.25

## 2024-01-01 MED ORDER — TAMSULOSIN HCL 0.4 MG PO CAPS
0.4000 mg | ORAL_CAPSULE | Freq: Every day | ORAL | Status: DC
  Administered 2024-01-01 – 2024-01-04 (×4): 0.4 mg via ORAL
  Filled 2024-01-01 (×4): qty 1

## 2024-01-01 NOTE — Plan of Care (Signed)
 Plan of Care Note for accepted transfer   Patient: Hayden Williams MRN: [id number]   DOA: 12/31/2023  Facility requesting transfer: [facility] Requesting Provider: Dr. Roselyn  Reason for transfer: CP Facility course:  68 yo male with PMH CAD s/p stent, CVA (June 2025), CKD4, HTN, HLD, sleep apnea who presented to [facility] after developing CP at the movies and eating popcorn.  EKG negative for signs of ischemia. Trop 119 >> 108 Patient compliant on monotherapy asa at home.   Case discussed with cardiology with rec's for obs overnight and cardiology evaluation in am. No rec's for heparin  at this time unless further uptrend in troponin.   Plan of care: The patient is accepted for admission to Telemetry unit, at [facility].  Author: Alm Apo, MD 01/01/2024  Check [email or web address] for on-call coverage.  Nursing staff, Please call [facility] Admits & Consults System-Wide number on Amion as soon as patient's arrival, so appropriate admitting provider can evaluate the pt.

## 2024-01-01 NOTE — Consult Note (Signed)
 Cardiology Consultation   Patient ID: MONG NEAL MRN: [id number]; DOB: 09-Dec-1955  Admit date: 12/31/2023 Date of Consult: 01/01/2024  PCP:  Lazoff, Shawn P, DO   [facility] Providers Cardiologist:  Hayden Cash, MD  Cardiology APP:  Hayden Williams, [place]     Patient Profile: Hayden Williams is a 68 y.o. male with a hx of CAD with DES to RCA 2016, CKD stage IIIb, hypertension, type 2 diabetes, OSA, GI bleed 2020, BPH, who is being seen 01/01/2024 for the evaluation of elevated troponin at the request of Hayden Williams.  History of Present Illness: Hayden Williams with above past medical history presented to the ER yesterday at [facility] for chest pain.  He was at the movie eating popcorn, developed sudden onset chest pressure.  He thought it was indigestion.  He did take nitroglycerin  with subsequently improved chest discomfort.  At [facility] ER, diagnostic workup from 12/27/2023 showed glucose 150, BUN 46, creatinine 2.49, GFR 27.  High sensitive troponin T [id number].  CBC differential with hemoglobin 12.5.  Chest x-ray showed no acute disease. EKG revealed sinus rhythm 95 bpm, old RBBB.  Repeat EKG without acute changes.  Per chart review, he has a history of CAD and to receive PTCA/DES to distal RCA in 2016.  Heart cath from 12/02/2014 also showed 40% 1st and 2nd marginal lesions, 40% ramus lesion, 50% proximal to mid LAD lesion, 60% distal LAD lesion.  LV systolic function was normal.  Repeat stress Myoview  from 06/23/2017 revealed LVEF 55 to 65%, low restudy without ischemia.  Most recent echo from 10/26/2023 showed LVEF 70 to 75%, no regional motion abnormality, mild concentric LVH, grade 1 DD, normal RV, trivial MR, trivial AI.  He last followed up in the clinic with Hayden Williams 07/18/2023, denied any cardiac complaints able to climb 2 flights of stairs regularly.  He was advised to continue monotherapy with Plavix  75 mg daily, continue amlodipine  10 mg, Lopressor  100 mg  twice daily, valsartan /hydrochlorothiazide  160/25 mg daily for hypertension.  He was continued on Lipitor  80 mg for hyperlipidemia, LDL was 30 from 12/2022 at goal.  Of note, he reportedly had history of GI bleed required admission 06/2018, required PRBC transfusion, EGD from 06/27/2018 revealed nonbleeding esophageal ulcer, esophagitis, hiatal hernia.  He was recommended PPI at the time.   He follows [facility] nephrology for CKD stage IIIb, felt due to hypertension and type 2 diabetes, had slow progression of CKD over the past 1 year.  BMP from 07/28/2023 revealed creatinine 2.19 and GFR 32 which is near his baseline.  He experienced a sudden onset of chest pressure while at the movies, initially attributing it to indigestion. Nitroglycerin  provided some relief but did not completely alleviate the pain. He continues to have a dull chest ache and frequent burping. No symptoms as severe as those in 2016 or 2020.  He has a history of coronary artery disease with a stent placed in 2016 following an episode of shortness of breath and high blood pressure. He was not on blood pressure medication at that time but was started on it after the event. He has been managing his diabetes for nearly 30 years and has been making progress in controlling his A1c, which was 7.7 in the summer.  In 2020, he experienced severe anemia requiring blood transfusions, associated with black stools and diarrhea. His hemoglobin has improved from a previous low of 9 to 12.5, though it has decreased from a baseline of 13.3.  He has  chronic kidney disease with a GFR of 27, managed by his nephrologist. He is cautious about avoiding medications that could worsen his kidney function, such as anti-inflammatories.  He is currently on heparin  and has been experiencing elevated troponin levels, with recent values ranging from 115 to 157. He has not had recent ischemic testing but had an echocardiogram in June showing no wall motion  abnormalities.   Past Medical History:  Diagnosis Date   CAD (coronary artery disease)    a. 11/2014 Cath/PCI: LM nl, LAD 50p, 60d, RI 40, LCX small, OM1 40, OM2 40, RCA 95d (2.25x20 Promus Premier DES), EF nl.   CKD (chronic kidney disease), stage III (HCC)    Diabetes mellitus (HCC)    Esophageal ulcer    Essential hypertension    GI bleed 06/2018   a. melena/ABL anemia with EGD on 06/27/18 with healed esophageal ulcer, esophagitis, hiatal hernia.   Hyperlipidemia    Sleep apnea     Past Surgical History:  Procedure Laterality Date   ANKLE SURGERY Right    CARDIAC CATHETERIZATION N/A 12/02/2014   Procedure: Left Heart Cath and Coronary Angiography;  Surgeon: Hayden Williams Cash, MD;  Location: [facility] INVASIVE CV LAB;  Service: Cardiovascular;  Laterality: N/A;   CARDIAC CATHETERIZATION N/A 12/02/2014   Procedure: Coronary Stent Intervention;  Surgeon: Hayden Williams Cash, MD;  Location: [facility] INVASIVE CV LAB;  Service: Cardiovascular;  Laterality: N/A;   CATARACT EXTRACTION     CORONARY ANGIOPLASTY WITH STENT PLACEMENT     ESOPHAGOGASTRODUODENOSCOPY (EGD) WITH PROPOFOL  N/A 06/27/2018   Procedure: ESOPHAGOGASTRODUODENOSCOPY (EGD) WITH PROPOFOL ;  Surgeon: Hayden Gwendlyn DASEN, MD;  Location: [facility] ENDOSCOPY;  Service: Endoscopy;  Laterality: N/A;   EYE SURGERY     INTRAOCULAR PROSTHESES INSERTION       Home Medications:  Prior to Admission medications   Medication Sig Start Date End Date Taking? Authorizing Provider  allopurinol  (ZYLOPRIM ) 100 MG tablet Take 100 mg by mouth 2 (two) times daily. 09/03/21  Yes [provider]  amLODipine  (NORVASC ) 10 MG tablet Take 10 mg by mouth every evening. 02/23/15  Yes [provider]  aspirin  EC 81 MG tablet Take 1 tablet (81 mg total) by mouth daily. To start in 4 weeks once Plavix  and Brillinta course completed 11/24/23  Yes Hayden Frames, MD  atorvastatin  (LIPITOR ) 80 MG tablet TAKE 1 TABLET BY MOUTH DAILY 06/02/23  Yes Williams Hayden JONETTA, MD  Cholecalciferol (VITAMIN D-3 PO) Take 1 capsule by mouth daily.   Yes [provider]  Dulaglutide  (TRULICITY ) 4.5 MG/0.5ML SOAJ Inject 4.5 mg into the skin every Monday.   Yes [provider]  ezetimibe  (ZETIA ) 10 MG tablet Take 1 tablet (10 mg total) by mouth daily. 10/27/23 10/26/24 Yes Hayden Frames, MD  insulin  aspart (NOVOLOG  FLEXPEN) 100 UNIT/ML FlexPen Inject 3-10 Units into the skin See admin instructions. Inject 3-10 units three times daily before each meal, per sliding scale: Under 100: 3 units 100 - 150 : 5 units 151 - 200 : 7 units 201 - 250 : 8 units 251 - 300 : 9 units Over 300 : 10 units   Yes [provider]  insulin  glargine, 1 Unit Dial , (TOUJEO ) 300 UNIT/ML Solostar Pen Inject 44 Units into the skin daily. 01/11/19  Yes [provider]  IRON-VITAMIN C  PO Take 1 tablet by mouth daily.   Yes [provider]  metoprolol  tartrate (LOPRESSOR ) 100 MG tablet TAKE 1 TABLET BY MOUTH 2 TIMES A DAY 06/06/23  Yes Verlin Hayden BIRCH, MD  MOUNJARO 10 MG/0.5ML Pen Inject 10 mg into the skin once a week. 12/31/23  Yes [provider]  nitroGLYCERIN  (NITROSTAT ) 0.4 MG SL tablet Place 1 tablet (0.4 mg total) under the tongue every 5 (five) minutes as needed for chest pain. 07/18/23  Yes Duke, Hayden Garre, PA  pantoprazole  (PROTONIX ) 40 MG tablet Take 1 tablet (40 mg total) by mouth daily. 05/18/21  Yes Verlin Hayden BIRCH, MD  tamsulosin  (FLOMAX ) 0.4 MG CAPS capsule Take 0.4 mg by mouth daily.   Yes [provider]  valsartan -hydrochlorothiazide  (DIOVAN -HCT) 160-25 MG tablet Take 1 tablet by mouth daily. 05/18/21  Yes Verlin Hayden BIRCH, MD    Scheduled Meds:  allopurinol   100 mg Oral BID   alum & mag hydroxide-simeth  30 mL Oral Once   amLODipine   10 mg Oral QPM   [START ON 01/02/2024] aspirin  EC  81 mg Oral Daily   atorvastatin   80 mg Oral Daily   ezetimibe   10 mg Oral Daily   [START ON  01/02/2024] insulin  aspart  0-15 Units Subcutaneous TID WC   insulin  aspart  0-5 Units Subcutaneous QHS   insulin  glargine  25 Units Subcutaneous Daily   isosorbide  mononitrate  30 mg Oral Daily   metoprolol  tartrate  100 mg Oral BID   [START ON 01/02/2024] pantoprazole   40 mg Oral Daily   tamsulosin   0.4 mg Oral Daily   Continuous Infusions:  heparin  1,200 Units/hr (01/01/24 1636)   PRN Meds: acetaminophen  **OR** acetaminophen , alum & mag hydroxide-simeth, nitroGLYCERIN , ondansetron  **OR** ondansetron  (ZOFRAN ) IV, oxyCODONE   Allergies:    Allergies  Allergen Reactions   Peanut (Diagnostic) Anaphylaxis and Swelling   Peanut Oil Anaphylaxis and Swelling   Niaspan [Niacin] Other (See Comments)    Unknown reaction    Zestril [Lisinopril] Other (See Comments) and Cough    Increased BUN and creatinine      Social History:   Social History   Socioeconomic History   Marital status: Widowed    Spouse name: [place]   Number of children: 3   Years of education: MBA   Highest education level: Not on file  Occupational History   Not on file  Tobacco Use   Smoking status: Never   Smokeless tobacco: Never  Vaping Use   Vaping status: Never Used  Substance and Sexual Activity   Alcohol use: No    Alcohol/week: 0.0 standard drinks of alcohol   Drug use: No   Sexual activity: Not on file  Other Topics Concern   Not on file  Social History Narrative   Drinks caffeien 4-5 times a month    Social Drivers of Corporate investment banker Strain: Not on file  Food Insecurity: No Food Insecurity (01/01/2024)   Hunger Vital Sign    Worried About Running Out of Food in the Last Year: Never true    Ran Out of Food in the Last Year: Never true  Recent Concern: Food Insecurity - Medium Risk (10/31/2023)   Received from [facility]   Hunger Vital Sign    Within the past 12 months, you worried that your food would run out before you got money to buy more: Sometimes true    Within the  past 12 months, the food you bought just didn't last and you didn't have money to get more. : Sometimes true  Transportation Needs: No Transportation Needs (01/01/2024)   PRAPARE - Administrator, Civil Service (Medical): No  Lack of Transportation (Non-Medical): No  Physical Activity: Not on file  Stress: Not on file  Social Connections: Moderately Isolated (01/01/2024)   Social Connection and Isolation Panel    Frequency of Communication with Friends and Family: More than three times a week    Frequency of Social Gatherings with Friends and Family: More than three times a week    Attends Religious Services: More than 4 times per year    Active Member of [employer] or Organizations: No    Attends Banker Meetings: Never    Marital Status: Widowed  Intimate Partner Violence: Not At Risk (01/01/2024)   Humiliation, Afraid, Rape, and Kick questionnaire    Fear of Current or Ex-Partner: No    Emotionally Abused: No    Physically Abused: No    Sexually Abused: No    Family History:    Family History  Problem Relation Age of Onset   Diabetes Mother    Heart disease Mother    Sleep apnea Mother    Cancer Father    Diabetes Father    Diabetes Sister    Cancer Brother    Diabetes Brother      Physical Exam/Data: Vitals:   01/01/24 1100 01/01/24 1130 01/01/24 1447 01/01/24 1549  BP: (!) 145/94 (!) 136/93 (!) 145/98 (!) 127/92  Pulse: 96 96 99 99  Resp: 17 14 16 17   Temp: 98.2 F (36.8 C)  97.9 F (36.6 C) 98.2 F (36.8 C)  TempSrc: Oral  Oral   SpO2: 98% 99% 99% 100%  Weight:      Height:        Intake/Output Summary (Last 24 hours) at 01/01/2024 1845 Last data filed at 01/01/2024 1636 Gross per 24 hour  Intake 61.59 ml  Output --  Net 61.59 ml      12/31/2023   11:16 PM 12/04/2023   10:02 AM 10/26/2023    2:16 AM  Last 3 Weights  Weight (lbs) 215 lb 217 lb 215 lb  Weight (kg) 97.523 kg 98.431 kg 97.523 kg     Body mass index is 29.99 kg/m.   General:  Well nourished, well developed, in no acute distress HEENT: normal Neck: no JVD Vascular: No carotid bruits; Distal pulses 2+ bilaterally +2 radial pulses Cardiac:  normal S1, S2; RRR; no murmur  Lungs:  clear to auscultation bilaterally, no wheezing, rhonchi or rales  Abd: soft, nontender, no hepatomegaly  Ext: no edema Musculoskeletal:  No deformities, BUE and BLE strength normal and equal Skin: warm and dry  Neuro:  CNs 2-12 intact, no focal abnormalities noted Psych:  Normal affect   LABS Glucose: 190 mg/dL GFR: 27 fO/fpw/8.26 m Troponin: 115, 107, 157 ng/L Hemoglobin: 12.5 g/dL  DIAGNOSTIC EKG: Sinus rhythm with right bundle branch block, unchanged (01/01/2024) Echocardiogram: No wall motion abnormalities (10/2023)  Chemistry Recent Labs  Lab 12/31/23 2326  NA 141  K 3.8  CL 106  CO2 23  GLUCOSE 150*  BUN 46*  CREATININE 2.49*  CALCIUM  9.0  GFRNONAA 27*  ANIONGAP 13     Hematology Recent Labs  Lab 12/31/23 2326  WBC 5.6  RBC 3.96*  HGB 12.5*  HCT 35.4*  MCV 89.4  MCH 31.6  MCHC 35.3  RDW 13.1  PLT 205    Radiology/Studies:  DG Chest Port 1 View Result Date: 12/31/2023 CLINICAL DATA:  Left-sided chest pain following eating popcorn, initial encounter EXAM: PORTABLE CHEST 1 VIEW COMPARISON:  06/25/2018 FINDINGS: The heart size and mediastinal  contours are within normal limits. Both lungs are clear. The visualized skeletal structures are unremarkable. IMPRESSION: No active disease. Electronically Signed   By: [name] M.D.   On: 12/31/2023 23:55   Assessment and Plan:  NSTEMI Coronary artery disease, status post RCA stent with recurrent anginal symptoms - Recurrent anginal symptoms with a history of RCA stent placement in 2016. Symptoms include persistent chest pain and burping, with nitroglycerin  providing partial relief. Troponin levels are elevated but not critically high. Differential includes anginal equivalent or blockage. Risks of  heart catheterization include potential kidney damage due to contrast use, given CKD stage 4, as well as damage to blood vessels, stroke, and open heart surgery, with a low risk of 1 in 3,000. The decision to proceed with catheterization is contingent on symptom persistence and kidney function - he notes that he would prioritize avoiding HD or kidney damage unless something was VERY wrong with his heart.  - Administer isosorbide  mononitrate 30 mg PO daily - Order limited echocardiogram (pending) - Administer IV fluids. - Continue heparin  therapy. - Reassess symptoms and lab results tomorrow. - Consider heart catheterization if symptoms persist or worsen Risks and benefits of cardiac catheterization have been discussed with the patient.  These include bleeding, infection, kidney damage, stroke, heart attack, death.  The patient understands these risks and is willing to proceed if absolutely necessary  Access recommendations: R radial Procedural considerations GFR low; may be diagnostic only   Chronic kidney disease, stage 4 IDDM CKD stage 4 with GFR of 27. Risk of worsening kidney function with contrast use during heart catheterization. He prioritizes avoiding dialysis. Medical management is preferred to avoid further kidney damage. - Administer IV fluids to support kidney function. - Monitor kidney function closely.  Anemia, improved, with history of GI bleeding Anemia improved with current hemoglobin at 12.5, down from 13.3 but improved from past levels of 8-9 during significant GI bleed.  No change in therapy save for the above.  His two family members are presents and all questions were answered.  His sister is a nephrology nurse and joins us  virtually    Risk Assessment/Risk Scores:    TIMI Risk Score for Unstable Angina or Non-ST Elevation MI:   The patient's TIMI risk score is 5, which indicates a 26% risk of all cause mortality, new or recurrent myocardial infarction or need for  urgent revascularization in the next 14 days.    For questions or updates, please contact [facility] Please consult [email or web address] for contact info under    Stanly Leavens, MD FASE [facility] Cardiologist [facility]  [address] [place], [place] [address] [phone]  6:45 PM

## 2024-01-01 NOTE — ED Notes (Signed)
 [facility] at bedside

## 2024-01-01 NOTE — Plan of Care (Signed)

## 2024-01-01 NOTE — ED Notes (Signed)
 Dr. Roselyn aware of Troponin 119.

## 2024-01-01 NOTE — ED Notes (Signed)
 Called [facility] for Transport to [facility] @14 :10.  Spoke with Luke.

## 2024-01-01 NOTE — Progress Notes (Signed)
 PHARMACY - ANTICOAGULATION CONSULT NOTE  Pharmacy Consult for heparin  Indication: chest pain/ACS  Allergies  Allergen Reactions   Peanut (Diagnostic) Anaphylaxis and Swelling   Peanut Oil Anaphylaxis and Swelling   Niaspan [Niacin] Other (See Comments)    Unknown reaction    Zestril [Lisinopril] Other (See Comments) and Cough    Increased BUN and creatinine      Patient Measurements: Height: 5' 11 (180.3 cm) Weight: 97.5 kg (215 lb) IBW/kg (Calculated) : 75.3 HEPARIN  DW (KG): 95.1  Vital Signs: Temp: 98.2 F (36.8 C) (08/25 1100) Temp Source: Oral (08/25 1100) BP: 136/93 (08/25 1130) Pulse Rate: 96 (08/25 1130)  Labs: Recent Labs    12/31/23 2326  HGB 12.5*  HCT 35.4*  PLT 205  CREATININE 2.49*    Estimated Creatinine Clearance: 33.8 mL/min (A) (by C-G formula based on SCr of 2.49 mg/dL (H)).   Medical History: Past Medical History:  Diagnosis Date   CAD (coronary artery disease)    a. 11/2014 Cath/PCI: LM nl, LAD 50p, 60d, RI 40, LCX small, OM1 40, OM2 40, RCA 95d (2.25x20 Promus Premier DES), EF nl.   CKD (chronic kidney disease), stage III (HCC)    Diabetes mellitus (HCC)    Esophageal ulcer    Essential hypertension    GI bleed 06/2018   a. melena/ABL anemia with EGD on 06/27/18 with healed esophageal ulcer, esophagitis, hiatal hernia.   Hyperlipidemia    Sleep apnea     Medications:  Infusions:   heparin       Assessment: 68 yom presented to the ED with CP. Troponin now trending up and now starting IV heparin . Baseline CBC is ok and he is not on anticoagulation PTA.   Goal of Therapy:  Heparin  level 0.3-0.7 units/ml Monitor platelets by anticoagulation protocol: Yes   Plan:  Heparin  bolus 4000 units IV x 1 Heparin  gtt 1200 units/hr Check a 6 hr heparin  level Daily heparin  level and CBC  Gabbriella Presswood, Vernell Helling 01/01/2024,2:37 PM

## 2024-01-01 NOTE — ED Provider Notes (Signed)
 Patient remains here in the ED.  Remains mostly asymptomatic.  Maybe had some GERD related epigastric pain earlier but has since resolved after Tums.  Did repeat troponin as well as EKG.  No EKG changes.  Troponin uptrending.  4, will start patient on heparin  while patient still waiting for transport to other facility.   Simon Lavonia SAILOR, MD 01/01/24 [id number]

## 2024-01-01 NOTE — H&P (Signed)
 History and Physical    Hayden Williams FMW:[id number] DOB: 10/19/1955 DOA: 12/31/2023  PCP: Lazoff, Shawn P, DO  Patient coming from: home  I have personally briefly reviewed patient's old medical records in [facility]  Chief Complaint: chest pain/heartburn  HPI: Hayden Williams is Hayden Williams 68 y.o. male with medical history significant of CAD s/p DES 2016, T2DM, CKD IV, GI bleed, dyslipidemia and other medical issues here with chest discomfort.  His symptoms started last night during [employer] movie.  He notes about 20 min before the end he developed what he describes as heartburn.  Located substernal.  No radiation.  It was persistent, it wouldn't go away.  He took Micaila Ziemba nitroglycerin  and that didn't eliminate it, but it made it tolerable.  He left the movie and he and his brother went straight to [facility].  He received additional nitroglycerin  with EMS with some improvement in his discomfort, but it was not eliminated.  No nausea, vomiting, numbness, tingling, diaphoresis.  No smoking.  Occasional etoh.  Notes he still has the discomfort, describes as 1/10 right now.   ED Course: labs, imaging.  Admit to hospitalist.  Heparin  gtt started.    Review of Systems: As per HPI otherwise all other systems reviewed and are negative.  Past Medical History:  Diagnosis Date   CAD (coronary artery disease)    Hayden Williams. 11/2014 Cath/PCI: LM nl, LAD 50p, 60d, RI 40, LCX small, OM1 40, OM2 40, RCA 95d (2.25x20 Promus Premier DES), EF nl.   CKD (chronic kidney disease), stage III (HCC)    Diabetes mellitus (HCC)    Esophageal ulcer    Essential hypertension    GI bleed 06/2018   Hayden Williams. melena/ABL anemia with EGD on 06/27/18 with healed esophageal ulcer, esophagitis, hiatal hernia.   Hyperlipidemia    Sleep apnea     Past Surgical History:  Procedure Laterality Date   ANKLE SURGERY Right    CARDIAC CATHETERIZATION N/Hayden Williams 12/02/2014   Procedure: Left Heart Cath and Coronary Angiography;  Surgeon: Lonni JONETTA Cash, MD;   Location: [facility] INVASIVE CV LAB;  Service: Cardiovascular;  Laterality: N/Cartina Brousseau;   CARDIAC CATHETERIZATION N/Hayden Williams 12/02/2014   Procedure: Coronary Stent Intervention;  Surgeon: Lonni JONETTA Cash, MD;  Location: [facility] INVASIVE CV LAB;  Service: Cardiovascular;  Laterality: N/Tyriana Helmkamp;   CATARACT EXTRACTION     CORONARY ANGIOPLASTY WITH STENT PLACEMENT     ESOPHAGOGASTRODUODENOSCOPY (EGD) WITH PROPOFOL  N/Hayden Williams 06/27/2018   Procedure: ESOPHAGOGASTRODUODENOSCOPY (EGD) WITH PROPOFOL ;  Surgeon: Hayden Gwendlyn DASEN, MD;  Location: [facility] ENDOSCOPY;  Service: Endoscopy;  Laterality: N/Hayden Williams;   EYE SURGERY     INTRAOCULAR PROSTHESES INSERTION      Social History  reports that he has never smoked. He has never used smokeless tobacco. He reports that he does not drink alcohol and does not use drugs.  Allergies  Allergen Reactions   Peanut (Diagnostic) Anaphylaxis and Swelling   Peanut Oil Anaphylaxis and Swelling   Niaspan [Niacin] Other (See Comments)    Unknown reaction    Zestril [Lisinopril] Other (See Comments) and Cough    Increased BUN and creatinine      Family History  Problem Relation Age of Onset   Diabetes Mother    Heart disease Mother    Sleep apnea Mother    Cancer Father    Diabetes Father    Diabetes Sister    Cancer Brother    Diabetes Brother      Prior to Admission medications   Medication Sig  Start Date End Date Taking? Authorizing Provider  allopurinol  (ZYLOPRIM ) 100 MG tablet Take 100 mg by mouth 2 (two) times daily. 09/03/21  Yes [provider]  amLODipine  (NORVASC ) 10 MG tablet Take 10 mg by mouth every evening. 02/23/15  Yes [provider]  aspirin  EC 81 MG tablet Take 1 tablet (81 mg total) by mouth daily. To start in 4 weeks once Plavix  and Brillinta course completed 11/24/23  Yes Fairy Frames, MD  atorvastatin  (LIPITOR ) 80 MG tablet TAKE 1 TABLET BY MOUTH DAILY 06/02/23  Yes Verlin Lonni BIRCH, MD  Cholecalciferol (VITAMIN D-3 PO) Take 1 capsule by mouth  daily.   Yes [provider]  Dulaglutide  (TRULICITY ) 4.5 MG/0.5ML SOAJ Inject 4.5 mg into the skin every Monday.   Yes [provider]  ezetimibe  (ZETIA ) 10 MG tablet Take 1 tablet (10 mg total) by mouth daily. 10/27/23 10/26/24 Yes Fairy Frames, MD  insulin  aspart (NOVOLOG  FLEXPEN) 100 UNIT/ML FlexPen Inject 3-10 Units into the skin See admin instructions. Inject 3-10 units three times daily before each meal, per sliding scale: Under 100: 3 units 100 - 150 : 5 units 151 - 200 : 7 units 201 - 250 : 8 units 251 - 300 : 9 units Over 300 : 10 units   Yes [provider]  insulin  glargine, 1 Unit Dial , (TOUJEO ) 300 UNIT/ML Solostar Pen Inject 44 Units into the skin daily. 01/11/19  Yes [provider]  IRON-VITAMIN C  PO Take 1 tablet by mouth daily.   Yes [provider]  metoprolol  tartrate (LOPRESSOR ) 100 MG tablet TAKE 1 TABLET BY MOUTH 2 TIMES Hayden Williams DAY 06/06/23  Yes Verlin Lonni BIRCH, MD  MOUNJARO 10 MG/0.5ML Pen Inject 10 mg into the skin once Hayden Williams week. 12/31/23  Yes [provider]  nitroGLYCERIN  (NITROSTAT ) 0.4 MG SL tablet Place 1 tablet (0.4 mg total) under the tongue every 5 (five) minutes as needed for chest pain. 07/18/23  Yes Duke, Jon Garre, PA  pantoprazole  (PROTONIX ) 40 MG tablet Take 1 tablet (40 mg total) by mouth daily. 05/18/21  Yes Verlin Lonni BIRCH, MD  tamsulosin  (FLOMAX ) 0.4 MG CAPS capsule Take 0.4 mg by mouth daily.   Yes [provider]  valsartan -hydrochlorothiazide  (DIOVAN -HCT) 160-25 MG tablet Take 1 tablet by mouth daily. 05/18/21  Yes Verlin Lonni BIRCH, MD    Physical Exam: Vitals:   01/01/24 1100 01/01/24 1130 01/01/24 1447 01/01/24 1549  BP: (!) 145/94 (!) 136/93 (!) 145/98 (!) 127/92  Pulse: 96 96 99 99  Resp: 17 14 16 17   Temp: 98.2 F (36.8 C)  97.9 F (36.6 C) 98.2 F (36.8 C)  TempSrc: Oral  Oral   SpO2: 98% 99% 99% 100%  Weight:      Height:        Constitutional: NAD,  calm, comfortable Vitals:   01/01/24 1100 01/01/24 1130 01/01/24 1447 01/01/24 1549  BP: (!) 145/94 (!) 136/93 (!) 145/98 (!) 127/92  Pulse: 96 96 99 99  Resp: 17 14 16 17   Temp: 98.2 F (36.8 C)  97.9 F (36.6 C) 98.2 F (36.8 C)  TempSrc: Oral  Oral   SpO2: 98% 99% 99% 100%  Weight:      Height:       Eyes: PERRL, lids and conjunctivae normal ENMT: Mucous membranes are moist.  Neck: normal, supple Respiratory: unlabored Cardiovascular: RRR Abdomen: protuberant, nontender Musculoskeletal: no clubbing / cyanosis. No joint deformity upper and lower extremities. Good ROM, no contractures. Normal muscle tone.  Skin: no rashes, lesions, ulcers. No induration Neurologic: CN 2-12 grossly intact. Sensation intact, DTR normal. Strength 5/5 in all 4.  Psychiatric: Normal judgment and insight. Alert and oriented x 3. Normal mood.   Labs on Admission: I have personally reviewed following labs and imaging studies  CBC: Recent Labs  Lab 12/31/23 2326  WBC 5.6  NEUTROABS 3.1  HGB 12.5*  HCT 35.4*  MCV 89.4  PLT 205    Basic Metabolic Panel: Recent Labs  Lab 12/31/23 2326  NA 141  K 3.8  CL 106  CO2 23  GLUCOSE 150*  BUN 46*  CREATININE 2.49*  CALCIUM  9.0    GFR: Estimated Creatinine Clearance: 33.8 mL/min (Carlina Derks) (by C-G formula based on SCr of 2.49 mg/dL (H)).  Liver Function Tests: No results for input(s): AST, ALT, ALKPHOS, BILITOT, PROT, ALBUMIN in the last 168 hours.  Urine analysis:    Component Value Date/Time   COLORURINE YELLOW 12/02/2014 1219   APPEARANCEUR CLEAR 12/02/2014 1219   LABSPEC 1.026 12/02/2014 1219   PHURINE 5.5 12/02/2014 1219   GLUCOSEU 250 (Rondo Spittler) 12/02/2014 1219   HGBUR MODERATE (Tekoa Amon) 12/02/2014 1219   BILIRUBINUR NEGATIVE 12/02/2014 1219   KETONESUR NEGATIVE 12/02/2014 1219   PROTEINUR >300 (Shira Bobst) 12/02/2014 1219   UROBILINOGEN 0.2 12/02/2014 1219   NITRITE NEGATIVE 12/02/2014 1219   LEUKOCYTESUR NEGATIVE 12/02/2014 1219     Radiological Exams on Admission: DG Chest Port 1 View Result Date: 12/31/2023 CLINICAL DATA:  Left-sided chest pain following eating popcorn, initial encounter EXAM: PORTABLE CHEST 1 VIEW COMPARISON:  06/25/2018 FINDINGS: The heart size and mediastinal contours are within normal limits. Both lungs are clear. The visualized skeletal structures are unremarkable. IMPRESSION: No active disease. Electronically Signed   By: [name] M.D.   On: 12/31/2023 23:55    EKG: Independently reviewed. Sinus, RBBB  Assessment/Plan Principal Problem:   Chest pain    Assessment and Plan:  Atypical Chest Pain Elevated Troponin  CAD s/p DES 2016 Pain partially relieved by nitroglycerin , substernal.  Described as feeling like heartburn.  No reported worsening with activity. Troponins rising, will trend EKG with chronic RBBB Was started on heparin  gtt by EDP.  Continue ASA.  Continue home metoprolol , lipitor .  Will consult cardiology A1c, lipid panel.  Limited echo for wall motion.   CKD IV Creatinine appears to be close to baseline Hold valsartan  hydrochlorothiazide  for now Needs to follow with renal outpatient   History of Stroke Discharged 10/2023 on plavix /brilinta  x4 weeks followed by ASA alone  T2DM Basal, SSI  Adjust as needed (reduced dose basal for now)  Dyslipidemia Statin, zetia   Gout Allopurinol   Anemia Mild, trend    DVT prophylaxis: Heparin  gtt  Code Status:   full  Family Communication:  Son at bedside  Disposition Plan:   Patient is from:  home  Anticipated DC to:  home  Anticipated DC date:  Pending   Anticipated DC barriers: Pending cardiology clearance, improvement in his sx  Consults called:  cards  Admission status:  obs   Severity of Illness: The appropriate patient status for this patient is OBSERVATION. Observation status is judged to be reasonable and necessary in order to provide the required intensity of service to ensure the patient's safety.  The patient's presenting symptoms, physical exam findings, and initial radiographic and laboratory data in the context of their medical condition is felt to place them at decreased risk for further clinical deterioration. Furthermore, it is anticipated that the patient will be medically stable  for discharge from the hospital within 2 midnights of admission.     [name] MD [facility]  How to contact the [facility] Attending or Consulting provider 7A - 7P or covering provider during after hours 7P -7A, for this patient?   Check the care team in [facility] and look for Leyana Whidden) attending/consulting [facility] provider listed and b) the [facility] team listed Log into [email or web address] and use [place]'s universal password to access. If you do not have the password, please contact the hospital operator. Locate the [facility] provider you are looking for under [facility] and page to Brandon Scarbrough number that you can be directly reached. If you still have difficulty reaching the provider, please page the [facility] (Director on Call) for the Hospitalists listed on amion for assistance.  01/01/2024, 5:17 PM

## 2024-01-02 ENCOUNTER — Observation Stay (HOSPITAL_COMMUNITY): Payer: Medicare (Managed Care)

## 2024-01-02 DIAGNOSIS — N184 Chronic kidney disease, stage 4 (severe): Secondary | ICD-10-CM | POA: Diagnosis present

## 2024-01-02 DIAGNOSIS — I252 Old myocardial infarction: Secondary | ICD-10-CM | POA: Diagnosis not present

## 2024-01-02 DIAGNOSIS — I5042 Chronic combined systolic (congestive) and diastolic (congestive) heart failure: Secondary | ICD-10-CM | POA: Diagnosis present

## 2024-01-02 DIAGNOSIS — I4510 Unspecified right bundle-branch block: Secondary | ICD-10-CM | POA: Diagnosis present

## 2024-01-02 DIAGNOSIS — Z8673 Personal history of transient ischemic attack (TIA), and cerebral infarction without residual deficits: Secondary | ICD-10-CM | POA: Diagnosis not present

## 2024-01-02 DIAGNOSIS — M109 Gout, unspecified: Secondary | ICD-10-CM | POA: Diagnosis present

## 2024-01-02 DIAGNOSIS — E785 Hyperlipidemia, unspecified: Secondary | ICD-10-CM | POA: Diagnosis present

## 2024-01-02 DIAGNOSIS — E662 Morbid (severe) obesity with alveolar hypoventilation: Secondary | ICD-10-CM | POA: Diagnosis present

## 2024-01-02 DIAGNOSIS — Z794 Long term (current) use of insulin: Secondary | ICD-10-CM | POA: Diagnosis not present

## 2024-01-02 DIAGNOSIS — I214 Non-ST elevation (NSTEMI) myocardial infarction: Secondary | ICD-10-CM

## 2024-01-02 DIAGNOSIS — Z7985 Long-term (current) use of injectable non-insulin antidiabetic drugs: Secondary | ICD-10-CM | POA: Diagnosis not present

## 2024-01-02 DIAGNOSIS — H5461 Unqualified visual loss, right eye, normal vision left eye: Secondary | ICD-10-CM | POA: Diagnosis present

## 2024-01-02 DIAGNOSIS — N400 Enlarged prostate without lower urinary tract symptoms: Secondary | ICD-10-CM | POA: Diagnosis present

## 2024-01-02 DIAGNOSIS — E66811 Obesity, class 1: Secondary | ICD-10-CM | POA: Diagnosis present

## 2024-01-02 DIAGNOSIS — I130 Hypertensive heart and chronic kidney disease with heart failure and stage 1 through stage 4 chronic kidney disease, or unspecified chronic kidney disease: Secondary | ICD-10-CM | POA: Diagnosis present

## 2024-01-02 DIAGNOSIS — Y828 Other medical devices associated with adverse incidents: Secondary | ICD-10-CM | POA: Diagnosis present

## 2024-01-02 DIAGNOSIS — E1122 Type 2 diabetes mellitus with diabetic chronic kidney disease: Secondary | ICD-10-CM | POA: Diagnosis present

## 2024-01-02 DIAGNOSIS — D631 Anemia in chronic kidney disease: Secondary | ICD-10-CM | POA: Diagnosis present

## 2024-01-02 DIAGNOSIS — Z8249 Family history of ischemic heart disease and other diseases of the circulatory system: Secondary | ICD-10-CM | POA: Diagnosis not present

## 2024-01-02 DIAGNOSIS — R7989 Other specified abnormal findings of blood chemistry: Secondary | ICD-10-CM | POA: Diagnosis not present

## 2024-01-02 DIAGNOSIS — I21A9 Other myocardial infarction type: Secondary | ICD-10-CM | POA: Diagnosis present

## 2024-01-02 DIAGNOSIS — I2510 Atherosclerotic heart disease of native coronary artery without angina pectoris: Secondary | ICD-10-CM | POA: Diagnosis present

## 2024-01-02 DIAGNOSIS — I255 Ischemic cardiomyopathy: Secondary | ICD-10-CM | POA: Diagnosis present

## 2024-01-02 DIAGNOSIS — R079 Chest pain, unspecified: Secondary | ICD-10-CM | POA: Diagnosis present

## 2024-01-02 DIAGNOSIS — Z79899 Other long term (current) drug therapy: Secondary | ICD-10-CM | POA: Diagnosis not present

## 2024-01-02 DIAGNOSIS — Z5941 Food insecurity: Secondary | ICD-10-CM | POA: Diagnosis not present

## 2024-01-02 DIAGNOSIS — Z833 Family history of diabetes mellitus: Secondary | ICD-10-CM | POA: Diagnosis not present

## 2024-01-02 DIAGNOSIS — T82855A Stenosis of coronary artery stent, initial encounter: Secondary | ICD-10-CM | POA: Diagnosis present

## 2024-01-02 LAB — CBC
HCT: 32 % — ABNORMAL LOW (ref 39.0–52.0)
Hemoglobin: 11.4 g/dL — ABNORMAL LOW (ref 13.0–17.0)
MCH: 31.9 pg (ref 26.0–34.0)
MCHC: 35.6 g/dL (ref 30.0–36.0)
MCV: 89.6 fL (ref 80.0–100.0)
Platelets: 204 K/uL (ref 150–400)
RBC: 3.57 MIL/uL — ABNORMAL LOW (ref 4.22–5.81)
RDW: 12.8 % (ref 11.5–15.5)
WBC: 7.9 K/uL (ref 4.0–10.5)
nRBC: 0 % (ref 0.0–0.2)

## 2024-01-02 LAB — TROPONIN I (HIGH SENSITIVITY)
Troponin I (High Sensitivity): 5190 ng/L (ref <18)
Troponin I (High Sensitivity): 4671 ng/L (ref <18)

## 2024-01-02 LAB — GLUCOSE, CAPILLARY
Glucose-Capillary: 185 mg/dL — ABNORMAL HIGH (ref 70–99)
Glucose-Capillary: 127 mg/dL — ABNORMAL HIGH (ref 70–99)
Glucose-Capillary: 120 mg/dL — ABNORMAL HIGH (ref 70–99)
Glucose-Capillary: 200 mg/dL — ABNORMAL HIGH (ref 70–99)
Glucose-Capillary: 252 mg/dL — ABNORMAL HIGH (ref 70–99)

## 2024-01-02 LAB — COMPREHENSIVE METABOLIC PANEL WITH GFR
ALT: 20 U/L (ref 0–44)
AST: 41 U/L (ref 15–41)
Albumin: 2.9 g/dL — ABNORMAL LOW (ref 3.5–5.0)
Alkaline Phosphatase: 54 U/L (ref 38–126)
Anion gap: 8 (ref 5–15)
BUN: 28 mg/dL — ABNORMAL HIGH (ref 8–23)
CO2: 24 mmol/L (ref 22–32)
Calcium: 8.4 mg/dL — ABNORMAL LOW (ref 8.9–10.3)
Chloride: 104 mmol/L (ref 98–111)
Creatinine, Ser: 1.84 mg/dL — ABNORMAL HIGH (ref 0.61–1.24)
GFR, Estimated: 39 mL/min — ABNORMAL LOW (ref >60)
Glucose, Bld: 149 mg/dL — ABNORMAL HIGH (ref 70–99)
Potassium: 4 mmol/L (ref 3.5–5.1)
Sodium: 136 mmol/L (ref 135–145)
Total Bilirubin: 0.6 mg/dL (ref 0.0–1.2)
Total Protein: 6 g/dL — ABNORMAL LOW (ref 6.5–8.1)

## 2024-01-02 LAB — LIPID PANEL
Cholesterol: 71 mg/dL (ref 0–200)
HDL: 27 mg/dL — ABNORMAL LOW (ref >40)
LDL Cholesterol: 22 mg/dL (ref 0–99)
Total CHOL/HDL Ratio: 2.6 ratio
Triglycerides: 110 mg/dL (ref <150)
VLDL: 22 mg/dL (ref 0–40)

## 2024-01-02 LAB — ECHOCARDIOGRAM LIMITED
AR max vel: 3.26 cm2
AV Peak grad: 3.9 mmHg
Ao pk vel: 0.99 m/s
Area-P 1/2: 4.06 cm2
Est EF: 50
Height: 71 in
S' Lateral: 3 cm
Weight: 3440 [oz_av]

## 2024-01-02 LAB — HEMOGLOBIN A1C
Hgb A1c MFr Bld: 7.3 % — ABNORMAL HIGH (ref 4.8–5.6)
Mean Plasma Glucose: 162.81 mg/dL

## 2024-01-02 LAB — HEPARIN LEVEL (UNFRACTIONATED): Heparin Unfractionated: 0.56 [IU]/mL (ref 0.30–0.70)

## 2024-01-02 MED ORDER — SODIUM CHLORIDE 0.9 % IV SOLN: INTRAVENOUS | Status: DC

## 2024-01-02 MED ORDER — SODIUM CHLORIDE 0.9 % IV BOLUS
1000.0000 mL | Freq: Once | INTRAVENOUS | Status: AC
  Administered 2024-01-02: 1000 mL via INTRAVENOUS

## 2024-01-02 MED ORDER — ASPIRIN 81 MG PO CHEW
81.0000 mg | CHEWABLE_TABLET | ORAL | Status: AC
  Administered 2024-01-03: 81 mg via ORAL
  Filled 2024-01-02: qty 1

## 2024-01-02 NOTE — Progress Notes (Signed)
 Progress Note  Patient Name: Hayden Williams Date of Encounter: 01/02/2024 Primary Cardiologist: Lonni Cash, MD   Subjective   Overnight with IVF creatinine and GFR has both improved. Patient notes resolution of chest pain. An echo is pending.  Troponin has significantly elevated.  Vital Signs    Vitals:   01/01/24 2041 01/01/24 2310 01/02/24 0321 01/02/24 0851  BP: (!) 140/94 114/72 123/75 (!) 115/51  Pulse: 93 85 84 81  Resp:  18 18   Temp: 97.6 F (36.4 C) 98.5 F (36.9 C) 98.2 F (36.8 C) 98 F (36.7 C)  TempSrc:      SpO2: 99% 97% 93% 97%  Weight:      Height:        Intake/Output Summary (Last 24 hours) at 01/02/2024 0909 Last data filed at 01/01/2024 1636 Gross per 24 hour  Intake 61.59 ml  Output --  Net 61.59 ml   Filed Weights   12/31/23 2316  Weight: 97.5 kg    Physical Exam   GEN: No acute distress.   Neck: No JVD Cardiac: RRR, no murmurs, rubs, or gallops.  Respiratory: Clear to auscultation bilaterally. GI: Soft, nontender, non-distended  MS: No edema  Labs     Chemistry Recent Labs  Lab 12/31/23 2326 01/02/24 0423  NA 141 136  K 3.8 4.0  CL 106 104  CO2 23 24  GLUCOSE 150* 149*  BUN 46* 28*  CREATININE 2.49* 1.84*  CALCIUM  9.0 8.4*  PROT  --  6.0*  ALBUMIN  --  2.9*  AST  --  41  ALT  --  20  ALKPHOS  --  54  BILITOT  --  0.6  GFRNONAA 27* 39*  ANIONGAP 13 8     Hematology Recent Labs  Lab 12/31/23 2326 01/02/24 0423  WBC 5.6 7.9  RBC 3.96* 3.57*  HGB 12.5* 11.4*  HCT 35.4* 32.0*  MCV 89.4 89.6  MCH 31.6 31.9  MCHC 35.3 35.6  RDW 13.1 12.8  PLT 205 204     Cardiac Studies   Cardiac Studies & Procedures   ______________________________________________________________________________________________ CARDIAC CATHETERIZATION  CARDIAC CATHETERIZATION 12/02/2014  Conclusion  Post Atrio lesion, 50% stenosed.  1st Mrg lesion, 40% stenosed.  2nd Mrg lesion, 40% stenosed.  Ramus lesion, 40%  stenosed.  Prox LAD to Mid LAD lesion, 50% stenosed.  Dist LAD lesion, 60% stenosed.  Dist RCA lesion, 95% stenosed. There is a 0% residual stenosis post intervention.  A drug-eluting stent was placed.  The left ventricular systolic function is normal.  1. Severe stenosis distal RCA, NSTEMI culprit lesion 2. Successful PTCA/DES x 1 distal RCA 3. Diffuse small vessel disease in the small distal LAD and small obtuse marginal branches. 4. Normal LV systolic function  Recommendations: Will continue ASA and Plavix  for at least one year. Will start statin and beta blocker.  Findings Coronary Findings Diagnostic  Dominance: Right  Left Anterior Descending  Ramus Intermedius  Left Circumflex The vessel is small .  First Obtuse Marginal Branch The vessel is small in size.  Second Obtuse Marginal Branch The vessel is small in size.  Right Coronary Artery discrete .  Right Posterior Atrioventricular Artery discrete located at the bend .  Intervention  Dist RCA lesion PCI The pre-interventional distal flow is normal (TIMI 3). Pre-stent angioplasty was performed. A drug-eluting stent was placed. Minimum lumen area: 2.5 mm. The strut is apposed. Post-stent angioplasty was performed. Maximum pressure: 15 atm. The post-interventional distal flow is normal (  TIMI 3). The intervention was successful. No complications occurred at this lesion. Pressure wire/FFR was not performed on the lesion. There is a 0% residual stenosis post intervention.   STRESS TESTS  MYOCARDIAL PERFUSION IMAGING 06/23/2017  Interpretation Summary  The left ventricular ejection fraction is normal (55-65%).  Nuclear stress EF: 63%.  Blood pressure demonstrated a normal response to exercise.  No T wave inversion was noted during stress.  There was no ST segment deviation noted during stress.  The study is normal.  This is a low risk study.  Normal perfusion. LVEF 63% with normal wall motion.  This is a low risk study.   ECHOCARDIOGRAM  ECHOCARDIOGRAM COMPLETE 10/26/2023  Narrative ECHOCARDIOGRAM REPORT    Patient Name:   Hayden Williams Date of Exam: 10/26/2023 Medical Rec #:  [id number]      Height:       71.0 in Accession #:    [id number]     Weight:       215.0 lb Date of Birth:  12/12/1955      BSA:          2.174 m Patient Age:    67 years       BP:           150/96 mmHg Patient Gender: M              HR:           81 bpm. Exam Location:  Inpatient  Procedure: Cardiac Doppler and Color Doppler (Both Spectral and Color Flow Doppler were utilized during procedure).  Indications:    CAD  History:        Patient has no prior history of Echocardiogram examinations.  Sonographer:    Therisa Crouch Referring Phys: [id number] [facility]  IMPRESSIONS   1. Left ventricular ejection fraction, by estimation, is 70 to 75%. The left ventricle has hyperdynamic function. The left ventricle has no regional wall motion abnormalities. There is mild concentric left ventricular hypertrophy. Left ventricular diastolic parameters are consistent with Grade I diastolic dysfunction (impaired relaxation). 2. Right ventricular systolic function is normal. The right ventricular size is normal. 3. The mitral valve is normal in structure. Trivial mitral valve regurgitation. No evidence of mitral stenosis. 4. The aortic valve is normal in structure. Aortic valve regurgitation is trivial. No aortic stenosis is present. 5. The inferior vena cava is normal in size with greater than 50% respiratory variability, suggesting right atrial pressure of 3 mmHg.  FINDINGS Left Ventricle: Left ventricular ejection fraction, by estimation, is 70 to 75%. The left ventricle has hyperdynamic function. The left ventricle has no regional wall motion abnormalities. The left ventricular internal cavity size was normal in size. There is mild concentric left ventricular hypertrophy. Left ventricular diastolic  parameters are consistent with Grade I diastolic dysfunction (impaired relaxation).  Right Ventricle: The right ventricular size is normal. No increase in right ventricular wall thickness. Right ventricular systolic function is normal.  Left Atrium: Left atrial size was normal in size.  Right Atrium: Right atrial size was normal in size.  Pericardium: There is no evidence of pericardial effusion.  Mitral Valve: The mitral valve is normal in structure. Trivial mitral valve regurgitation. No evidence of mitral valve stenosis.  Tricuspid Valve: The tricuspid valve is normal in structure. Tricuspid valve regurgitation is trivial. No evidence of tricuspid stenosis.  Aortic Valve: The aortic valve is normal in structure. Aortic valve regurgitation is trivial. No aortic stenosis is present.  Pulmonic Valve:  The pulmonic valve was normal in structure. Pulmonic valve regurgitation is trivial. No evidence of pulmonic stenosis.  Aorta: The aortic root is normal in size and structure.  Venous: The inferior vena cava is normal in size with greater than 50% respiratory variability, suggesting right atrial pressure of 3 mmHg.  IAS/Shunts: No atrial level shunt detected by color flow Doppler.   LEFT VENTRICLE PLAX 2D LVIDd:         4.60 cm   Diastology LVIDs:         2.70 cm   LV e' medial:    8.08 cm/s LV PW:         0.90 cm   LV E/e' medial:  6.8 LV IVS:        0.80 cm   LV e' lateral:   7.15 cm/s LVOT diam:     2.40 cm   LV E/e' lateral: 7.7 LVOT Area:     4.52 cm   RIGHT VENTRICLE             IVC RV S prime:     17.10 cm/s  IVC diam: 1.40 cm TAPSE (M-mode): 2.8 cm  LEFT ATRIUM             Index LA diam:        3.50 cm 1.61 cm/m LA Vol (A2C):   33.3 ml 15.31 ml/m LA Vol (A4C):   55.0 ml 25.29 ml/m LA Biplane Vol: 44.8 ml 20.60 ml/m  AORTA Ao Root diam: 3.70 cm Ao Asc diam:  2.80 cm  MITRAL VALVE MV Area (PHT): 3.83 cm    SHUNTS MV Decel Time: 198 msec    Systemic Diam:  2.40 cm MV E velocity: 55.00 cm/s MV A velocity: 89.30 cm/s MV E/A ratio:  0.62  Toribio Fuel MD Electronically signed by Toribio Fuel MD Signature Date/Time: 10/26/2023/3:43:15 PM    Final          ______________________________________________________________________________________________        Assessment & Plan    NSTEMI Coronary artery disease, status post RCA stent with recurrent anginal symptoms - with HTN on norvasc  - on ASA, statin and zetia  with LDL < 55 - on metoprolol  and imdur  - limited echo is pending - if no intervention will start DAPT with plavix   Risks and benefits of cardiac catheterization have been discussed with the patient.  These include bleeding, infection, kidney damage, stroke, heart attack, death.  The patient understands these risks and is willing to proceed.  Access recommendations: R rad Procedural considerations: GFR is best it has been; he feels comfortable with proceeding, I suspect his anemia is dilutional   Chronic kidney disease, stage 4 IDDM Anemia, improved, with history of GI bleeding Anemia improved with current hemoglobin at 12 to 11 that may be dilutional from IVF  - A1c down from 7.7 to 7.3; discussed the mediterranean diet    For questions or updates, please contact [facility] Please consult [email or web address] for contact info under Cardiology/STEMI.      Stanly Leavens, MD FASE [facility] Cardiologist [facility]  [address] [place], [address] [place], [place] [address] [phone]  9:09 AM

## 2024-01-02 NOTE — Plan of Care (Signed)
  Problem: Education: Goal: Knowledge of General Education information will improve Description: Including pain rating scale, medication(s)/side effects and non-pharmacologic comfort measures Outcome: Progressing   Problem: Nutrition: Goal: Adequate nutrition will be maintained Outcome: Progressing   Problem: Coping: Goal: Level of anxiety will decrease Outcome: Progressing   Problem: Education: Goal: Ability to describe self-care measures that may prevent or decrease complications (Diabetes Survival Skills Education) will improve Outcome: Progressing

## 2024-01-02 NOTE — Care Management Obs Status (Signed)
 MEDICARE OBSERVATION STATUS NOTIFICATION   Patient Details  Name: Hayden Williams MRN: [id number] Date of Birth: 03-31-1956   Medicare Observation Status Notification Given:  Yes  Verbally reviewed observation notice with Camellia Rattler telephonically at [phone].  A copy will be given to the patient tommorow  Claretta Deed 01/02/2024, 4:27 PM

## 2024-01-02 NOTE — Progress Notes (Addendum)
 PROGRESS NOTE    Hayden Williams  FMW:[id number] DOB: 24-Aug-1955 DOA: 12/31/2023 PCP: Lazoff, Shawn P, DO  Chief Complaint  Patient presents with   Chest Pain    Brief Narrative:    Hayden Williams is Hayden Williams 68 y.o. male with medical history significant of CAD s/Williams DES 2016, T2DM, CKD IV, GI bleed, dyslipidemia and other medical issues here with chest discomfort.   Admitted with an NSTEMI, currently on heparin  gtt.  Cardiology is following.  Assessment & Plan:   Principal Problem:   Chest pain  NSTEMI CAD s/Williams DES 2016 Troponins rising to 1920  EKG with chronic RBBB Continue heparin  gtt, ASA, metoprolol , imdur  Limited echo for wall motion pending A1c (7.3), lipid panel (LDL 22).   Appreciate cardiology recs for next steps   CKD IV Creatinine better than his recent baseline Hold valsartan  hydrochlorothiazide  for now Needs to follow with renal outpatient    History of Stroke Discharged 10/2023 on plavix /brilinta  x4 weeks followed by ASA alone   T2DM Basal, SSI  Adjust as needed (reduced dose basal for now)   Dyslipidemia Statin, zetia    Gout Allopurinol   Anemia Mild, trend    DVT prophylaxis: heparin  gtt Code Status: full Family Communication: none Disposition:   Status is: Observation The patient remains OBS appropriate and will d/c before 2 midnights.   Consultants:  cardiology  Procedures:  none  Antimicrobials:  Anti-infectives (From admission, onward)    None       Subjective: Discomfort went away around midnight No complaints this AM  Objective: Vitals:   01/01/24 1549 01/01/24 2041 01/01/24 2310 01/02/24 0321  BP: (!) 127/92 (!) 140/94 114/72 123/75  Pulse: 99 93 85 84  Resp: 17  18 18   Temp: 98.2 F (36.8 C) 97.6 F (36.4 C) 98.5 F (36.9 C) 98.2 F (36.8 C)  TempSrc:      SpO2: 100% 99% 97% 93%  Weight:      Height:        Intake/Output Summary (Last 24 hours) at 01/02/2024 0815 Last data filed at 01/01/2024 1636 Gross per  24 hour  Intake 61.59 ml  Output --  Net 61.59 ml   Filed Weights   12/31/23 2316  Weight: 97.5 kg    Examination:  General: No acute distress. Cardiovascular: Heart sounds show Hayden Williams regular rate, and rhythm. Lungs: CTAB Neurological: Alert and oriented 3. Moves all extremities 4 with equal strength. Cranial nerves II through XII grossly intact. Extremities: No clubbing or cyanosis. No edema.   Data Reviewed: I have personally reviewed following labs and imaging studies  CBC: Recent Labs  Lab 12/31/23 2326 01/02/24 0423  WBC 5.6 7.9  NEUTROABS 3.1  --   HGB 12.5* 11.4*  HCT 35.4* 32.0*  MCV 89.4 89.6  PLT 205 204    Basic Metabolic Panel: Recent Labs  Lab 12/31/23 2326 01/02/24 0423  NA 141 136  K 3.8 4.0  CL 106 104  CO2 23 24  GLUCOSE 150* 149*  BUN 46* 28*  CREATININE 2.49* 1.84*  CALCIUM  9.0 8.4*    GFR: Estimated Creatinine Clearance: 45.8 mL/min (Hayden Williams) (by C-G formula based on SCr of 1.84 mg/dL (H)).  Liver Function Tests: Recent Labs  Lab 01/02/24 0423  AST 41  ALT 20  ALKPHOS 54  BILITOT 0.6  PROT 6.0*  ALBUMIN 2.9*    CBG: No results for input(s): GLUCAP in the last 168 hours.   No results found for this or any previous visit (  from the past 240 hours).       Radiology Studies: DG Chest Port 1 View Result Date: 12/31/2023 CLINICAL DATA:  Left-sided chest pain following eating popcorn, initial encounter EXAM: PORTABLE CHEST 1 VIEW COMPARISON:  06/25/2018 FINDINGS: The heart size and mediastinal contours are within normal limits. Both lungs are clear. The visualized skeletal structures are unremarkable. IMPRESSION: No active disease. Electronically Signed   By: Hayden Williams M.D.   On: 12/31/2023 23:55        Scheduled Meds:  allopurinol   100 mg Oral BID   alum & mag hydroxide-simeth  30 mL Oral Once   amLODipine   10 mg Oral QPM   aspirin  EC  81 mg Oral Daily   atorvastatin   80 mg Oral Daily   ezetimibe   10 mg Oral Daily    insulin  aspart  0-15 Units Subcutaneous TID WC   insulin  aspart  0-5 Units Subcutaneous QHS   insulin  glargine  25 Units Subcutaneous Daily   isosorbide  mononitrate  30 mg Oral Daily   metoprolol  tartrate  100 mg Oral BID   pantoprazole   40 mg Oral Daily   tamsulosin   0.4 mg Oral Daily   Continuous Infusions:  heparin  1,200 Units/hr (01/02/24 0731)     LOS: 0 days    Time spent: over 30 min    Hayden Monte, MD [facility]   To contact the attending provider between 7A-7P or the covering provider during after hours 7P-7A, please log into the web site [email or web address] and access using universal Hayden Williams password for that web site. If you do not have the password, please call the hospital operator.  01/02/2024, 8:15 AM

## 2024-01-02 NOTE — Progress Notes (Signed)
 PHARMACY - ANTICOAGULATION CONSULT NOTE  Pharmacy Consult for heparin  Indication: chest pain/ACS  Allergies  Allergen Reactions   Peanut (Diagnostic) Anaphylaxis and Swelling   Peanut Oil Anaphylaxis and Swelling   Niaspan [Niacin] Other (See Comments)    Unknown reaction    Zestril [Lisinopril] Other (See Comments) and Cough    Increased BUN and creatinine      Patient Measurements: Height: 5' 11 (180.3 cm) Weight: 97.5 kg (215 lb) IBW/kg (Calculated) : 75.3 HEPARIN  DW (KG): 95.1  Vital Signs: Temp: 98.5 F (36.9 C) (08/25 2310) Temp Source: Oral (08/25 1447) BP: 114/72 (08/25 2310) Pulse Rate: 85 (08/25 2310)  Labs: Recent Labs    12/31/23 2326 01/01/24 1835 01/01/24 2026 01/01/24 2305  HGB 12.5*  --   --   --   HCT 35.4*  --   --   --   PLT 205  --   --   --   HEPARINUNFRC  --   --   --  0.59  CREATININE 2.49*  --   --   --   TROPONINIHS  --  1,844* 1,920*  --     Estimated Creatinine Clearance: 33.8 mL/min (A) (by C-G formula based on SCr of 2.49 mg/dL (H)).   Medical History: Past Medical History:  Diagnosis Date   CAD (coronary artery disease)    a. 11/2014 Cath/PCI: LM nl, LAD 50p, 60d, RI 40, LCX small, OM1 40, OM2 40, RCA 95d (2.25x20 Promus Premier DES), EF nl.   CKD (chronic kidney disease), stage III (HCC)    Diabetes mellitus (HCC)    Esophageal ulcer    Essential hypertension    GI bleed 06/2018   a. melena/ABL anemia with EGD on 06/27/18 with healed esophageal ulcer, esophagitis, hiatal hernia.   Hyperlipidemia    Sleep apnea     Medications:  Infusions:   heparin  1,200 Units/hr (01/01/24 2038)    Assessment: 68 yom presented to the ED with CP. Troponin now trending up and now starting IV heparin . Baseline CBC is ok and he is not on anticoagulation PTA.   8/26 AM update:  Heparin  level therapeutic Trop [id number]  Goal of Therapy:  Heparin  level 0.3-0.7 units/ml Monitor platelets by anticoagulation protocol: Yes   Plan:   Cont heparin  1200 units/hr Heparin  level with AM labs  Lynwood Mckusick, PharmD, BCPS Clinical Pharmacist Phone: [phone]

## 2024-01-02 NOTE — Plan of Care (Signed)
 Patient calm and cooperative A&O X4, family at bedside. Medications tolerated well, patient's dexcom located in left upper arm. Heparin  continued. Provider notified of patient's elevated triponin level. Patient left with call bell in reach, bed in lowest position and side rails up.  Problem: Education: Goal: Knowledge of General Education information will improve Description: Including pain rating scale, medication(s)/side effects and non-pharmacologic comfort measures Outcome: Progressing   Problem: Health Behavior/Discharge Planning: Goal: Ability to manage health-related needs will improve Outcome: Progressing   Problem: Clinical Measurements: Goal: Ability to maintain clinical measurements within normal limits will improve Outcome: Progressing   Problem: Activity: Goal: Risk for activity intolerance will decrease Outcome: Progressing   Problem: Nutrition: Goal: Adequate nutrition will be maintained Outcome: Progressing   Problem: Coping: Goal: Level of anxiety will decrease Outcome: Progressing   Problem: Pain Managment: Goal: General experience of comfort will improve and/or be controlled Outcome: Progressing   Problem: Safety: Goal: Ability to remain free from injury will improve Outcome: Progressing   Problem: Education: Goal: Ability to describe self-care measures that may prevent or decrease complications (Diabetes Survival Skills Education) will improve Outcome: Progressing   Problem: Coping: Goal: Ability to adjust to condition or change in health will improve Outcome: Progressing   Problem: Health Behavior/Discharge Planning: Goal: Ability to identify and utilize available resources and services will improve Outcome: Progressing

## 2024-01-02 NOTE — Progress Notes (Signed)
 Echocardiogram 2D Echocardiogram has been performed.  Hayden Williams 01/02/2024, 10:33 AM

## 2024-01-02 NOTE — Progress Notes (Signed)
 PHARMACY - ANTICOAGULATION CONSULT NOTE  Pharmacy Consult for heparin  Indication: chest pain/ACS  Allergies  Allergen Reactions   Peanut (Diagnostic) Anaphylaxis and Swelling   Peanut Oil Anaphylaxis and Swelling   Niaspan [Niacin] Other (See Comments)    Unknown reaction    Zestril [Lisinopril] Other (See Comments) and Cough    Increased BUN and creatinine      Patient Measurements: Height: 5' 11 (180.3 cm) Weight: 97.5 kg (215 lb) IBW/kg (Calculated) : 75.3 HEPARIN  DW (KG): 95.1  Vital Signs: Temp: 98.2 F (36.8 C) (08/26 0321) BP: 123/75 (08/26 0321) Pulse Rate: 84 (08/26 0321)  Labs: Recent Labs    12/31/23 2326 01/01/24 1835 01/01/24 2026 01/01/24 2305 01/02/24 0423  HGB 12.5*  --   --   --  11.4*  HCT 35.4*  --   --   --  32.0*  PLT 205  --   --   --  204  HEPARINUNFRC  --   --   --  0.59 0.56  CREATININE 2.49*  --   --   --  1.84*  TROPONINIHS  --  1,844* 1,920*  --   --     Estimated Creatinine Clearance: 45.8 mL/min (A) (by C-G formula based on SCr of 1.84 mg/dL (H)).  Assessment: 59 yom presented to the ED with CP. Troponin now trending up and now starting IV heparin . Baseline CBC is ok and he is not on anticoagulation PTA.   Heparin  level therapeutic x2. Hgb with mild trend down 12.5 > 11.4. With history of hospitalization from GIB will reduce goal rate of heparin  and continue to monitor for signs of bleeding.   Goal of Therapy:  Heparin  level 0.3-0.5 units/ml (history of GIB) Monitor platelets by anticoagulation protocol: Yes   Plan:  Empiric decrease heparin  infusion to 1150 units/hr Check heparin  level in 8 hours and daily while on heparin  Continue to monitor H&H and platelets  Thank you for allowing pharmacy to be a part of this patient's care.  Shelba Collier, PharmD, BCPS Clinical Pharmacist

## 2024-01-03 ENCOUNTER — Encounter (HOSPITAL_COMMUNITY)
Admission: EM | Disposition: A | Discharge status: Home / Self Care | Payer: Self-pay | Source: Home / Self Care | Attending: Family Medicine

## 2024-01-03 DIAGNOSIS — R079 Chest pain, unspecified: Secondary | ICD-10-CM | POA: Diagnosis not present

## 2024-01-03 DIAGNOSIS — I2510 Atherosclerotic heart disease of native coronary artery without angina pectoris: Secondary | ICD-10-CM

## 2024-01-03 HISTORY — PX: CORONARY STENT INTERVENTION: CATH118234

## 2024-01-03 HISTORY — PX: LEFT HEART CATH AND CORONARY ANGIOGRAPHY: CATH118249

## 2024-01-03 LAB — HEPARIN LEVEL (UNFRACTIONATED): Heparin Unfractionated: 0.65 [IU]/mL (ref 0.30–0.70)

## 2024-01-03 LAB — BASIC METABOLIC PANEL WITH GFR
Anion gap: 5 (ref 5–15)
BUN: 25 mg/dL — ABNORMAL HIGH (ref 8–23)
CO2: 24 mmol/L (ref 22–32)
Calcium: 8.6 mg/dL — ABNORMAL LOW (ref 8.9–10.3)
Chloride: 109 mmol/L (ref 98–111)
Creatinine, Ser: 1.9 mg/dL — ABNORMAL HIGH (ref 0.61–1.24)
GFR, Estimated: 38 mL/min — ABNORMAL LOW (ref >60)
Glucose, Bld: 139 mg/dL — ABNORMAL HIGH (ref 70–99)
Potassium: 4.1 mmol/L (ref 3.5–5.1)
Sodium: 138 mmol/L (ref 135–145)

## 2024-01-03 LAB — CBC
HCT: 32.9 % — ABNORMAL LOW (ref 39.0–52.0)
Hemoglobin: 11.7 g/dL — ABNORMAL LOW (ref 13.0–17.0)
MCH: 32.1 pg (ref 26.0–34.0)
MCHC: 35.6 g/dL (ref 30.0–36.0)
MCV: 90.4 fL (ref 80.0–100.0)
Platelets: 196 K/uL (ref 150–400)
RBC: 3.64 MIL/uL — ABNORMAL LOW (ref 4.22–5.81)
RDW: 13 % (ref 11.5–15.5)
WBC: 6.9 K/uL (ref 4.0–10.5)
nRBC: 0 % (ref 0.0–0.2)

## 2024-01-03 LAB — GLUCOSE, CAPILLARY
Glucose-Capillary: 135 mg/dL — ABNORMAL HIGH (ref 70–99)
Glucose-Capillary: 241 mg/dL — ABNORMAL HIGH (ref 70–99)

## 2024-01-03 LAB — MAGNESIUM: Magnesium: 1.7 mg/dL (ref 1.7–2.4)

## 2024-01-03 LAB — TROPONIN I (HIGH SENSITIVITY)
Troponin I (High Sensitivity): 2254 ng/L (ref <18)
Troponin I (High Sensitivity): 2506 ng/L (ref <18)

## 2024-01-03 LAB — PHOSPHORUS: Phosphorus: 2 mg/dL — ABNORMAL LOW (ref 2.5–4.6)

## 2024-01-03 LAB — POCT ACTIVATED CLOTTING TIME: Activated Clotting Time: 377 s

## 2024-01-03 MED ORDER — TICAGRELOR 90 MG PO TABS
ORAL_TABLET | ORAL | Status: DC | PRN
  Administered 2024-01-03: 180 mg via ORAL

## 2024-01-03 MED ORDER — MIDAZOLAM HCL 2 MG/2ML IJ SOLN
INTRAMUSCULAR | Status: AC
  Filled 2024-01-03: qty 2

## 2024-01-03 MED ORDER — INSULIN ASPART 100 UNIT/ML IJ SOLN
INTRAMUSCULAR | Status: AC
  Filled 2024-01-03: qty 1

## 2024-01-03 MED ORDER — MIDAZOLAM HCL 2 MG/2ML IJ SOLN
INTRAMUSCULAR | Status: DC | PRN
  Administered 2024-01-03: 1 mg via INTRAVENOUS

## 2024-01-03 MED ORDER — SODIUM CHLORIDE 0.9% FLUSH
3.0000 mL | INTRAVENOUS | Status: DC | PRN
  Administered 2024-01-03: 3 mL via INTRAVENOUS

## 2024-01-03 MED ORDER — VERAPAMIL HCL 2.5 MG/ML IV SOLN
INTRAVENOUS | Status: AC
  Filled 2024-01-03: qty 2

## 2024-01-03 MED ORDER — HEPARIN (PORCINE) IN NACL 1000-0.9 UT/500ML-% IV SOLN
INTRAVENOUS | Status: DC | PRN
  Administered 2024-01-03 (×2): 500 mL via INTRA_ARTERIAL

## 2024-01-03 MED ORDER — IOHEXOL 350 MG/ML SOLN
INTRAVENOUS | Status: DC | PRN
  Administered 2024-01-03: 40 mL via INTRA_ARTERIAL

## 2024-01-03 MED ORDER — HEPARIN SODIUM (PORCINE) 1000 UNIT/ML IJ SOLN
INTRAMUSCULAR | Status: DC | PRN
Start: 2024-01-03 — End: 2024-01-03
  Administered 2024-01-03 (×2): 5000 [IU] via INTRAVENOUS

## 2024-01-03 MED ORDER — TICAGRELOR 90 MG PO TABS
ORAL_TABLET | ORAL | Status: AC
Start: 2024-01-03 — End: 2024-01-03
  Filled 2024-01-03: qty 2

## 2024-01-03 MED ORDER — LIDOCAINE HCL (PF) 1 % IJ SOLN
INTRAMUSCULAR | Status: DC | PRN
Start: 2024-01-03 — End: 2024-01-03
  Administered 2024-01-03: 2 mL

## 2024-01-03 MED ORDER — FENTANYL CITRATE (PF) 100 MCG/2ML IJ SOLN
INTRAMUSCULAR | Status: DC | PRN
  Administered 2024-01-03: 50 ug via INTRAVENOUS

## 2024-01-03 MED ORDER — SODIUM CHLORIDE 0.9 % IV SOLN: 250.0000 mL | INTRAVENOUS | Status: DC | PRN

## 2024-01-03 MED ORDER — SODIUM CHLORIDE 0.9% FLUSH
3.0000 mL | Freq: Two times a day (BID) | INTRAVENOUS | Status: DC
  Administered 2024-01-03: 3 mL via INTRAVENOUS

## 2024-01-03 MED ORDER — FREE WATER: 500.0000 mL | Freq: Once | Status: DC

## 2024-01-03 MED ORDER — HEPARIN SODIUM (PORCINE) 1000 UNIT/ML IJ SOLN
INTRAMUSCULAR | Status: AC
  Filled 2024-01-03: qty 10

## 2024-01-03 MED ORDER — LIDOCAINE HCL (PF) 1 % IJ SOLN
INTRAMUSCULAR | Status: AC
  Filled 2024-01-03: qty 30

## 2024-01-03 MED ORDER — FENTANYL CITRATE (PF) 100 MCG/2ML IJ SOLN
INTRAMUSCULAR | Status: AC
  Filled 2024-01-03: qty 2

## 2024-01-03 MED ORDER — TICAGRELOR 90 MG PO TABS
90.0000 mg | ORAL_TABLET | Freq: Two times a day (BID) | ORAL | Status: DC
  Administered 2024-01-03 – 2024-01-04 (×2): 90 mg via ORAL
  Filled 2024-01-03 (×2): qty 1

## 2024-01-03 MED ORDER — VERAPAMIL HCL 2.5 MG/ML IV SOLN
INTRAVENOUS | Status: DC | PRN
  Administered 2024-01-03: 10 mL via INTRA_ARTERIAL

## 2024-01-03 NOTE — Progress Notes (Signed)
 PHARMACY - ANTICOAGULATION CONSULT NOTE  Pharmacy Consult for heparin  Indication: chest pain/ACS  Allergies  Allergen Reactions   Peanut (Diagnostic) Anaphylaxis and Swelling   Peanut Oil Anaphylaxis and Swelling   Niaspan [Niacin] Other (See Comments)    Unknown reaction    Zestril [Lisinopril] Other (See Comments) and Cough    Increased BUN and creatinine      Patient Measurements: Height: 5' 11 (180.3 cm) Weight: 97.5 kg (215 lb) IBW/kg (Calculated) : 75.3 HEPARIN  DW (KG): 95.1  Vital Signs: Temp: 98.4 F (36.9 C) (08/27 0808) Temp Source: Oral (08/27 0808) BP: 128/80 (08/27 0808) Pulse Rate: 84 (08/27 0808)  Labs: Recent Labs    12/31/23 2326 01/01/24 1835 01/01/24 2305 01/02/24 0423 01/02/24 0850 01/02/24 1049 01/02/24 2343 01/03/24 0142  HGB 12.5*  --   --  11.4*  --   --   --  11.7*  HCT 35.4*  --   --  32.0*  --   --   --  32.9*  PLT 205  --   --  204  --   --   --  196  HEPARINUNFRC  --   --  0.59 0.56  --   --   --  0.65  CREATININE 2.49*  --   --  1.84*  --   --   --  1.90*  TROPONINIHS  --    < >  --   --    < > 4,671* 2,506* 2,254*   < > = values in this interval not displayed.    Estimated Creatinine Clearance: 44.3 mL/min (A) (by C-G formula based on SCr of 1.9 mg/dL (H)).  Assessment: 61 yom presented to the ED with CP. Troponin now trending up and now starting IV heparin . Baseline CBC is ok and he is not on anticoagulation PTA.   Heparin  level therapeutic x2. Hgb with mild trend down 12.5 > 11.7. With history of hospitalization from GIB will reduce goal rate of heparin  and continue to monitor for signs of bleeding.   Goal of Therapy:  Heparin  level 0.3-0.5 units/ml (history of GIB) Monitor platelets by anticoagulation protocol: Yes   Plan:  Decrease heparin  infusion to 1100 units/hr Check heparin  level in 8 hours and daily while on heparin  Continue to monitor H&H and platelets  Thank you for allowing pharmacy to be a part of this  patient's care.  Shelba Collier, PharmD, BCPS Clinical Pharmacist

## 2024-01-03 NOTE — H&P (View-Only) (Signed)
 Progress Note  Patient Name: Hayden Williams Date of Encounter: 01/03/2024 Primary Cardiologist: Lonni Cash, MD   Subjective   Overnight stable creatinine.   Echo shows new WMA. Patient did not have planned cath 01/02/24, scheduled for today (needed additional IV Fluids per IC)  Vital Signs    Vitals:   01/02/24 2047 01/02/24 2248 01/03/24 0511 01/03/24 0808  BP: (!) 153/98 (!) 133/94 (!) 149/105 128/80  Pulse: 82  87 84  Resp:   19 19  Temp: 98 F (36.7 C)  98.7 F (37.1 C) 98.4 F (36.9 C)  TempSrc: Oral   Oral  SpO2: 98% 100% 97% 100%  Weight:      Height:       No intake or output data in the 24 hours ending 01/03/24 0851  Filed Weights   12/31/23 2316  Weight: 97.5 kg    Physical Exam   GEN: No acute distress.   Neck: No JVD Cardiac: RRR, no murmurs, rubs, or gallops.  Respiratory: Clear to auscultation bilaterally. GI: Soft, nontender, non-distended  MS: No edema  Labs     Chemistry Recent Labs  Lab 12/31/23 2326 01/02/24 0423 01/03/24 0142  NA 141 136 138  K 3.8 4.0 4.1  CL 106 104 109  CO2 23 24 24   GLUCOSE 150* 149* 139*  BUN 46* 28* 25*  CREATININE 2.49* 1.84* 1.90*  CALCIUM  9.0 8.4* 8.6*  PROT  --  6.0*  --   ALBUMIN  --  2.9*  --   AST  --  41  --   ALT  --  20  --   ALKPHOS  --  54  --   BILITOT  --  0.6  --   GFRNONAA 27* 39* 38*  ANIONGAP 13 8 5      Hematology Recent Labs  Lab 12/31/23 2326 01/02/24 0423 01/03/24 0142  WBC 5.6 7.9 6.9  RBC 3.96* 3.57* 3.64*  HGB 12.5* 11.4* 11.7*  HCT 35.4* 32.0* 32.9*  MCV 89.4 89.6 90.4  MCH 31.6 31.9 32.1  MCHC 35.3 35.6 35.6  RDW 13.1 12.8 13.0  PLT 205 204 196     Cardiac Studies   Cardiac Studies & Procedures   ______________________________________________________________________________________________ CARDIAC CATHETERIZATION  CARDIAC CATHETERIZATION 12/02/2014  Conclusion  Post Atrio lesion, 50% stenosed.  1st Mrg lesion, 40% stenosed.  2nd Mrg  lesion, 40% stenosed.  Ramus lesion, 40% stenosed.  Prox LAD to Mid LAD lesion, 50% stenosed.  Dist LAD lesion, 60% stenosed.  Dist RCA lesion, 95% stenosed. There is a 0% residual stenosis post intervention.  A drug-eluting stent was placed.  The left ventricular systolic function is normal.  1. Severe stenosis distal RCA, NSTEMI culprit lesion 2. Successful PTCA/DES x 1 distal RCA 3. Diffuse small vessel disease in the small distal LAD and small obtuse marginal branches. 4. Normal LV systolic function  Recommendations: Will continue ASA and Plavix  for at least one year. Will start statin and beta blocker.  Findings Coronary Findings Diagnostic  Dominance: Right  Left Anterior Descending  Ramus Intermedius  Left Circumflex The vessel is small .  First Obtuse Marginal Branch The vessel is small in size.  Second Obtuse Marginal Branch The vessel is small in size.  Right Coronary Artery discrete .  Right Posterior Atrioventricular Artery discrete located at the bend .  Intervention  Dist RCA lesion PCI The pre-interventional distal flow is normal (TIMI 3). Pre-stent angioplasty was performed. A drug-eluting stent was placed. Minimum lumen  area: 2.5 mm. The strut is apposed. Post-stent angioplasty was performed. Maximum pressure: 15 atm. The post-interventional distal flow is normal (TIMI 3). The intervention was successful. No complications occurred at this lesion. Pressure wire/FFR was not performed on the lesion. There is a 0% residual stenosis post intervention.   STRESS TESTS  MYOCARDIAL PERFUSION IMAGING 06/23/2017  Interpretation Summary  The left ventricular ejection fraction is normal (55-65%).  Nuclear stress EF: 63%.  Blood pressure demonstrated a normal response to exercise.  No T wave inversion was noted during stress.  There was no ST segment deviation noted during stress.  The study is normal.  This is a low risk study.  Normal  perfusion. LVEF 63% with normal wall motion. This is a low risk study.   ECHOCARDIOGRAM  ECHOCARDIOGRAM LIMITED 01/02/2024  Narrative ECHOCARDIOGRAM LIMITED REPORT    Patient Name:   Hayden Williams Date of Exam: 01/02/2024 Medical Rec #:  [id number]      Height:       71.0 in Accession #:    [id number]     Weight:       215.0 lb Date of Birth:  07-Mar-1956      BSA:          2.174 m Patient Age:    68 years       BP:           115/51 mmHg Patient Gender: M              HR:           72 bpm. Exam Location:  Inpatient  Procedure: Limited Echo, Cardiac Doppler and Color Doppler (Both Spectral and Color Flow Doppler were utilized during procedure).  Indications:    Elevated Troponin  History:        Patient has prior history of Echocardiogram examinations, most recent 10/26/2023. Previous Myocardial Infarction, CAD and Angina, Stroke and CKD, stage 3, Signs/Symptoms:Chest Pain; Risk Factors:Hypertension and Diabetes.  Sonographer:    Thea Norlander RCS Referring Phys: [phone] A CALDWELL POWELL JR  IMPRESSIONS   1. Left ventricular ejection fraction, by estimation, is 50%. The left ventricle has mildly decreased function. The left ventricle demonstrates regional wall motion abnormalities with basal inferoseptal severe hypokinesis, basal to mid inferior akinesis, and basal inferolateral severe hypokinesis. Left ventricular diastolic parameters are consistent with Grade I diastolic dysfunction (impaired relaxation). 2. Right ventricular systolic function is mildly reduced. The right ventricular size is mildly enlarged. Tricuspid regurgitation signal is inadequate for assessing PA pressure. 3. The mitral valve is normal in structure. Trivial mitral valve regurgitation. No evidence of mitral stenosis. 4. The aortic valve is tricuspid. Aortic valve regurgitation is not visualized. No aortic stenosis is present. 5. The inferior vena cava is normal in size with greater than 50% respiratory  variability, suggesting right atrial pressure of 3 mmHg.  FINDINGS Left Ventricle: Left ventricular ejection fraction, by estimation, is 50%. The left ventricle has mildly decreased function. The left ventricle demonstrates regional wall motion abnormalities. The left ventricular internal cavity size was normal in size. There is no left ventricular hypertrophy. Left ventricular diastolic parameters are consistent with Grade I diastolic dysfunction (impaired relaxation).  Right Ventricle: The right ventricular size is mildly enlarged. Right ventricular systolic function is mildly reduced. Tricuspid regurgitation signal is inadequate for assessing PA pressure.  Mitral Valve: The mitral valve is normal in structure. Trivial mitral valve regurgitation. No evidence of mitral valve stenosis.  Tricuspid Valve: The tricuspid valve is normal  in structure. Tricuspid valve regurgitation is not demonstrated.  Aortic Valve: The aortic valve is tricuspid. Aortic valve regurgitation is not visualized. No aortic stenosis is present. Aortic valve peak gradient measures 3.9 mmHg.  Pulmonic Valve: The pulmonic valve was normal in structure. Pulmonic valve regurgitation is trivial.  Aorta: The aortic root is normal in size and structure.  Venous: The inferior vena cava is normal in size with greater than 50% respiratory variability, suggesting right atrial pressure of 3 mmHg.  LEFT VENTRICLE PLAX 2D LVIDd:         4.50 cm   Diastology LVIDs:         3.00 cm   LV e' medial:    5.11 cm/s LV PW:         1.00 cm   LV E/e' medial:  8.8 LV IVS:        0.90 cm   LV e' lateral:   6.59 cm/s LVOT diam:     2.30 cm   LV E/e' lateral: 6.8 LV SV:         65 LV SV Index:   30 LVOT Area:     4.15 cm   RIGHT VENTRICLE            IVC RV S prime:     6.34 cm/s  IVC diam: 1.70 cm TAPSE (M-mode): 2.0 cm  LEFT ATRIUM         Index LA diam:    3.90 cm 1.79 cm/m AORTIC VALVE AV Area (Vmax): 3.26 cm AV Vmax:         98.75 cm/s AV Peak Grad:   3.9 mmHg LVOT Vmax:      77.55 cm/s LVOT Vmean:     51.650 cm/s LVOT VTI:       0.156 m  AORTA Ao Root diam: 3.70 cm Ao Asc diam:  3.40 cm  MITRAL VALVE MV Area (PHT): 4.06 cm    SHUNTS MV Decel Time: 187 msec    Systemic VTI:  0.16 m MV E velocity: 45.10 cm/s  Systemic Diam: 2.30 cm MV A velocity: 75.50 cm/s MV E/A ratio:  0.60  Dalton McleanMD Electronically signed by Ezra Kanner Signature Date/Time: 01/02/2024/3:15:35 PM    Final          ______________________________________________________________________________________________        Assessment & Plan    NSTEMI Coronary artery disease, status post RCA stent with recurrent anginal symptoms - with HTN on norvasc  - on ASA, statin and zetia  with LDL < 55 - on metoprolol  and imdur  - with RCA wall motion abnormalities - if no intervention will start DAPT with plavix   Risks and benefits of cardiac catheterization have been discussed with the patient.  These include bleeding, infection, kidney damage, stroke, heart attack, death.  The patient understands these risks and is willing to proceed.  Access recommendations: R rad Procedural considerations: if multi-vessel intervention is needed RCA would be the first    Chronic kidney disease, stage 4 IDDM Anemia, improved, with history of GI bleeding Anemia improved with current hemoglobin at 12 to 11 that may be dilutional from IVF, stable from 01/02/24  - A1c down from 7.7 to 7.3    For questions or updates, please contact [facility] Please consult [email or web address] for contact info under Cardiology/STEMI.      Stanly Leavens, MD FASE [facility] Cardiologist [facility]  [address] [place], [address] [place], [place] [address] [phone]  8:51 AM

## 2024-01-03 NOTE — Progress Notes (Signed)
 [facility]   ROUNDING   NOTE LAJARVIS ITALIANO FMW:[id number]  DOB: 1956-04-11  DOA: 12/31/2023  PCP: Lazoff, Shawn P, DO  01/03/2024,7:23 AM  LOS: 1 day    Code Status: Full code     from: Home current Dispo: Unclear    20 black male HTN HLD DM TY 2 who sees endocrinology CAD + stent to RCA 2016 CKD 3B follows with [facility]- DM TY 2 A1c 7.7 recently Elevated PSA sees urology-supposed to have prostatic biopsy 11/21/2023 Esophageal ulcer with GI bleed secondary to aspirin  Plavix  in 2020 Right eye blindness chronic lateral deviation Previous ischemic left paramedian medullary infarct 10/26/2023 [facility] OSA not using BiPAP  8/24 while at movies developed substernal chest pain nonresponsive to nitroglycerin  and went to [facility] Troponin 119--108--157, EKG NSR old RBBB BUN/creatinine 46/2.4 Started on heparin  in ED 8/26 echocardiogram LVEF 50% decreased function left ventricular regional wall motion inferior septal severe hypokinesis basal to mid inferior akinesis basal inferolateral hypokinesis in addition to grade 1 diastolic dysfunction 8/27 cardiac cath performed DES placed Synergy XD 2.75 X16 2 right coronary artery   Plan  NSTEMI with peak troponin 5090 on 8/26 Combined systolic diastolic heart failure not acute As above-medical management as per cardiology includes amlodipine  10 atorvastatin  80 Zetia  10 Imdur  30 metoprolol  100 twice daily Disposition as per them  Recent stroke DAPT at least for a year?--may need to involve neurology in this conversation as well  DMT by 2 Cbg 136--252 Continues Lantus  25 moderate sliding scale--increase back to home dose of 44 units gradually and add back Mounjaro Resume Trulicity  4.5 q. Monday  CKD 4 Thiazide combo disc Follow trends IV fluids stopped by cardiology  Previous GI bleed Right eye blindness [facility] Mild obesity class I      Data Reviewed:   Sodium 138 potassium 4.1 BUN/creatinine 25/1.9 Troponin trend peak  5190-->2254 WBC 6.9 hemoglobin 11.7 platelet 196  DVT prophylaxis: On heparin  GTT  Status is: Inpatient Remains inpatient appropriate because:   Workup still needed     Subjective: Seen in holding area post cath looks well had a radial band done Comfortable no chest pain no abdominal pain no fever    Objective + exam Vitals:   01/02/24 1747 01/02/24 2047 01/02/24 2248 01/03/24 0511  BP: (!) 141/93 (!) 153/98 (!) 133/94 (!) 149/105  Pulse: 82 82  87  Resp:    19  Temp:  98 F (36.7 C)  98.7 F (37.1 C)  TempSrc:  Oral    SpO2: 96% 98% 100% 97%  Weight:      Height:       Filed Weights   12/31/23 2316  Weight: 97.5 kg     Examination: EOMI NCAT no focal deficit S1-S2 no murmur Abdomen soft no rebound no guarding ROM intact Power 5/5 No lower extremity edema     Scheduled Meds:  allopurinol   100 mg Oral BID   alum & mag hydroxide-simeth  30 mL Oral Once   amLODipine   10 mg Oral QPM   aspirin  EC  81 mg Oral Daily   atorvastatin   80 mg Oral Daily   ezetimibe   10 mg Oral Daily   insulin  aspart  0-15 Units Subcutaneous TID WC   insulin  aspart  0-5 Units Subcutaneous QHS   insulin  glargine  25 Units Subcutaneous Daily   isosorbide  mononitrate  30 mg Oral Daily   metoprolol  tartrate  100 mg Oral BID   pantoprazole   40 mg Oral  Daily   tamsulosin   0.4 mg Oral Daily   Continuous Infusions:  sodium chloride  75 mL/hr at 01/02/24 1521   heparin  1,150 Units/hr (01/03/24 0522)    Time 50  Jai-Gurmukh Lanessa Shill, MD  [facility]

## 2024-01-03 NOTE — Progress Notes (Signed)
 Progress Note  Patient Name: Hayden Williams Date of Encounter: 01/03/2024 Primary Cardiologist: Lonni Cash, MD   Subjective   Overnight stable creatinine.   Echo shows new WMA. Patient did not have planned cath 01/02/24, scheduled for today (needed additional IV Fluids per IC)  Vital Signs    Vitals:   01/02/24 2047 01/02/24 2248 01/03/24 0511 01/03/24 0808  BP: (!) 153/98 (!) 133/94 (!) 149/105 128/80  Pulse: 82  87 84  Resp:   19 19  Temp: 98 F (36.7 C)  98.7 F (37.1 C) 98.4 F (36.9 C)  TempSrc: Oral   Oral  SpO2: 98% 100% 97% 100%  Weight:      Height:       No intake or output data in the 24 hours ending 01/03/24 0851  Filed Weights   12/31/23 2316  Weight: 97.5 kg    Physical Exam   GEN: No acute distress.   Neck: No JVD Cardiac: RRR, no murmurs, rubs, or gallops.  Respiratory: Clear to auscultation bilaterally. GI: Soft, nontender, non-distended  MS: No edema  Labs     Chemistry Recent Labs  Lab 12/31/23 2326 01/02/24 0423 01/03/24 0142  NA 141 136 138  K 3.8 4.0 4.1  CL 106 104 109  CO2 23 24 24   GLUCOSE 150* 149* 139*  BUN 46* 28* 25*  CREATININE 2.49* 1.84* 1.90*  CALCIUM  9.0 8.4* 8.6*  PROT  --  6.0*  --   ALBUMIN  --  2.9*  --   AST  --  41  --   ALT  --  20  --   ALKPHOS  --  54  --   BILITOT  --  0.6  --   GFRNONAA 27* 39* 38*  ANIONGAP 13 8 5      Hematology Recent Labs  Lab 12/31/23 2326 01/02/24 0423 01/03/24 0142  WBC 5.6 7.9 6.9  RBC 3.96* 3.57* 3.64*  HGB 12.5* 11.4* 11.7*  HCT 35.4* 32.0* 32.9*  MCV 89.4 89.6 90.4  MCH 31.6 31.9 32.1  MCHC 35.3 35.6 35.6  RDW 13.1 12.8 13.0  PLT 205 204 196     Cardiac Studies   Cardiac Studies & Procedures   ______________________________________________________________________________________________ CARDIAC CATHETERIZATION  CARDIAC CATHETERIZATION 12/02/2014  Conclusion  Post Atrio lesion, 50% stenosed.  1st Mrg lesion, 40% stenosed.  2nd Mrg  lesion, 40% stenosed.  Ramus lesion, 40% stenosed.  Prox LAD to Mid LAD lesion, 50% stenosed.  Dist LAD lesion, 60% stenosed.  Dist RCA lesion, 95% stenosed. There is a 0% residual stenosis post intervention.  A drug-eluting stent was placed.  The left ventricular systolic function is normal.  1. Severe stenosis distal RCA, NSTEMI culprit lesion 2. Successful PTCA/DES x 1 distal RCA 3. Diffuse small vessel disease in the small distal LAD and small obtuse marginal branches. 4. Normal LV systolic function  Recommendations: Will continue ASA and Plavix  for at least one year. Will start statin and beta blocker.  Findings Coronary Findings Diagnostic  Dominance: Right  Left Anterior Descending  Ramus Intermedius  Left Circumflex The vessel is small .  First Obtuse Marginal Branch The vessel is small in size.  Second Obtuse Marginal Branch The vessel is small in size.  Right Coronary Artery discrete .  Right Posterior Atrioventricular Artery discrete located at the bend .  Intervention  Dist RCA lesion PCI The pre-interventional distal flow is normal (TIMI 3). Pre-stent angioplasty was performed. A drug-eluting stent was placed. Minimum lumen  area: 2.5 mm. The strut is apposed. Post-stent angioplasty was performed. Maximum pressure: 15 atm. The post-interventional distal flow is normal (TIMI 3). The intervention was successful. No complications occurred at this lesion. Pressure wire/FFR was not performed on the lesion. There is a 0% residual stenosis post intervention.   STRESS TESTS  MYOCARDIAL PERFUSION IMAGING 06/23/2017  Interpretation Summary  The left ventricular ejection fraction is normal (55-65%).  Nuclear stress EF: 63%.  Blood pressure demonstrated a normal response to exercise.  No T wave inversion was noted during stress.  There was no ST segment deviation noted during stress.  The study is normal.  This is a low risk study.  Normal  perfusion. LVEF 63% with normal wall motion. This is a low risk study.   ECHOCARDIOGRAM  ECHOCARDIOGRAM LIMITED 01/02/2024  Narrative ECHOCARDIOGRAM LIMITED REPORT    Patient Name:   Hayden Williams Date of Exam: 01/02/2024 Medical Rec #:  [id number]      Height:       71.0 in Accession #:    [id number]     Weight:       215.0 lb Date of Birth:  07-Mar-1956      BSA:          2.174 m Patient Age:    68 years       BP:           115/51 mmHg Patient Gender: M              HR:           72 bpm. Exam Location:  Inpatient  Procedure: Limited Echo, Cardiac Doppler and Color Doppler (Both Spectral and Color Flow Doppler were utilized during procedure).  Indications:    Elevated Troponin  History:        Patient has prior history of Echocardiogram examinations, most recent 10/26/2023. Previous Myocardial Infarction, CAD and Angina, Stroke and CKD, stage 3, Signs/Symptoms:Chest Pain; Risk Factors:Hypertension and Diabetes.  Sonographer:    Thea Norlander RCS Referring Phys: [phone] A CALDWELL POWELL JR  IMPRESSIONS   1. Left ventricular ejection fraction, by estimation, is 50%. The left ventricle has mildly decreased function. The left ventricle demonstrates regional wall motion abnormalities with basal inferoseptal severe hypokinesis, basal to mid inferior akinesis, and basal inferolateral severe hypokinesis. Left ventricular diastolic parameters are consistent with Grade I diastolic dysfunction (impaired relaxation). 2. Right ventricular systolic function is mildly reduced. The right ventricular size is mildly enlarged. Tricuspid regurgitation signal is inadequate for assessing PA pressure. 3. The mitral valve is normal in structure. Trivial mitral valve regurgitation. No evidence of mitral stenosis. 4. The aortic valve is tricuspid. Aortic valve regurgitation is not visualized. No aortic stenosis is present. 5. The inferior vena cava is normal in size with greater than 50% respiratory  variability, suggesting right atrial pressure of 3 mmHg.  FINDINGS Left Ventricle: Left ventricular ejection fraction, by estimation, is 50%. The left ventricle has mildly decreased function. The left ventricle demonstrates regional wall motion abnormalities. The left ventricular internal cavity size was normal in size. There is no left ventricular hypertrophy. Left ventricular diastolic parameters are consistent with Grade I diastolic dysfunction (impaired relaxation).  Right Ventricle: The right ventricular size is mildly enlarged. Right ventricular systolic function is mildly reduced. Tricuspid regurgitation signal is inadequate for assessing PA pressure.  Mitral Valve: The mitral valve is normal in structure. Trivial mitral valve regurgitation. No evidence of mitral valve stenosis.  Tricuspid Valve: The tricuspid valve is normal  in structure. Tricuspid valve regurgitation is not demonstrated.  Aortic Valve: The aortic valve is tricuspid. Aortic valve regurgitation is not visualized. No aortic stenosis is present. Aortic valve peak gradient measures 3.9 mmHg.  Pulmonic Valve: The pulmonic valve was normal in structure. Pulmonic valve regurgitation is trivial.  Aorta: The aortic root is normal in size and structure.  Venous: The inferior vena cava is normal in size with greater than 50% respiratory variability, suggesting right atrial pressure of 3 mmHg.  LEFT VENTRICLE PLAX 2D LVIDd:         4.50 cm   Diastology LVIDs:         3.00 cm   LV e' medial:    5.11 cm/s LV PW:         1.00 cm   LV E/e' medial:  8.8 LV IVS:        0.90 cm   LV e' lateral:   6.59 cm/s LVOT diam:     2.30 cm   LV E/e' lateral: 6.8 LV SV:         65 LV SV Index:   30 LVOT Area:     4.15 cm   RIGHT VENTRICLE            IVC RV S prime:     6.34 cm/s  IVC diam: 1.70 cm TAPSE (M-mode): 2.0 cm  LEFT ATRIUM         Index LA diam:    3.90 cm 1.79 cm/m AORTIC VALVE AV Area (Vmax): 3.26 cm AV Vmax:         98.75 cm/s AV Peak Grad:   3.9 mmHg LVOT Vmax:      77.55 cm/s LVOT Vmean:     51.650 cm/s LVOT VTI:       0.156 m  AORTA Ao Root diam: 3.70 cm Ao Asc diam:  3.40 cm  MITRAL VALVE MV Area (PHT): 4.06 cm    SHUNTS MV Decel Time: 187 msec    Systemic VTI:  0.16 m MV E velocity: 45.10 cm/s  Systemic Diam: 2.30 cm MV A velocity: 75.50 cm/s MV E/A ratio:  0.60  Dalton McleanMD Electronically signed by Ezra Kanner Signature Date/Time: 01/02/2024/3:15:35 PM    Final          ______________________________________________________________________________________________        Assessment & Plan    NSTEMI Coronary artery disease, status post RCA stent with recurrent anginal symptoms - with HTN on norvasc  - on ASA, statin and zetia  with LDL < 55 - on metoprolol  and imdur  - with RCA wall motion abnormalities - if no intervention will start DAPT with plavix   Risks and benefits of cardiac catheterization have been discussed with the patient.  These include bleeding, infection, kidney damage, stroke, heart attack, death.  The patient understands these risks and is willing to proceed.  Access recommendations: R rad Procedural considerations: if multi-vessel intervention is needed RCA would be the first    Chronic kidney disease, stage 4 IDDM Anemia, improved, with history of GI bleeding Anemia improved with current hemoglobin at 12 to 11 that may be dilutional from IVF, stable from 01/02/24  - A1c down from 7.7 to 7.3    For questions or updates, please contact [facility] Please consult [email or web address] for contact info under Cardiology/STEMI.      Stanly Leavens, MD FASE [facility] Cardiologist [facility]  [address] [place], [address] [place], [place] [address] [phone]  8:51 AM

## 2024-01-03 NOTE — Plan of Care (Signed)
 Patient calm and cooperative A&Ox4, family at bedside. Patient complaint of chest pain addressed, hospitalist notified. PRN medications given, EKG taken, and labwork taken. Heprin infusion maintained. Patient left with call bell in reach, bed in lowest position and siderails up.   Problem: Education: Goal: Knowledge of General Education information will improve Description: Including pain rating scale, medication(s)/side effects and non-pharmacologic comfort measures Outcome: Progressing   Problem: Health Behavior/Discharge Planning: Goal: Ability to manage health-related needs will improve Outcome: Progressing   Problem: Clinical Measurements: Goal: Ability to maintain clinical measurements within normal limits will improve Outcome: Progressing   Problem: Activity: Goal: Risk for activity intolerance will decrease Outcome: Progressing   Problem: Nutrition: Goal: Adequate nutrition will be maintained Outcome: Progressing   Problem: Coping: Goal: Level of anxiety will decrease Outcome: Progressing   Problem: Pain Managment: Goal: General experience of comfort will improve and/or be controlled Outcome: Progressing   Problem: Safety: Goal: Ability to remain free from injury will improve Outcome: Progressing   Problem: Skin Integrity: Goal: Risk for impaired skin integrity will decrease Outcome: Progressing   Problem: Coping: Goal: Ability to adjust to condition or change in health will improve Outcome: Progressing   Problem: Health Behavior/Discharge Planning: Goal: Ability to identify and utilize available resources and services will improve Outcome: Progressing   Problem: Education: Goal: Understanding of CV disease, CV risk reduction, and recovery process will improve Outcome: Progressing

## 2024-01-03 NOTE — Progress Notes (Signed)
 Patient's Dexcom reads 134

## 2024-01-03 NOTE — Interval H&P Note (Signed)
 History and Physical Interval Note:  01/03/2024 10:47 AM  Hayden Williams  has presented today for surgery, with the diagnosis of nstemi.  The various methods of treatment have been discussed with the patient and family. After consideration of risks, benefits and other options for treatment, the patient has consented to  Procedure(s): LEFT HEART CATH AND CORONARY ANGIOGRAPHY (N/A) as a surgical intervention.  The patient's history has been reviewed, patient examined, no change in status, stable for surgery.  I have reviewed the patient's chart and labs.  Questions were answered to the patient's satisfaction.     Aziya Arena

## 2024-01-04 ENCOUNTER — Telehealth: Payer: Self-pay | Admitting: Physician Assistant

## 2024-01-04 ENCOUNTER — Other Ambulatory Visit (HOSPITAL_COMMUNITY): Payer: Self-pay

## 2024-01-04 ENCOUNTER — Encounter (HOSPITAL_COMMUNITY): Payer: Self-pay | Admitting: Cardiovascular Disease

## 2024-01-04 DIAGNOSIS — R079 Chest pain, unspecified: Secondary | ICD-10-CM | POA: Diagnosis not present

## 2024-01-04 LAB — CBC
HCT: 29.5 % — ABNORMAL LOW (ref 39.0–52.0)
Hemoglobin: 10.6 g/dL — ABNORMAL LOW (ref 13.0–17.0)
MCH: 32.2 pg (ref 26.0–34.0)
MCHC: 35.9 g/dL (ref 30.0–36.0)
MCV: 89.7 fL (ref 80.0–100.0)
Platelets: 177 K/uL (ref 150–400)
RBC: 3.29 MIL/uL — ABNORMAL LOW (ref 4.22–5.81)
RDW: 13.1 % (ref 11.5–15.5)
WBC: 6.2 K/uL (ref 4.0–10.5)
nRBC: 0 % (ref 0.0–0.2)

## 2024-01-04 LAB — BASIC METABOLIC PANEL WITH GFR
Anion gap: 8 (ref 5–15)
BUN: 24 mg/dL — ABNORMAL HIGH (ref 8–23)
CO2: 22 mmol/L (ref 22–32)
Calcium: 8.5 mg/dL — ABNORMAL LOW (ref 8.9–10.3)
Chloride: 108 mmol/L (ref 98–111)
Creatinine, Ser: 1.95 mg/dL — ABNORMAL HIGH (ref 0.61–1.24)
GFR, Estimated: 37 mL/min — ABNORMAL LOW (ref >60)
Glucose, Bld: 119 mg/dL — ABNORMAL HIGH (ref 70–99)
Potassium: 3.9 mmol/L (ref 3.5–5.1)
Sodium: 138 mmol/L (ref 135–145)

## 2024-01-04 LAB — GLUCOSE, CAPILLARY: Glucose-Capillary: 103 mg/dL — ABNORMAL HIGH (ref 70–99)

## 2024-01-04 MED ORDER — NITROGLYCERIN 0.4 MG SL SUBL
0.4000 mg | SUBLINGUAL_TABLET | SUBLINGUAL | 4 refills | Status: AC | PRN
  Filled 2024-01-04: qty 25, 8d supply, fill #0

## 2024-01-04 MED ORDER — TICAGRELOR 90 MG PO TABS
90.0000 mg | ORAL_TABLET | Freq: Two times a day (BID) | ORAL | 12 refills | Status: DC
  Filled 2024-01-04: qty 60, 30d supply, fill #0

## 2024-01-04 MED ORDER — NITROGLYCERIN 0.4 MG SL SUBL: 0.4000 mg | SUBLINGUAL_TABLET | SUBLINGUAL | 4 refills | Status: DC | PRN

## 2024-01-04 MED ORDER — TICAGRELOR 90 MG PO TABS: 90.0000 mg | ORAL_TABLET | Freq: Two times a day (BID) | ORAL | 12 refills | Status: DC

## 2024-01-04 MED ORDER — ISOSORBIDE MONONITRATE ER 30 MG PO TB24
30.0000 mg | ORAL_TABLET | Freq: Every day | ORAL | 2 refills | Status: DC
  Filled 2024-01-04: qty 30, 30d supply, fill #0

## 2024-01-04 MED ORDER — ISOSORBIDE MONONITRATE ER 30 MG PO TB24: 30.0000 mg | ORAL_TABLET | Freq: Every day | ORAL | 2 refills | Status: DC

## 2024-01-04 NOTE — Progress Notes (Signed)
 CARDIAC REHAB PHASE I      Post MI/stent education including restrictions, risk factors, exercise guidelines, antiplatelet therapy importance, MI booklet, NTG use, heart healthy diabetic diet and CRP2 reviewed. All questions and concerns addressed. Will refer to [facility] for CRP2. Plan for discharge home.   [phone] Vaughn Asberry Hacking, RN BSN 01/04/2024 10:45 AM

## 2024-01-04 NOTE — TOC CM/SW Note (Signed)
 Transition of Care [facility]) - Inpatient Brief Assessment   Patient Details  Name: Hayden Williams MRN: [id number] Date of Birth: Oct 15, 1955  [facility] CM/SW Contact:    Sudie Erminio Deems, RN Phone Number: 01/04/2024, 9:35 AM   Clinical Narrative: Patient presented for chest pain-post LHC. Plan for home on Brilinta . Patient discussed Brilinta  co pay with the patient and he states he does not have a co pay for the drug. Rx's sent to [facility] on [address]. No further needs identified.    Transition of Care Asessment: Insurance and Status: Insurance coverage has been reviewed Patient has primary care physician: Yes Home environment has been reviewed: reviewed Prior level of function:: independent Prior/Current Home Services: No current home services Social Drivers of Health Review: SDOH reviewed no interventions necessary Readmission risk has been reviewed: Yes Transition of care needs: no transition of care needs at this time

## 2024-01-04 NOTE — Discharge Summary (Signed)
 Physician Discharge Summary  Hayden Williams FMW:[id number] DOB: 06/18/1955 DOA: 12/31/2023  PCP: Lazoff, Shawn P, DO  Admit date: 12/31/2023 Discharge date: 01/04/2024  Time spent: 50 minutes  Recommendations for Outpatient Follow-up:  Needs outpatient cardiology close follow-up within 3 to 4 weeks-Brilinta  + aspirin  X 1 year at least and then may choose to discontinue Brilinta  Needs Chem-7 CBC 1 week Should follow-up with outpatient nephrologist who normally sees him Needs A1c lipid panel 2 months and follow-up with whoever prescribes him his [facility] Outpatient titration of CPAP per PCP  Discharge Diagnoses:  MAIN problem for hospitalization   Acute coronary synDrome status post stent  Please see below for itemized issues addressed in HOpsital- refer to other progress notes for clarity if needed  Discharge Condition: Improved  Diet recommendation: Diabetic heart healthy  Filed Weights   12/31/23 2316  Weight: 97.5 kg    History of present illness:  47 black male HTN HLD DM TY 2 who sees endocrinology CAD + stent to RCA 2016 CKD 3B follows with [facility] nephrology- DM TY 2 A1c 7.7 recently Elevated PSA sees urology-supposed to have prostatic biopsy 11/21/2023 Esophageal ulcer with GI bleed secondary to aspirin  Plavix  in 2020 Right eye blindness chronic lateral deviation Previous ischemic left paramedian medullary infarct 10/26/2023 [facility] OSA not using BiPAP   8/24 while at movies developed substernal chest pain nonresponsive to nitroglycerin  and went to [facility] Troponin 119--108--157, EKG NSR old RBBB BUN/creatinine 46/2.4 Started on heparin  in ED 8/26 echocardiogram LVEF 50% decreased function left ventricular regional wall motion inferior septal severe hypokinesis basal to mid inferior akinesis basal inferolateral hypokinesis in addition to grade 1 diastolic dysfunction 8/27 cardiac cath performed DES placed Synergy XD 2.75 X16 2 right coronary artery      Plan   NSTEMI with peak troponin 5090 on 8/26 Combined systolic diastolic heart failure not acute Cardiac cath as above performed 8/27 with stent placement medical management as per cardiology includes amlodipine  10 atorvastatin  80 Zetia  10 Imdur  30 metoprolol  100 twice daily  Recent stroke DAPT at least for a year with both Brilinta  and aspirin -case manager to check in regarding affordability of the same   DM type II A1c 7.3 Cbg well-controlled overall below 200 Long-acting insulin  back to  44 units at discharge--did transition off of Trulicity  to Mounjaro in the outpatient setting and will continue this and follow-up with his primary  CKD 4 Thiazide combo disc--- outpatient rechallenge once labs have resulted and he follows with his nephrologist Transient IV fluids given and stopped   Previous GI bleed Right eye blindness [facility] Mild obesity class I  Procedures: 8/26 echocardiogram LVEF 50% decreased function left ventricular regional wall motion inferior septal severe hypokinesis basal to mid inferior akinesis basal inferolateral hypokinesis in addition to grade 1 diastolic dysfunction 8/27 cardiac cath performed DES placed Synergy XD 2.75 X16 2 right coronary artery  Cardiac cath LEFT HEART CATH AND CORONARY ANGIOGRAPHY  CORONARY STENT INTERVENTION   Conclusion      RPAV lesion is 50% stenosed.   Prox LAD to Mid LAD lesion is 50% stenosed.   Dist LAD lesion is 60% stenosed.   Ramus lesion is 40% stenosed.   1st Mrg lesion is 40% stenosed.   2nd Mrg lesion is 40% stenosed.   Prox RCA lesion is 99% stenosed.   Ost LM lesion is 20% stenosed.   Mid RCA to Dist RCA lesion is 55% stenosed.   A drug-eluting stent was successfully placed using  a STENT SYNERGY XD [id number].   Post intervention, there is a 0% residual stenosis.   In the absence of any other complications or medical issues, we expect the patient to be ready for discharge from an interventional cardiology  perspective on 01/04/2024.   Recommend uninterrupted dual antiplatelet therapy with Aspirin  81mg  daily and Ticagrelor  90mg  twice daily for a minimum of 12 months (ACS-Class I recommendation). (i.e. Studies not automatically included, echos, thoracentesis, etc; not x-rays)  Consultations: Cardiology  Discharge Exam: Vitals:   01/04/24 0803 01/04/24 0840  BP: 106/78 106/78  Pulse: 77 78  Resp: 17   Temp: 98.8 F (37.1 C)   SpO2: 95%     Subj on day of d/c   Awake coherent no distress no pain feels well up to bathroom earlier today  General Exam on discharge  EOMI NCAT no focal deficit no icterus or pallor no wheeze no rales no rhonchi S1-S2 no murmur-predominant sinus on monitors Right arm seems comfortable band has been taken off Abdomen soft no rebound No lower extremity edema  Discharge Instructions   Discharge Instructions     (HEART FAILURE PATIENTS) Call MD:  Anytime you have any of the following symptoms: 1) 3 pound weight gain in 24 hours or 5 pounds in 1 week 2) shortness of breath, with or without a dry hacking cough 3) swelling in the hands, feet or stomach 4) if you have to sleep on extra pillows at night in order to breathe.   Complete by: As directed    AMB Referral to Cardiac Rehabilitation - Phase II   Complete by: As directed    Diagnosis:  NSTEMI Coronary Stents     After initial evaluation and assessments completed: Virtual Based Care may be provided alone or in conjunction with Phase 2 Cardiac Rehab based on patient barriers.: Yes   Intensive Cardiac Rehabilitation (ICR) [facility] location only OR Traditional Cardiac Rehabilitation (TCR) *If criteria for ICR are not met will enroll in TCR ([facility] only): Yes   AMB Referral to Phase II Cardiac Rehab   Complete by: As directed    Diagnosis: Coronary Stents   After initial evaluation and assessments completed: Virtual Based Care may be provided alone or in conjunction with Phase 2 Cardiac Rehab based on patient  barriers.: Yes   Intensive Cardiac Rehabilitation (ICR) [facility] location only OR Traditional Cardiac Rehabilitation (TCR) *If criteria for ICR are not met will enroll in TCR [facility] only): Yes   Call MD for:  extreme fatigue   Complete by: As directed    Consult to Transition of Care Team   Complete by: As directed    brillinta   Diet - low sodium heart healthy   Complete by: As directed    Discharge instructions   Complete by: As directed    You have been prescribed Imdur  which will help with dilating the arteries of your heart and your blood pressure-I would not resume your Diovan  at this time and you can talk to your primary physician or cardiologist within 3 to 4 weeks about starting it back at a lower dose You will be on a new medication called Brilinta  which you take twice a day in addition to aspirin  and duration and timing of discontinuation of Brilinta  should be determined by the cardiologist-usually it is anywhere from 3 months to 12 months I would recommend getting your cholesterol checked in the near future continue good diabetic control and follow-up with outpatient cardiac rehab   Increase activity  slowly   Complete by: As directed       Allergies as of 01/04/2024       Reactions   Peanut (diagnostic) Anaphylaxis, Swelling   Peanut Oil Anaphylaxis, Swelling   Niaspan [niacin] Other (See Comments)   Unknown reaction    Zestril [lisinopril] Other (See Comments), Cough   Increased BUN and creatinine        Medication List     STOP taking these medications    Trulicity  4.5 MG/0.5ML Soaj Generic drug: Dulaglutide    valsartan -hydrochlorothiazide  160-25 MG tablet Commonly known as: DIOVAN -HCT       TAKE these medications    allopurinol  100 MG tablet Commonly known as: ZYLOPRIM  Take 100 mg by mouth 2 (two) times daily.   amLODipine  10 MG tablet Commonly known as: NORVASC  Take 10 mg by mouth every evening.   aspirin  EC 81 MG tablet Take 1 tablet (81 mg total) by  mouth daily. To start in 4 weeks once Plavix  and Brillinta course completed   atorvastatin  80 MG tablet Commonly known as: LIPITOR  TAKE 1 TABLET BY MOUTH DAILY   ezetimibe  10 MG tablet Commonly known as: Zetia  Take 1 tablet (10 mg total) by mouth daily.   insulin  glargine (1 Unit Dial ) 300 UNIT/ML Solostar Pen Commonly known as: TOUJEO  Inject 44 Units into the skin daily.   IRON-VITAMIN C  PO Take 1 tablet by mouth daily.   isosorbide  mononitrate 30 MG 24 hr tablet Commonly known as: IMDUR  Take 1 tablet (30 mg total) by mouth daily.   metoprolol  tartrate 100 MG tablet Commonly known as: LOPRESSOR  TAKE 1 TABLET BY MOUTH 2 TIMES A DAY   Mounjaro 10 MG/0.5ML Pen Generic drug: tirzepatide Inject 10 mg into the skin once a week.   nitroGLYCERIN  0.4 MG SL tablet Commonly known as: NITROSTAT  Place 1 tablet (0.4 mg total) under the tongue every 5 (five) minutes as needed for chest pain.   NovoLOG  FlexPen 100 UNIT/ML FlexPen Generic drug: insulin  aspart Inject 3-10 Units into the skin See admin instructions. Inject 3-10 units three times daily before each meal, per sliding scale: Under 100: 3 units 100 - 150 : 5 units 151 - 200 : 7 units 201 - 250 : 8 units 251 - 300 : 9 units Over 300 : 10 units   pantoprazole  40 MG tablet Commonly known as: PROTONIX  Take 1 tablet (40 mg total) by mouth daily.   tamsulosin  0.4 MG Caps capsule Commonly known as: FLOMAX  Take 0.4 mg by mouth daily.   ticagrelor  90 MG Tabs tablet Commonly known as: BRILINTA  Take 1 tablet (90 mg total) by mouth 2 (two) times daily.   VITAMIN D-3 PO Take 1 capsule by mouth daily.       Allergies  Allergen Reactions   Peanut (Diagnostic) Anaphylaxis and Swelling   Peanut Oil Anaphylaxis and Swelling   Niaspan [Niacin] Other (See Comments)    Unknown reaction    Zestril [Lisinopril] Other (See Comments) and Cough    Increased BUN and creatinine        The results of significant diagnostics  from this hospitalization (including imaging, microbiology, ancillary and laboratory) are listed below for reference.    Significant Diagnostic Studies:   Microbiology: No results found for this or any previous visit (from the past 240 hours).   Labs: Basic Metabolic Panel: Recent Labs  Lab 12/31/23 2326 01/02/24 0423 01/03/24 0142 01/04/24 0451  NA 141 136 138 138  K 3.8 4.0 4.1 3.9  CL  106 104 109 108  CO2 23 24 24 22   GLUCOSE 150* 149* 139* 119*  BUN 46* 28* 25* 24*  CREATININE 2.49* 1.84* 1.90* 1.95*  CALCIUM  9.0 8.4* 8.6* 8.5*  MG  --   --  1.7  --   PHOS  --   --  2.0*  --    Liver Function Tests: Recent Labs  Lab 01/02/24 0423  AST 41  ALT 20  ALKPHOS 54  BILITOT 0.6  PROT 6.0*  ALBUMIN 2.9*   No results for input(s): LIPASE, AMYLASE in the last 168 hours. No results for input(s): AMMONIA in the last 168 hours. CBC: Recent Labs  Lab 12/31/23 2326 01/02/24 0423 01/03/24 0142 01/04/24 0451  WBC 5.6 7.9 6.9 6.2  NEUTROABS 3.1  --   --   --   HGB 12.5* 11.4* 11.7* 10.6*  HCT 35.4* 32.0* 32.9* 29.5*  MCV 89.4 89.6 90.4 89.7  PLT 205 204 196 177   Cardiac Enzymes: No results for input(s): CKTOTAL, CKMB, CKMBINDEX, TROPONINI in the last 168 hours. BNP: BNP (last 3 results) No results for input(s): BNP in the last 8760 hours.  ProBNP (last 3 results) No results for input(s): PROBNP in the last 8760 hours.  CBG: Recent Labs  Lab 01/02/24 2051 01/02/24 2142 01/03/24 0822 01/03/24 2107 01/04/24 0806  GLUCAP 200* 252* 135* 241* 103*    Signed:  Colen Grimes MD   [facility] 01/04/2024, 9:21 AM

## 2024-01-04 NOTE — Discharge Instructions (Addendum)
 Post-NSTEMI instructions: No driving for 1 week. No lifting over 10 lbs for 2 weeks. No sexual activity for 2 weeks. Keep procedure site clean & dry. If you notice increased pain, swelling, bleeding or pus, call/return!  You may shower, but no soaking baths/hot tubs/pools for 1 week.   Information about your medication: Brilinta  (anti-platelet agent)  Generic Name (Brand): ticagrelor  (Brilinta ), twice daily medication  PURPOSE: You are taking this medication along with aspirin  to lower your chance of having a heart attack, stroke, or blood clots in your heart stent. These can be fatal. Brilinta  and aspirin  help prevent platelets from sticking together and forming a clot that can block an artery or your stent.   Common SIDE EFFECTS you may experience include: bruising or bleeding more easily, shortness of breath  Do not stop taking BRILINTA  without talking to the doctor who prescribes it for you. People who are treated with a stent and stop taking Brilinta  too soon, have a higher risk of getting a blood clot in the stent, having a heart attack, or dying. If you stop Brilinta  because of bleeding, or for other reasons, your risk of a heart attack or stroke may increase.   Avoid taking NSAID agents or anti-inflammatory medications such as ibuprofen, naproxen given increased bleed risk with Brilinta  - can use acetaminophen  (Tylenol ) if needed for pain.  Tell all of your doctors and dentists that you are taking Brilinta . They should talk to the doctor who prescribed Brilinta  for you before you have any surgery or invasive procedure.   Contact your health care provider if you experience: severe or uncontrollable bleeding, pink/red/brown urine, vomiting blood or vomit that looks like coffee grounds, red or black stools (looks like tar), coughing up blood or blood clots ----------------------------------------------------------------------------------------------------------------------

## 2024-01-04 NOTE — Plan of Care (Signed)

## 2024-01-04 NOTE — Telephone Encounter (Signed)
   Attention TOC pool,  This patient will need a TOC phone call after discharge. They are being discharged likely later today. Follow-up appointment has already been arranged with: Orren Fabry 9/8 They are a patient of Lonni Cash, MD.  Thank you! Derris Millan N Daishaun Ayre, PA-C

## 2024-01-04 NOTE — Telephone Encounter (Addendum)
 Dr. Santo relayed request to send in refill of Zetia  (other meds sent in by IM to [facility] pharmacy). I did not view this message until patient left the hospital. Patient has already been discharged so I called him to ask where he wants our team to send in the Zetia  refills to. Got VM. Lmtcb. When he calls back, please inquire and send in refills. Dr. Santo also noted that patient wants to switch to him for primary cardiologist. I will send message per protocol to Dr. Verlin to get the OK. Has APP f/u 9/8.

## 2024-01-04 NOTE — Progress Notes (Addendum)
 Progress Note  Patient Name: Hayden Williams Date of Encounter: 01/04/2024  Primary Cardiologist: Lonni Cash, MD  Subjective   Feeling great, no CP or SOB.  Inpatient Medications    Scheduled Meds:  allopurinol   100 mg Oral BID   alum & mag hydroxide-simeth  30 mL Oral Once   amLODipine   10 mg Oral QPM   aspirin  EC  81 mg Oral Daily   atorvastatin   80 mg Oral Daily   ezetimibe   10 mg Oral Daily   free water   500 mL Oral Once   insulin  aspart  0-15 Units Subcutaneous TID WC   insulin  aspart  0-5 Units Subcutaneous QHS   insulin  glargine  25 Units Subcutaneous Daily   isosorbide  mononitrate  30 mg Oral Daily   metoprolol  tartrate  100 mg Oral BID   pantoprazole   40 mg Oral Daily   sodium chloride  flush  3 mL Intravenous Q12H   tamsulosin   0.4 mg Oral Daily   ticagrelor   90 mg Oral BID   Continuous Infusions:  sodium chloride      PRN Meds: sodium chloride , acetaminophen  **OR** acetaminophen , alum & mag hydroxide-simeth, nitroGLYCERIN , ondansetron  **OR** ondansetron  (ZOFRAN ) IV, oxyCODONE , sodium chloride  flush   Vital Signs    Vitals:   01/03/24 2321 01/04/24 0325 01/04/24 0803 01/04/24 0840  BP: 111/85 107/78 106/78 106/78  Pulse: 85 80 77 78  Resp: 16 19 17    Temp: 98.2 F (36.8 C) 98.5 F (36.9 C) 98.8 F (37.1 C)   TempSrc: Oral Oral Oral   SpO2:  100% 95%   Weight:      Height:        Intake/Output Summary (Last 24 hours) at 01/04/2024 0846 Last data filed at 01/04/2024 0300 Gross per 24 hour  Intake 1861.93 ml  Output 860 ml  Net 1001.93 ml      12/31/2023   11:16 PM 12/04/2023   10:02 AM 10/26/2023    2:16 AM  Last 3 Weights  Weight (lbs) 215 lb 217 lb 215 lb  Weight (kg) 97.523 kg 98.431 kg 97.523 kg     Telemetry    NSR - Personally Reviewed  ECG    NSR RBBB prior inferior infarct - Personally Reviewed  Physical Exam   GEN: No acute distress.  HEENT: Normocephalic, atraumatic, sclera non-icteric. Neck: No JVD or  bruits. Cardiac: RRR no murmurs, rubs, or gallops.  Respiratory: Clear to auscultation bilaterally. Breathing is unlabored. GI: Soft, nontender, non-distended, BS +x 4. MS: no deformity. Extremities: No clubbing or cyanosis. No edema. Distal pedal pulses are 2+ and equal bilaterally. Right radial cath site without hematoma or ecchymosis; good pulse. Neuro:  AAOx3. Follows commands. Psych:  Responds to questions appropriately with a normal affect.  Labs    High Sensitivity Troponin:   Recent Labs  Lab 01/01/24 2026 01/02/24 0850 01/02/24 1049 01/02/24 2343 01/03/24 0142  TROPONINIHS 1,920* 5,190* 4,671* 2,506* 2,254*      Cardiac EnzymesNo results for input(s): TROPONINI in the last 168 hours. No results for input(s): TROPIPOC in the last 168 hours.   Chemistry Recent Labs  Lab 01/02/24 0423 01/03/24 0142 01/04/24 0451  NA 136 138 138  K 4.0 4.1 3.9  CL 104 109 108  CO2 24 24 22   GLUCOSE 149* 139* 119*  BUN 28* 25* 24*  CREATININE 1.84* 1.90* 1.95*  CALCIUM  8.4* 8.6* 8.5*  PROT 6.0*  --   --   ALBUMIN 2.9*  --   --  AST 41  --   --   ALT 20  --   --   ALKPHOS 54  --   --   BILITOT 0.6  --   --   GFRNONAA 39* 38* 37*  ANIONGAP 8 5 8      Hematology Recent Labs  Lab 01/02/24 0423 01/03/24 0142 01/04/24 0451  WBC 7.9 6.9 6.2  RBC 3.57* 3.64* 3.29*  HGB 11.4* 11.7* 10.6*  HCT 32.0* 32.9* 29.5*  MCV 89.6 90.4 89.7  MCH 31.9 32.1 32.2  MCHC 35.6 35.6 35.9  RDW 12.8 13.0 13.1  PLT 204 196 177    BNPNo results for input(s): BNP, PROBNP in the last 168 hours.   DDimer No results for input(s): DDIMER in the last 168 hours.   Radiology    CARDIAC CATHETERIZATION Result Date: 01/03/2024   RPAV lesion is 50% stenosed.   Prox LAD to Mid LAD lesion is 50% stenosed.   Dist LAD lesion is 60% stenosed.   Ramus lesion is 40% stenosed.   1st Mrg lesion is 40% stenosed.   2nd Mrg lesion is 40% stenosed.   Prox RCA lesion is 99% stenosed.   Ost LM lesion is  20% stenosed.   Mid RCA to Dist RCA lesion is 55% stenosed.   A drug-eluting stent was successfully placed using a STENT SYNERGY XD 2.75X16.   Post intervention, there is a 0% residual stenosis.   In the absence of any other complications or medical issues, we expect the patient to be ready for discharge from an interventional cardiology perspective on 01/04/2024.   Recommend uninterrupted dual antiplatelet therapy with Aspirin  81mg  daily and Ticagrelor  90mg  twice daily for a minimum of 12 months (ACS-Class I recommendation). 1.  Severe one-vessel coronary artery disease with evidence of plaque rupture in the proximal right coronary artery with TIMI-2 flow.  This seems to be the culprit for non-STEMI.  Patent distal RCA stent with moderate in-stent restenosis.  Stable moderate disease affecting the LAD and left circumflex. 2.  Left ventricular angiography was not performed.  EF was mildly reduced by echo. 3.  Moderately elevated left ventricular end-diastolic pressure at 26 mmHg. 4.  Successful angioplasty and drug-eluting stent placement to the proximal right coronary artery. Recommendations: Aggressive treatment of risk factors.  Given elevated LVEDP, I stopped IV fluids.  Monitor renal function postcontrast exposure.  Only 40 mL of contrast was used for the procedure.   ECHOCARDIOGRAM LIMITED Result Date: 01/02/2024    ECHOCARDIOGRAM LIMITED REPORT   Patient Name:   Hayden Williams Date of Exam: 01/02/2024 Medical Rec #:  [id number]      Height:       71.0 in Accession #:    [id number]     Weight:       215.0 lb Date of Birth:  1956-03-19      BSA:          2.174 m Patient Age:    68 years       BP:           115/51 mmHg Patient Gender: M              HR:           72 bpm. Exam Location:  Inpatient Procedure: Limited Echo, Cardiac Doppler and Color Doppler (Both Spectral and            Color Flow Doppler were utilized during procedure). Indications:    Elevated Troponin  History:  Patient has prior history  of Echocardiogram examinations, most                 recent 10/26/2023. Previous Myocardial Infarction, CAD and                 Angina, Stroke and CKD, stage 3, Signs/Symptoms:Chest Pain; Risk                 Factors:Hypertension and Diabetes.  Sonographer:    Thea Norlander RCS Referring Phys: [phone] A CALDWELL POWELL JR IMPRESSIONS  1. Left ventricular ejection fraction, by estimation, is 50%. The left ventricle has mildly decreased function. The left ventricle demonstrates regional wall motion abnormalities with basal inferoseptal severe hypokinesis, basal to mid inferior akinesis, and basal inferolateral severe hypokinesis. Left ventricular diastolic parameters are consistent with Grade I diastolic dysfunction (impaired relaxation).  2. Right ventricular systolic function is mildly reduced. The right ventricular size is mildly enlarged. Tricuspid regurgitation signal is inadequate for assessing PA pressure.  3. The mitral valve is normal in structure. Trivial mitral valve regurgitation. No evidence of mitral stenosis.  4. The aortic valve is tricuspid. Aortic valve regurgitation is not visualized. No aortic stenosis is present.  5. The inferior vena cava is normal in size with greater than 50% respiratory variability, suggesting right atrial pressure of 3 mmHg. FINDINGS  Left Ventricle: Left ventricular ejection fraction, by estimation, is 50%. The left ventricle has mildly decreased function. The left ventricle demonstrates regional wall motion abnormalities. The left ventricular internal cavity size was normal in size. There is no left ventricular hypertrophy. Left ventricular diastolic parameters are consistent with Grade I diastolic dysfunction (impaired relaxation). Right Ventricle: The right ventricular size is mildly enlarged. Right ventricular systolic function is mildly reduced. Tricuspid regurgitation signal is inadequate for assessing PA pressure. Mitral Valve: The mitral valve is normal in  structure. Trivial mitral valve regurgitation. No evidence of mitral valve stenosis. Tricuspid Valve: The tricuspid valve is normal in structure. Tricuspid valve regurgitation is not demonstrated. Aortic Valve: The aortic valve is tricuspid. Aortic valve regurgitation is not visualized. No aortic stenosis is present. Aortic valve peak gradient measures 3.9 mmHg. Pulmonic Valve: The pulmonic valve was normal in structure. Pulmonic valve regurgitation is trivial. Aorta: The aortic root is normal in size and structure. Venous: The inferior vena cava is normal in size with greater than 50% respiratory variability, suggesting right atrial pressure of 3 mmHg. LEFT VENTRICLE PLAX 2D LVIDd:         4.50 cm   Diastology LVIDs:         3.00 cm   LV e' medial:    5.11 cm/s LV PW:         1.00 cm   LV E/e' medial:  8.8 LV IVS:        0.90 cm   LV e' lateral:   6.59 cm/s LVOT diam:     2.30 cm   LV E/e' lateral: 6.8 LV SV:         65 LV SV Index:   30 LVOT Area:     4.15 cm  RIGHT VENTRICLE            IVC RV S prime:     6.34 cm/s  IVC diam: 1.70 cm TAPSE (M-mode): 2.0 cm LEFT ATRIUM         Index LA diam:    3.90 cm 1.79 cm/m  AORTIC VALVE AV Area (Vmax): 3.26 cm AV Vmax:  98.75 cm/s AV Peak Grad:   3.9 mmHg LVOT Vmax:      77.55 cm/s LVOT Vmean:     51.650 cm/s LVOT VTI:       0.156 m  AORTA Ao Root diam: 3.70 cm Ao Asc diam:  3.40 cm MITRAL VALVE MV Area (PHT): 4.06 cm    SHUNTS MV Decel Time: 187 msec    Systemic VTI:  0.16 m MV E velocity: 45.10 cm/s  Systemic Diam: 2.30 cm MV A velocity: 75.50 cm/s MV E/A ratio:  0.60 Dalton McleanMD Electronically signed by Ezra Kanner Signature Date/Time: 01/02/2024/3:15:35 PM    Final     Cardiac Studies   2d echo 01/02/24  1. Left ventricular ejection fraction, by estimation, is 50%. The left  ventricle has mildly decreased function. The left ventricle demonstrates  regional wall motion abnormalities with basal inferoseptal severe  hypokinesis, basal to mid  inferior  akinesis, and basal inferolateral severe hypokinesis. Left ventricular  diastolic parameters are consistent with Grade I diastolic dysfunction  (impaired relaxation).   2. Right ventricular systolic function is mildly reduced. The right  ventricular size is mildly enlarged. Tricuspid regurgitation signal is  inadequate for assessing PA pressure.   3. The mitral valve is normal in structure. Trivial mitral valve  regurgitation. No evidence of mitral stenosis.   4. The aortic valve is tricuspid. Aortic valve regurgitation is not  visualized. No aortic stenosis is present.   5. The inferior vena cava is normal in size with greater than 50%  respiratory variability, suggesting right atrial pressure of 3 mmHg.   Cath 01/03/24    RPAV lesion is 50% stenosed.   Prox LAD to Mid LAD lesion is 50% stenosed.   Dist LAD lesion is 60% stenosed.   Ramus lesion is 40% stenosed.   1st Mrg lesion is 40% stenosed.   2nd Mrg lesion is 40% stenosed.   Prox RCA lesion is 99% stenosed.   Ost LM lesion is 20% stenosed.   Mid RCA to Dist RCA lesion is 55% stenosed.   A drug-eluting stent was successfully placed using a STENT SYNERGY XD 2.75X16.   Post intervention, there is a 0% residual stenosis.   In the absence of any other complications or medical issues, we expect the patient to be ready for discharge from an interventional cardiology perspective on 01/04/2024.   Recommend uninterrupted dual antiplatelet therapy with Aspirin  81mg  daily and Ticagrelor  90mg  twice daily for a minimum of 12 months (ACS-Class I recommendation).   1.  Severe one-vessel coronary artery disease with evidence of plaque rupture in the proximal right coronary artery with TIMI-2 flow.  This seems to be the culprit for non-STEMI.  Patent distal RCA stent with moderate in-stent restenosis.  Stable moderate disease affecting the LAD and left circumflex. 2.  Left ventricular angiography was not performed.  EF was mildly reduced  by echo. 3.  Moderately elevated left ventricular end-diastolic pressure at 26 mmHg. 4.  Successful angioplasty and drug-eluting stent placement to the proximal right coronary artery.   Recommendations: Aggressive treatment of risk factors.  Given elevated LVEDP, I stopped IV fluids.  Monitor renal function postcontrast exposure.  Only 40 mL of contrast was used for the procedure.   Patient Profile     68 y.o. male with CAD (unstable angina s/p DES to distal RCA in 11/2014), diabetes, hypertension, hyperlipidemia, stage IV chronic kidney disease, RBBB, prior GIB 2020 (esophagitis/esophageal ulcer), nonhemorrhagic infarct by MRI head 10/2023 and sleep apnea  admitted with NSTEMI.   Assessment & Plan    1. CAD with NSTEMI - peak troponin 5,190 - cath 01/03/24 showing plaque rupture in proximal RCA tx with PTCA/DES, with moderate ISR of dRCA, stable moderate disease in LAD/LCx - continue ASA 81mg  daily, Brilinta  90mg  BID, atorvastatin  80mg  daily, ezetimibe  10mg  daily, amlodipine  10mg  daily, metoprolol  100mg  BID - will discuss med plan re: continuation of new Imdur  at discharge, as well as recommendations regarding valsartan -hydrochlorothiazide  post-cath at DC  2. Ischemic cardiomyopathy, HTN - 2D echo showed EF 50% with basal inferoseptal severe hypokinesis, basal to mid inferior  akinesis, and basal inferolateral severe hypokinesis  - LVEDP 26 in setting of diuresis, diuretics not given due to CKD - volume status looks OK  3. Anemia - remote hx GIB 2020 in setting of esophagitis/ulcer disease - slight downtrend of Hgb noted, suspect post-procedurally/related to dilution from IVF - can recheck as OP  4. CKD stage IV - last Cr 2.49 in 10/2023, admitted at 2.49 s/p hydration -> post cath at 1.95 (this may actually be falsely improved from baseline given hydration and holding of ARB/hydrochlorothiazide )  5. HLD - lipids well controlled on present regimen  Close TOC f/u arranged next  available date Monday 12/15/23 in clinic (historical APPs not available during this timeframe). Added NSTEMI dc instructions to AVS. Will need MD commentary about trip to [place], leaving 9/16. Dr. Santo will round this AM. Also awaiting cardiac rehab to see prior to DC.  For questions or updates, please contact [facility] Please consult [email or web address] for contact info under Cardiology/STEMI.  Signed, Raphael LOISE Bring, PA-C 01/04/2024, 8:46 AM     I have personally seen and examined the patient.  My HPI, Exam, and assessment and plan are below, independent of the NPP above.  Patient notes no CP, SOB, palpitations, or syncope.  Exam notable for  Gen: No distress  Neck: No JVD Cardiac: No Rubs or Gallops, no Murmur, RRR +2 no radial bruit Respiratory: Clear to auscultation bilaterally, normal effort, normal  respiratory rate GI: Soft, nontender, non-distended  MS: No  edema;  moves all extremities Integument: Skin feels warm Neuro:  At time of evaluation, alert and oriented to person/place/time/situation  Psych: Normal affect, patient feels well  Tele: SR In assessment and plan:  CAD with NSTEMI- given recent stroke, angina, CKD and WMA, planned for DAPT with brulitna for one year, statin and zetia  (needs refill) norvasc , and BB without plans for Diovan  due to CKD IV Appears to be euvolemic - anemia is likely dilutional, if bleed; in one month we would switch to Brilinta  monotherapy - Ok for cardiac rehab when back form [place] - if no clinical change from today and dose well, he should meet current airline guidelines for safe flight - all questions answered, reviewed care with brother McCleary, nursing, and CR  Stanly Santo, MD FASE [facility] Cardiologist [facility]  [address] [place], [place] [address] [phone]  12:07 PM    Stanly Santo, MD FASE [facility] Cardiologist [facility]  [address] [place], [address] [place],  [place] [address] [phone]  12:00 PM

## 2024-01-05 NOTE — Telephone Encounter (Signed)
 Patient contacted regarding discharge from [facility] on 01/04/24  Patient understands to follow up with Orren Fabry, PA-C on 01/15/24 at 8:25 am Patient understands discharge instructions? Yes  Patient understands medications and regiment? Yes Patient understands to bring all medications to this visit? Yes Pt has questions about the reason for the IMDUR  and how long he will be taking.  Will discuss at visit.   Feeling well.  Dressing to wrist is cdi, he will removed when he showers today.  Appreciative for the call.

## 2024-01-06 LAB — LIPOPROTEIN A (LPA): Lipoprotein (a): 80.3 nmol/L — ABNORMAL HIGH (ref <75.0)

## 2024-01-10 ENCOUNTER — Telehealth (HOSPITAL_COMMUNITY): Payer: Self-pay | Admitting: *Deleted

## 2024-01-10 NOTE — Telephone Encounter (Signed)
 Received referral from Dr. Verlin for this pt to participate in Outpatient cardiac rehab phase II s/p 8/26 Nstemi and 8/27 DES RCA.  Called and left message requesting call back.  Contact information provided. Saturnino Bernett PEAK, BSN Cardiac and Emergency planning/management officer

## 2024-01-10 NOTE — Progress Notes (Signed)
 Date of Service: 01/10/2024 Patient DOB: [birth year]   Subjective:     Patient ID: Hayden Williams is a 68 y.o. male. Patient comes in for Medicare Annual Wellness Visit Subsequent (Not fasting, decline flu shot/), Annual Exam, Hyperlipidemia, Hypertension, Vitamin D Deficiency, Sleep Apnea, and Diabetes The patient is a 68yo male who presents for annual Medicare wellness visit, physical,  and medical evaluation. The patient has a history of:     -hypertension: Currently on amlodipine  10 mg daily, metoprolol  tartrate 100 mg twice a day, isosorbid   -dyslipidemia: Currently on atorvastatin  80 mg daily and Zetia  10 mg daily    -diabetes mellitus with proteinuria: was seeing endocrinologist Dr. Ozell Altheimer but did get new endocrinologist Dr. Dale on 08/16/21, saw eye doctor [name] OD for the diabetic eye exam on 07/01/2020.          Taking Novolog  on sliding scale 3 units above 150, 4 units above 170, 5 units above 190 and 6 units above 210.  Also on Mounjaro 7.5 mg weekly and Toujeo  44units in morning.           A1C: 06/25 7.7        microalb: 08/16/21 neg          Eye exam: 2025 [facility];        Foot exam: 01/10/24     -coronary artery disease: status post stent placement seeing cardiology at [facility], currently on  Brillenta, asa 81mg , and isosorbide ;        -iron deficiency anemia: Currently on iron supplements once a day.        -vitamin D def:  on vitamin D supplement (2,000IU daily), seeing nephrology          -chronic renal failure stage III with secondary hyperparathyroidism: (saw [facility] in the past), currently seeing Dr. Rockwell Alston.           ?          -obstructive sleep apnea where he was on cpap [facility] and due for sleep study) but now not using;      -elevated PSA level with family history of prostate cancer: seeing urology Dr. Alm Stain and going to undergo prostate biopsy.  08/24 (5.76 from  6.28 from 5.22) and free psa 2.4 from 2.4.    Also on Flomax ;     -history of prior GI bleed from esophageal ulcer (possibly from asa and plavix , and not on any NSAIDs) with history of GERD:         -GERD: Currently on Protonix  40 mg daily.       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        C-scope: 04/11/22 with a sessile serrated lesion removed at [facility] by Dr. Alm Dames with rec to repeat in 5 years per pt.           PSA: being checked by urology;         Dexa: No recent bone fractures.  Vaccinations: Patient has Prevnar 20 and also got both Shingrix shots.  Currently due now for tetanus, and and flu vaccines (Declined both).         Hep C screening: 09/22 neg                   Labs:          06/25 normal lipids, A1c 7.7, negative HIV, negative alcohol, normal CBC, CMP had elevated blood sugar 110, elevated creatinine at 2.49 from 2.7 from 2.19         08/24 patient's PSA level did decrease some but is still elevated (5.76 from 6.28) where his urologist was sent the PSA results, CBC shows mildly low hemoglobin (13.1 from 12.9) which is chronic and stable with normal iron.  Patient's uric acid level was normal at 5.1.  Patient's cholesterol had mildly elevated triglycerides (181) where patient's CMP showed elevated blood sugars with a history of diabetes, kidney function was low which is chronic but slightly lower which is likely secondary to being dehydrated from fasting (creatinine 2.41 from 2.26).         06/24 elevated iPTH 176, normal vitamin D         03/24 CBC shows mildly low hemoglobin which is chronic and stable (12.9 from 13.2) with normal iron.   Patient's PSA for his prostate was elevated (5.22).  Patient has seen urology in the past would recommend for the patient to call and make an appointment and make sure that the urologist has current PSA level.         08/10/21 a1c of 8.9 with normal microalb;          09/22 hepatitis C, cholesterol, iron, and vitamin D labs are normal.  Patient's CMP had elevated creatinine which has been elevated in the past and recommend to continue to follow-up with your nephrologist as directed.  uric acid level is normal.  Patient's  cholesterol had slightly low good cholesterol (HDL) and will continue to observe.          Misc:          Review of Systems Review of Systems  Constitutional:  Negative for chills, fatigue and fever.  HENT:  Negative for congestion, hearing loss, rhinorrhea and sore throat.   Eyes:  Negative for pain and visual disturbance.  Respiratory:  Negative for cough, shortness of breath and wheezing.   Cardiovascular:  Negative for chest pain, palpitations and leg swelling.  Gastrointestinal:  Negative for abdominal distention, abdominal pain, anal bleeding, blood in stool, constipation, diarrhea, nausea, rectal pain and vomiting.  Genitourinary:  Negative for decreased urine volume, dysuria and hematuria.  Musculoskeletal:  Negative for arthralgias, back pain, gait problem, joint swelling, myalgias, neck pain and neck stiffness.  Skin:  Negative for rash and wound.  Neurological:  Negative for dizziness, seizures, syncope, weakness and headaches.  Hematological:  Negative for adenopathy. Does not bruise/bleed easily.  Psychiatric/Behavioral:  Negative for agitation, behavioral problems, sleep disturbance and suicidal ideas. The patient is not nervous/anxious.      Medical History[1]  Surgical History[2]    Allergies[3]    Social History   Social History Narrative     Education: MBA.  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  Marital Status: Married Alica Rattler).  Children: Three sons.   Lives With: Wife.    Family History[4]    Objective:  BP 126/77  Pulse 79   Temp 99 F (37.2 C) (Oral)   Resp 16   Ht 1.78 m (5' 10.08)   Wt 99.1 kg (218 lb 6.4 oz)   SpO2 98%   BMI 31.27 kg/m   Physical Exam Physical Exam Constitutional:      General: He is not in acute distress.    Appearance: Normal appearance. He is not ill-appearing.  HENT:     Head: Normocephalic and atraumatic.     Right Ear: Tympanic membrane, ear canal and external ear normal. There is no impacted cerumen.     Left Ear: Tympanic membrane, ear canal and external ear normal. There is no impacted cerumen.     Nose: Nose normal. No congestion or rhinorrhea.     Mouth/Throat:     Mouth: Mucous membranes are moist.     Pharynx: No oropharyngeal exudate or posterior oropharyngeal erythema.  Eyes:     General: No scleral icterus.       Right eye: No discharge.        Left eye: No discharge.     Extraocular Movements: Extraocular movements intact.     Conjunctiva/sclera: Conjunctivae normal.     Pupils: Pupils are equal, round, and reactive to light.  Neck:     Vascular: No carotid bruit.  Cardiovascular:     Rate and Rhythm: Normal rate and regular rhythm.     Heart sounds: No murmur heard. Pulmonary:     Effort: Pulmonary effort is normal. No respiratory distress.     Breath sounds: Normal breath sounds. No stridor. No wheezing, rhonchi or rales.  Chest:     Chest wall: No tenderness.  Abdominal:     General: There is no distension.     Palpations: Abdomen is soft. There is no mass.     Tenderness: There is no abdominal tenderness. There is no right CVA tenderness, left CVA tenderness, guarding or rebound.     Hernia: No hernia is present.  Musculoskeletal:        General: No swelling, tenderness, deformity or signs of injury.     Cervical back: Neck supple. No tenderness.      Right lower leg: No edema.     Left lower leg: No edema.  Lymphadenopathy:     Cervical: No cervical adenopathy.  Skin:    Coloration: Skin is not jaundiced or pale.     Findings: No bruising, erythema, lesion or rash.  Neurological:     General: No focal deficit present.     Mental Status: He is alert and oriented to person, place, and time.     Cranial Nerves: No cranial nerve deficit.     Motor: No weakness.     Gait: Gait normal.  Psychiatric:        Mood and Affect: Mood normal.        Behavior: Behavior normal.        Thought Content: Thought content normal.        Judgment: Judgment normal.     Diabetic Foot Exam Performed: Yes Diabetic Foot Exam Result:  [x]  Normal The patient's foot has been visually inspected with no ulcers, deformities, or abnormal toenails.  The patient can see the bottom of his/her feet and shoes fit properly. The patient can feel the 10g nylon filament on both feet for the 1st, 3rd, 5th metatarsals and big toes.  The patient's pedal pulses are palpable on both feet at dorsalis pedis and/or posterior tibial.  (If normal selected  no need to complete below this line.) [x]  Abnormal  []  Abnormal with neuropathy []  Bilateral amputation lower extremities   Foot Exam  Yes  No  Comment  Is there a foot ulcer now?  []  []    Is there a history of foot ulcers? []  []    Is there toe deformity? []  []    Is there abnormal shape?  []  []    Are toenails thick or ingrown? []  []    Can patient see bottom of feet? []  []    Are patients shoes improperly fitting?  []  []                                                                                                                                                                                      Right Foot  Normal Abnormal Comment  Monofilament Left Foot Normal Abnormal Comment  5th Metatarsal []  []     5th Metatarsal []  []    3rd Metatarsal []  []    3rd Metatarsal []  []    1st Metatarsal []  []    1st Metatarsal []  []    Big  Toe []  []    Big Toe []  []     Pulses Assessment/Exam Palpable  Doppler Absent N/A - amputation   Comment  Dorsalis pedis - R []  []  []  []    Dorsalis pedis - L []  []  []  []    Posterior tibial - R []  []  []  []    Posterior tibial - L []  []  []  []         Labs: No results found for this or any previous visit (from the past week).  Assessment/Plan:  1. Encounter for Medicare annual wellness exam (Primary) Medicare questions were answered and reviewed.  2. Physical exam Will call with lab results.  3. Essential hypertension controlled, continue current medication  4. Dyslipidemia Obtain fasting labs now.  Continue atorvastatin  and Zetia  as directed. Recommend a low fat/carbohydrate diet with increase in exercising.   5. Type 2 diabetes mellitus with stage 3a chronic kidney disease, with long-term current use of insulin     (CMD) Continue medication follow-up with endocrinology as directed. Recommend a low fat/carbohydrate diet with increase in exercising.   6. Coronary artery disease involving native coronary artery of native heart without angina pectoris Continue medication follow with cardiology as directed.  7. Iron deficiency anemia, unspecified iron deficiency anemia type Continue iron supplement and obtain labs now. - Anemia Profile  8. Vitamin D deficiency, unspecified Continue vitamin D supplement and follow-up with nephrology as directed.  9. Chronic renal failure, stage 3b (CMD) Follow-up with nephrology as directed.  10. OSA (obstructive sleep apnea) Recommend to follow-up with neurology to get repeat sleep study done.  11.  Elevated PSA Follow-up with your urologist as directed.  12. H/O: gout Continue allopurinol  but call for any gouty flareups.  13. GERD without esophagitis Controlled, continue Protonix  as directed.  14. H/O: CVA (cerebrovascular accident) Continue Brilinta .  Will continue to try and control patient's blood pressure, blood sugars, and  cholesterol.  Obtain labs (lipids, liver enzymes, iron) now.  Follow-up in 6 months or sooner if needed.  Call for questions/concerns.   Return in 1 year (on 01/10/2025) for Medicare Annual Wellness Visit .  Electronically signed by: Shawn P Lazoff, DO 01/10/2024 10:50 AM  This document was created using the aid of voice recognition Dragon dictation software.    [facility] Medicare Wellness Visit Type:: Subsequent Annual Wellness Visit  Name: Hayden Williams Date of Birth: 10-Mar-1956 Age: 68 y.o. MRN: [id number] Visit Date: 01/10/2024  History obtained from: patient  Living Arrangements/Support System/Health Assessment/Pain/Stress Marital status: widowed Number of children: 3 Occupation: retired Living arrangements: (!) lives alone Does the patient have a support system (family, friend, church, Conservation officer, nature, etc)?: Yes   Do you have any dental concerns?: No In the past month, have you experienced a change in your bladder control?: No   Do you have any difficulty obtaining your medications?: No   Do you have trouble consistently taking or remembering to take all of your medications as prescribed?: No Patient rates overall stress level as: None Does stress affect daily life?: No Typical amount of pain: none Does pain affect daily life?: No Are you currently prescribed opioids?: No                Depression Screening  Behavioral Health Screening  Patient Health Questionnaire-2 Score: 0 (01/10/2024 10:40 AM)      Patient's Depression screening/score = Negative    Depression Plan: Normal/Negative Screening    Social History (Tobacco/Drugs/Sexual Activity) Napoleon reports that he has never smoked. He has never been exposed to tobacco smoke. He has never used smokeless tobacco. Tobacco Use?: No How many times in the past year have you used a recreational drug or used a prescription medication for nonmedical  reasons?: None Risk factors for sexually transmitted infections (i.e., multiple sexual partners): No Are you bothered by sexual problems?: No  Alcohol Screening How often do you have a drink containing alcohol?: Never How many standard drinks containing alcohol do you have on a typical day?: Never, 1 or 2 drinks How often do you have six or more drinks on one occasion?: Never Audit-C Score: 0  Physical Activity Regular exercise?: (!) No      Diet How many meals a day?: 2 Eats fruit and vegetables daily?: Yes Most meals are obtained by: preparing their own meals, eating out  Home and Transportation Safety All rugs have non-skid backing?: Yes All stairs or steps have railings?: Yes Grab bars in the bathtub or shower?: (!) No Have non-skid surface in bathtub or shower?: Yes Good home lighting?: Yes Regular seat belt use?: Yes      Activities of Daily Living Feed self?: Yes Bathe self?: Yes Dress self?: Yes Use toilet without assistance?: Yes Walk without assistance?: Yes    Instrumental Activities of Daily Living Manage finances?: Yes Shop for themselves?: Yes Prepare meals?: Yes Use the telephone?: Yes Manage medications?: Yes   Performs basic housework/laundry?: Yes Drives?: Yes Primary transportation is: driving  Hearing Concerns about hearing?: No Uses hearing aids?: No Hear whispered voice? (Observed): Yes  Vision  Concerns about vision?: No    Fall Risk Is the patient ambulatory?: Yes One or more falls in the last year:: No Feels unsteady when walking:: No  Cognitive Assessment Has a diagnosis of dementia or cognitive impairment?: No Are there any memory concerns by the patient, others, or providers?: No              Advance Directives Living will?: Yes Advance directive information provided to patient: Yes Healthcare POA?: Yes Name of Health Care Agent: Olam Mania Relationship to patient: Sibling Health Care Agent's phone number:  [phone]         Other History I reviewed and updated the following risk factors and conditions as appropriate: Reviewed/Updated: Problem List, Medical History, Surgical History, Family History, Medications, Allergies Reviewed/Updated: Vital Signs (height, weight, and BP), Immunizations, Health Maintenance Patient Care Team Updated: Done  Vital Signs BP 126/77   Pulse 79   Temp 99 F (37.2 C) (Oral)   Resp 16   Ht 1.78 m (5' 10.08)   Wt 99.1 kg (218 lb 6.4 oz)   SpO2 98%   BMI 31.27 kg/m   Screening and Immunizations Health Maintenance Status       Date Due Completion Dates   Diabetes:  Quantitative uACR for Kidney Evaluation Never done ---   Adult RSV (60+ Years or Pregnancy) (1 - Risk 60-74 years 1-dose series) Never done ---   DTaP/Tdap/Td Vaccines (2 - Td or Tdap) 07/07/2020 07/08/2010   COVID-19 Vaccine (5 - 2025-26 season) 01/08/2024 05/26/2020, 05/11/2020   Influenza Vaccine (1) 12/08/2023 01/30/2018   Diabetes: Foot Exam 01/05/2024 01/05/2023, 07/28/2021   Diabetes: Retinopathy Screening Combo 03/22/2024 03/23/2023, 12/26/2021   Diabetes:  eGFR for Kidney Evaluation 07/27/2024 07/28/2023, 04/25/2023   Diabetes: Hemoglobin A1C 11/19/2024 11/20/2023, 11/20/2023   Depression Screening 11/19/2024 11/20/2023   Comprehensive Annual Visit 01/09/2025 01/10/2024, 01/05/2023   Medicare Annual Wellness (AWV) Subsequent Visits 01/09/2025 01/10/2024, 01/05/2023   Colorectal Cancer Screening 04/11/2032 04/11/2022, 03/28/2017   Comment on 01/01/2014: See [facility]       Immunization History  Administered Date(s) Administered  . Influenza, Unspecified 01/30/2018  . Moderna SARS-CoV-2 Primary Series 12+ yrs 05/26/2020  . Pfizer SARS-CoV-2 Primary Series 12+ yrs 08/03/2019, 08/27/2019, 05/11/2020  . Pneumococcal Conjugate 13-Valent 12/24/2020  . Pneumococcal Conjugate Vaccine 20-Valent (PREVNAR-20) 6 wks+ 12/27/2021  . TDAP VACCINE (BOOSTRIX,ADACEL) 7Y+ 07/08/2010  . Varicella Zoster  [facility]) 18Y+ 01/26/2016, 12/24/2020    Assessment/Plan: Subsequent Annual Wellness Visit: The topics above were reviewed with the patient.  Healthy lifestyle principles reviewed.  Recommendations provided when indicated.  Follow up 1 year for next wellness visit.  No orders of the defined types were placed in this encounter.   New Medications Ordered This Visit  Medications  . isosorbide  mononitrate (IMDUR ) 30 mg 24 hr tablet    Sig: Take 30 mg by mouth daily.    Patient Care Team: Shawn P Lazoff, DO as PCP - General (Family Medicine) Shawn P Lazoff, DO as PCP - Attributed  Electronically signed by: Shawn P Lazoff, DO 01/10/2024 10:50 AM       [1] Past Medical History: Diagnosis Date  . Diabetes mellitus    (CMD)   . Hypercholesterolemia   . Nephrolithiasis    He was seen in ER in [place]  with a kidney stone in 4/17.  SABRA Periodontal disease   . Sleep apnea    suspected for a long time by his wife who has noted snoring, waking up gasping for air, difficulty  waking up from sleep, and daytime somnolence.  [facility] did sleep testing in 6/17; to start CPAP  [2] Past Surgical History: Procedure Laterality Date  . ANKLE FRACTURE SURGERY     Procedure: ANKLE FRACTURE SURGERY  . CARDIAC CATHETERIZATION    . CATARACT EXTRACTION Right    Procedure: CATARACT EXTRACTION  [3] Allergies Allergen Reactions  . Peanut Anaphylaxis and Swelling  . Lisinopril Other (See Comments)    Increased bun and creatinine  . Niacin Other (See Comments)    unknown  [4] Family History Problem Relation Name Age of Onset  . Diabetes Mother    . Heart attack Mother    . Diabetes Father    . Pancreatic cancer Father    . Diabetes Sister    . Diabetes Brother    . Diabetes Son

## 2024-01-14 NOTE — Progress Notes (Unsigned)
 Cardiology Office Note   Date:  01/15/2024  ID:  Hayden Williams, DOB 04/23/1956, MRN [id number] PCP: Lazoff, Shawn P, DO  [facility] Providers Cardiologist:  Stanly DELENA Leavens, MD Cardiology APP:  Madie Jon Garre, PA   History of Present Illness Hayden Williams is a 68 y.o. 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  Was recently in the hospital since developing substernal chest pain on 8/24 which was unresponsive to nitroglycerin  and went to [facility].  Troponins were drawn, 119>> 108>> 157, EKG showed normal sinus rhythm with old RBBB.  Bun/creatinine 46/2.4.  Started on heparin  in the ED.  Echocardiogram showed LVEF 50% decreased function with left ventricular regional wall motion, inferior septal severe hypokinesis, basal to mid inferior akinesis, basal inferior lateral hypokinesis in addition to grade 1 DD.  Cardiac catheterization was arranged and a DES was placed to the right coronary artery.  Today, he presents with coronary artery disease and recent stent placement for follow-up regarding his cardiac health and medication management.  He recently underwent cardiac catheterization and stent placement. Since starting isosorbide  and Brilinta , he experiences a cough when lying down at night and indigestion or the need to burp, particularly when trying to sleep.  His creatinine level is currently 2.02 with a GFR of 35, having been 1.95 at hospital discharge and as high as 2.7 two months ago. He is scheduled to see his nephrologist soon.  He experienced a gastrointestinal bleed in the past while on Plavix  and aspirin , leading to  the discontinuation of aspirin  and initiation of Protonix . He continues Protonix  with no further bleeding issues.  His diabetes is well-controlled with a recent A1c of 7.3, and he uses a Dexcom device for glucose monitoring.  He is trying to schedule a biopsy of his prostate for elevated PSA.  We discussed that he will be on dual antiplatelet medications for a year to help keep his stent open and healthy.  I will have to discuss with Dr. Verlin and Dr. Tellis whether interruption can happen before then.  Brilinta  + aspirin  X 1 year at least and then may choose to discontinue Brilinta .  .   ROS: Pertinent ROS in HPI  Studies Reviewed     Cardiac cath 01/03/24 Left Main  Ost LM lesion is 20% stenosed.    Left Anterior Descending  Prox LAD to Mid LAD lesion is 50% stenosed.  Dist LAD lesion is 60% stenosed.    Ramus Intermedius  Ramus lesion is 40% stenosed.    Left Circumflex  Vessel is small.    First Obtuse Marginal Branch  Vessel is small in size.  1st Mrg lesion is 40% stenosed.    Second Obtuse Marginal Branch  Vessel is small in size.  2nd Mrg lesion is 40% stenosed.    Right Coronary Artery  Prox RCA lesion is 99% stenosed. Vessel is the culprit lesion. The lesion is not complex (non high-C). The lesion was not previously treated .  Mid RCA to Dist RCA lesion is 55% stenosed. The lesion was previously treated using a drug eluting stent over 2 years ago. Previously placed stent displays restenosis.    Right Posterior Atrioventricular  Artery  RPAV lesion is 50% stenosed. The lesion is located at the bend and discrete.    Intervention   Prox RCA lesion  Stent  Lesion length: 14 mm. CATH LAUNCHER [id number] guide catheter was inserted. Lesion crossed with guidewire using a WIRE RUNTHROUGH .[id number]. Pre-stent angioplasty was performed using a BALLOON EMERGE MR 2.5X15. Maximum pressure: 10 atm. Inflation time: 20 sec. A drug-eluting stent was successfully placed  using a STENT SYNERGY XD 2.75X16. Maximum pressure: 12 atm. Inflation time: 20 sec. Post-stent angioplasty was performed using a BALLOON SAPPHIRE NC24 3.0X10. Maximum pressure: 14 atm. Inflation time: 20 sec.  Post-Intervention Lesion Assessment  The intervention was successful. Pre-interventional TIMI flow is 2. Post-intervention TIMI flow is 3. No complications occurred at this lesion.  There is a 0% residual stenosis post intervention.     Coronary Diagrams  Diagnostic Dominance: Right  Intervention       Physical Exam VS:  BP 128/80 (BP Location: Left Arm, Patient Position: Sitting, Cuff Size: Normal)   Pulse 65   Ht 5' 11 (1.803 m)   Wt 219 lb 6.4 oz (99.5 kg)   SpO2 96%   BMI 30.60 kg/m        Wt Readings from Last 3 Encounters:  01/15/24 219 lb 6.4 oz (99.5 kg)  12/31/23 215 lb (97.5 kg)  12/04/23 217 lb (98.4 kg)    GEN: Well nourished, well developed in no acute distress NECK: No JVD; No carotid bruits CARDIAC: RRR, no murmurs, rubs, gallops RESPIRATORY:  Clear to auscultation without rales, wheezing or rhonchi  ABDOMEN: Soft, non-tender, non-distended EXTREMITIES:  No edema; No deformity   ASSESSMENT AND PLAN  Atherosclerotic heart disease of native coronary artery, post-DES to RCA Post-stent placement with recent initiation of Brilinta . Cough likely due to recent cold. Plans to travel to [place]. - Continue Brilinta  with a 90-day supply and three refills. - Consider switching to Plavix  if cough persists for more than two weeks. - Encourage participation in cardiac rehab program after returning from [place]. - Advise wearing compression stockings during flight to [place].  Type 2 diabetes mellitus with diabetic chronic kidney disease Diabetes well-controlled with A1c at 7.3. Chronic kidney disease stable with creatinine at 2.02 and GFR at 35.  Hyperlipidemia Lipid panel shows LDL at 16, indicating excellent control.  Essential hypertension -well controlled  today -continue current medications  History of gastrointestinal bleeding GI bleed while on Plavix  and aspirin , now resolved. Currently on Protonix  with no further bleeding issues. - Continue Protonix .  Elevated prostate-specific antigen (PSA) Elevated PSA with postponed biopsy due to recent stroke and stent placement. Concerns about biopsy timing on antiplatelet therapy. - Contact Dr. Verlin and Dr. Santo to discuss the possibility of pausing antiplatelet therapy for biopsy.  Cough, possibly related to recent illness or medication Cough when lying down, likely due to recent cold. No immediate medication changes as cough is not severe. - Monitor cough for resolution over the next one to two weeks.     Cardiac Rehabilitation Eligibility Assessment  The patient is ready to start cardiac rehabilitation from a cardiac standpoint.     Dispo: He can follow-up in 3 months with MD.  Signed, Orren LOISE Fabry, PA-C

## 2024-01-15 ENCOUNTER — Encounter: Payer: Self-pay | Admitting: Physician Assistant

## 2024-01-15 ENCOUNTER — Ambulatory Visit: Payer: Medicare (Managed Care) | Attending: Physician Assistant | Admitting: Physician Assistant

## 2024-01-15 ENCOUNTER — Telehealth: Payer: Self-pay | Admitting: Cardiovascular Disease

## 2024-01-15 VITALS — BP 128/80 | HR 65 | Ht 71.0 in | Wt 219.4 lb

## 2024-01-15 DIAGNOSIS — I2510 Atherosclerotic heart disease of native coronary artery without angina pectoris: Secondary | ICD-10-CM | POA: Diagnosis not present

## 2024-01-15 DIAGNOSIS — N1832 Chronic kidney disease, stage 3b: Secondary | ICD-10-CM

## 2024-01-15 DIAGNOSIS — E1122 Type 2 diabetes mellitus with diabetic chronic kidney disease: Secondary | ICD-10-CM

## 2024-01-15 DIAGNOSIS — Z794 Long term (current) use of insulin: Secondary | ICD-10-CM

## 2024-01-15 DIAGNOSIS — I10 Essential (primary) hypertension: Secondary | ICD-10-CM | POA: Diagnosis not present

## 2024-01-15 DIAGNOSIS — Z9861 Coronary angioplasty status: Secondary | ICD-10-CM

## 2024-01-15 DIAGNOSIS — K921 Melena: Secondary | ICD-10-CM

## 2024-01-15 DIAGNOSIS — E785 Hyperlipidemia, unspecified: Secondary | ICD-10-CM | POA: Diagnosis not present

## 2024-01-15 MED ORDER — ISOSORBIDE MONONITRATE ER 30 MG PO TB24: 30.0000 mg | ORAL_TABLET | Freq: Every day | ORAL | 3 refills | Status: AC

## 2024-01-15 MED ORDER — TICAGRELOR 90 MG PO TABS: 90.0000 mg | ORAL_TABLET | Freq: Two times a day (BID) | ORAL | 11 refills | Status: AC

## 2024-01-15 NOTE — Telephone Encounter (Signed)
 New message   Patient want to switch from Dr Verlin to Dr Santo.  Is this ok?

## 2024-01-15 NOTE — Patient Instructions (Signed)
 Medication Instructions:  Your physician recommends that you continue on your current medications as directed. Please refer to the Current Medication list given to you today.  *If you need a refill on your cardiac medications before your next appointment, please call your pharmacy*  Lab Work: NONE If you have labs (blood work) drawn today and your tests are completely normal, you will receive your results only by: MyChart Message (if you have MyChart) OR A paper copy in the mail If you have any lab test that is abnormal or we need to change your treatment, we will call you to review the results.  Testing/Procedures: NONE  Follow-Up: At [facility], you and your health needs are our priority.  As part of our continuing mission to provide you with exceptional heart care, our providers are all part of one team.  This team includes your primary Cardiologist (physician) and Advanced Practice Providers or APPs (Physician Assistants and Nurse Practitioners) who all work together to provide you with the care you need, when you need it.  Your next appointment:   3 month(s)  Provider:   Lonni Cash, MD  We recommend signing up for the patient portal called MyChart.  Sign up information is provided on this After Visit Summary.  MyChart is used to connect with patients for Virtual Visits (Telemedicine).  Patients are able to view lab/test results, encounter notes, upcoming appointments, etc.  Non-urgent messages can be sent to your provider as well.   To learn more about what you can do with MyChart, go to [email or web address].

## 2024-01-17 ENCOUNTER — Telehealth (HOSPITAL_COMMUNITY): Payer: Self-pay

## 2024-01-17 NOTE — Telephone Encounter (Signed)
 Patient returned call, confirmed interest in program. Will verify insurance and call back to schedule.

## 2024-01-19 ENCOUNTER — Telehealth (HOSPITAL_COMMUNITY): Payer: Self-pay

## 2024-01-19 NOTE — Telephone Encounter (Signed)
 Attempted to call patient to schedule cardiac rehab- no answer, left message. Sent MyChart message.

## 2024-01-19 NOTE — Telephone Encounter (Signed)
 Pt insurance is active and benefits verified through [facility]. Co-pay $10, DED $0/$0 met, out of pocket $3,850/$1,215.74 met, co-insurance 0%. No pre-authorization required for TCR; prior shara is required for ICR. 01/19/2024 @ 10:04am, REF# [id number].  TCR/ICR? TCR Visit(date of service)limitation? No Can multiple codes be used on the same date of service/visit?(IF ITS A LIMIT) N/A  Is this a lifetime maximum or an annual maximum? Annual Has the member used any of these services to date? No Is there a time limit (weeks/months) on start of program and/or program completion? No

## 2024-02-05 ENCOUNTER — Telehealth (HOSPITAL_COMMUNITY): Payer: Self-pay

## 2024-02-05 NOTE — Telephone Encounter (Signed)
 Attempted f/u call regarding cardiac rehab- no answer, left message. Sent MyChart message.  Closing referral.

## 2024-02-26 ENCOUNTER — Encounter (HOSPITAL_COMMUNITY): Payer: Self-pay

## 2024-02-27 ENCOUNTER — Telehealth (HOSPITAL_COMMUNITY): Payer: Self-pay

## 2024-02-27 NOTE — Telephone Encounter (Signed)
 Attempted to confirm cardiac rehab orientation date of 02/29/24 @ 0800.

## 2024-02-28 ENCOUNTER — Telehealth (HOSPITAL_COMMUNITY): Payer: Self-pay

## 2024-02-28 NOTE — Telephone Encounter (Addendum)
 Confirmed cardiac orientation appointment time of 02/29/24 at 0800.  Pt unable to complete cardiac health history over the phone at this time.

## 2024-02-29 ENCOUNTER — Encounter (HOSPITAL_COMMUNITY)
Admission: RE | Admit: 2024-02-29 | Discharge: 2024-02-29 | Disposition: A | Discharge status: Home / Self Care | Payer: Medicare (Managed Care) | Source: Ambulatory Visit | Attending: Internal Medicine | Admitting: Internal Medicine

## 2024-02-29 VITALS — BP 122/78 | HR 83 | Ht 71.0 in | Wt 207.0 lb

## 2024-02-29 DIAGNOSIS — Z955 Presence of coronary angioplasty implant and graft: Secondary | ICD-10-CM | POA: Insufficient documentation

## 2024-02-29 DIAGNOSIS — I214 Non-ST elevation (NSTEMI) myocardial infarction: Secondary | ICD-10-CM | POA: Diagnosis present

## 2024-02-29 NOTE — Progress Notes (Signed)
 Cardiac Individual Treatment Plan  Patient Details  Name: Hayden Williams MRN: [id number] Date of Birth: 1956-01-01 Referring Provider:   Flowsheet Row CARDIAC REHAB PHASE II ORIENTATION from 02/29/2024 in [facility]  Referring Provider Dr. Stanly Leavens MD    Initial Encounter Date:  Flowsheet Row CARDIAC REHAB PHASE II ORIENTATION from 02/29/2024 in [facility]  Date 02/29/24    Visit Diagnosis: 01/02/24 NSTEMI  01/03/24 DES RCA  Patient's Home Medications on Admission:  Current Outpatient Medications:    allopurinol  (ZYLOPRIM ) 100 MG tablet, Take 100 mg by mouth 2 (two) times daily., Disp: , Rfl:    amLODipine  (NORVASC ) 10 MG tablet, Take 10 mg by mouth every evening., Disp: , Rfl: 3   aspirin  EC 81 MG tablet, Take 1 tablet (81 mg total) by mouth daily. To start in 4 weeks once Plavix  and Brillinta course completed, Disp: , Rfl:    atorvastatin  (LIPITOR ) 80 MG tablet, TAKE 1 TABLET BY MOUTH DAILY, Disp: 15 tablet, Rfl: 0   Cholecalciferol (VITAMIN D-3 PO), Take 1 capsule by mouth daily., Disp: , Rfl:    ezetimibe  (ZETIA ) 10 MG tablet, Take 1 tablet (10 mg total) by mouth daily., Disp: 30 tablet, Rfl: 1   insulin  aspart (NOVOLOG  FLEXPEN) 100 UNIT/ML FlexPen, Inject 3-10 Units into the skin See admin instructions. Inject 3-10 units three times daily before each meal, per sliding scale: Under 100: 3 units 100 - 150 : 5 units 151 - 200 : 7 units 201 - 250 : 8 units 251 - 300 : 9 units Over 300 : 10 units, Disp: , Rfl:    insulin  glargine, 1 Unit Dial , (TOUJEO ) 300 UNIT/ML Solostar Pen, Inject 44 Units into the skin daily., Disp: , Rfl:    IRON-VITAMIN C  PO, Take 1 tablet by mouth daily., Disp: , Rfl:    isosorbide  mononitrate (IMDUR ) 30 MG 24 hr tablet, Take 1 tablet (30 mg total) by mouth daily., Disp: 90 tablet, Rfl: 3   metoprolol  tartrate (LOPRESSOR ) 100 MG  tablet, TAKE 1 TABLET BY MOUTH 2 TIMES A DAY, Disp: 60 tablet, Rfl: 0   MOUNJARO 10 MG/0.5ML Pen, Inject 10 mg into the skin once a week., Disp: , Rfl:    nitroGLYCERIN  (NITROSTAT ) 0.4 MG SL tablet, Place 1 tablet (0.4 mg total) under the tongue every 5 (five) minutes as needed for chest pain., Disp: 25 tablet, Rfl: 4   pantoprazole  (PROTONIX ) 40 MG tablet, Take 1 tablet (40 mg total) by mouth daily., Disp: 90 tablet, Rfl: 3   tamsulosin  (FLOMAX ) 0.4 MG CAPS capsule, Take 0.4 mg by mouth daily., Disp: , Rfl:    ticagrelor  (BRILINTA ) 90 MG TABS tablet, Take 1 tablet (90 mg total) by mouth 2 (two) times daily., Disp: 60 tablet, Rfl: 11  Past Medical History: Past Medical History:  Diagnosis Date   CAD (coronary artery disease)    a. 11/2014 Cath/PCI: LM nl, LAD 50p, 60d, RI 40, LCX small, OM1 40, OM2 40, RCA 95d (2.25x20 Promus Premier DES), EF nl.   CKD (chronic kidney disease), stage III (HCC)    Diabetes mellitus (HCC)    Esophageal ulcer    Essential hypertension    GI bleed 06/2018   a. melena/ABL anemia with EGD on 06/27/18 with healed esophageal ulcer, esophagitis, hiatal hernia.   Hyperlipidemia    Sleep apnea     Tobacco Use: Social History  Tobacco Use  Smoking Status Never  Smokeless Tobacco Never    Labs: Review Flowsheet  More data may exist      Latest Ref Rng & Units 09/11/2015 06/26/2018 10/25/2023 10/27/2023 01/02/2024  Labs for ITP Cardiac and Pulmonary Rehab  Cholestrol 0 - 200 mg/dL 846  98  - 893  71   LDL (calc) 0 - 99 mg/dL 92  46  - 53  22   HDL-C >40 mg/dL 35  27  - 27  27   Trlycerides <150 mg/dL 871  876  - 871  889   Hemoglobin A1c 4.8 - 5.6 % - 7.2  - 7.7  7.3   TCO2 22 - 32 mmol/L - - 22  - -    Capillary Blood Glucose: Lab Results  Component Value Date   GLUCAP 103 (H) 01/04/2024   GLUCAP 241 (H) 01/03/2024   GLUCAP 135 (H) 01/03/2024   GLUCAP 252 (H) 01/02/2024   GLUCAP 200 (H) 01/02/2024     Exercise Target Goals: Exercise Program  Goal: Individual exercise prescription set using results from initial 6 min walk test and THRR while considering  patient's activity barriers and safety.   Exercise Prescription Goal: Initial exercise prescription builds to 30-45 minutes a day of aerobic activity, 2-3 days per week.  Home exercise guidelines will be given to patient during program as part of exercise prescription that the participant will acknowledge.  Activity Barriers & Risk Stratification:  Activity Barriers & Cardiac Risk Stratification - 02/29/24 0849       Activity Barriers & Cardiac Risk Stratification   Activity Barriers Deconditioning;Decreased Ventricular Function;Balance Concerns    Cardiac Risk Stratification High          6 Minute Walk:  6 Minute Walk     Row Name 02/29/24 1136         6 Minute Walk   Phase Initial     Distance 1340 feet     Walk Time 6 minutes     # of Rest Breaks 0     MPH 2.54     METS 3.3     RPE 11     Perceived Dyspnea  0     VO2 Peak 11.5     Symptoms No     Resting HR 83 bpm     Resting BP 122/78     Resting Oxygen Saturation  96 %     Exercise Oxygen Saturation  during 6 min walk 98 %     Max Ex. HR 104 bpm     Max Ex. BP 154/82     2 Minute Post BP 132/80        Oxygen Initial Assessment:   Oxygen Re-Evaluation:   Oxygen Discharge (Final Oxygen Re-Evaluation):   Initial Exercise Prescription:  Initial Exercise Prescription - 02/29/24 1300       Date of Initial Exercise RX and Referring Provider   Date 02/29/24    Referring Provider Dr. Stanly Leavens MD    Expected Discharge Date 04/10/24      NuStep   Level 2    SPM 75    Minutes 15    METs 2.1      Track   Laps 15    Minutes 15    METs 2.2      Prescription Details   Frequency (times per week) 3    Duration Progress to 30 minutes of continuous aerobic without signs/symptoms of physical distress  Intensity   THRR 40-80% of Max Heartrate 61-122    Ratings of Perceived  Exertion 11-13    Perceived Dyspnea 0-4      Progression   Progression Continue progressive overload as per policy without signs/symptoms or physical distress.      Resistance Training   Training Prescription Yes    Weight 3    Reps 10-15          Perform Capillary Blood Glucose checks as needed.  Exercise Prescription Changes:   Exercise Comments:   Exercise Goals and Review:   Exercise Goals     Row Name 02/29/24 0849             Exercise Goals   Increase Physical Activity Yes       Intervention Provide advice, education, support and counseling about physical activity/exercise needs.;Develop an individualized exercise prescription for aerobic and resistive training based on initial evaluation findings, risk stratification, comorbidities and participant's personal goals.       Expected Outcomes Short Term: Attend rehab on a regular basis to increase amount of physical activity.;Long Term: Add in home exercise to make exercise part of routine and to increase amount of physical activity.;Long Term: Exercising regularly at least 3-5 days a week.       Able to understand and use rate of perceived exertion (RPE) scale Yes       Intervention Provide education and explanation on how to use RPE scale       Expected Outcomes Short Term: Able to use RPE daily in rehab to express subjective intensity level;Long Term:  Able to use RPE to guide intensity level when exercising independently       Knowledge and understanding of Target Heart Rate Range (THRR) Yes       Intervention Provide education and explanation of THRR including how the numbers were predicted and where they are located for reference       Expected Outcomes Short Term: Able to state/look up THRR;Long Term: Able to use THRR to govern intensity when exercising independently;Short Term: Able to use daily as guideline for intensity in rehab       Understanding of Exercise Prescription Yes       Intervention Provide  education, explanation, and written materials on patient's individual exercise prescription       Expected Outcomes Short Term: Able to explain program exercise prescription;Long Term: Able to explain home exercise prescription to exercise independently          Exercise Goals Re-Evaluation :   Discharge Exercise Prescription (Final Exercise Prescription Changes):   Nutrition:  Target Goals: Understanding of nutrition guidelines, daily intake of sodium 1500mg , cholesterol 200mg , calories 30% from fat and 7% or less from saturated fats, daily to have 5 or more servings of fruits and vegetables.  Biometrics:  Pre Biometrics - 02/29/24 0830       Pre Biometrics   Waist Circumference 42 inches    Hip Circumference 40 inches    Waist to Hip Ratio 1.05 %    Triceps Skinfold 6 mm    % Body Fat 25 %    Grip Strength 36 kg    Flexibility 16.75 in    Single Leg Stand 10 seconds           Nutrition Therapy Plan and Nutrition Goals:   Nutrition Assessments:  MEDIFICTS Score Key: >=70 Need to make dietary changes  40-70 Heart Healthy Diet <= 40 Therapeutic Level Cholesterol Diet    Picture  Your Plate Scores: <59 Unhealthy dietary pattern with much room for improvement. 41-50 Dietary pattern unlikely to meet recommendations for good health and room for improvement. 51-60 More healthful dietary pattern, with some room for improvement.  >60 Healthy dietary pattern, although there may be some specific behaviors that could be improved.    Nutrition Goals Re-Evaluation:   Nutrition Goals Re-Evaluation:   Nutrition Goals Discharge (Final Nutrition Goals Re-Evaluation):   Psychosocial: Target Goals: Acknowledge presence or absence of significant depression and/or stress, maximize coping skills, provide positive support system. Participant is able to verbalize types and ability to use techniques and skills needed for reducing stress and depression.  Initial Review &  Psychosocial Screening:  Initial Psych Review & Screening - 02/29/24 0850       Initial Review   Current issues with None Identified      Family Dynamics   Good Support System? Yes   He has great family support and has no needs at this time     Barriers   Psychosocial barriers to participate in program There are no identifiable barriers or psychosocial needs.;The patient should benefit from training in stress management and relaxation.      Screening Interventions   Interventions Encouraged to exercise;To provide support and resources with identified psychosocial needs;Provide feedback about the scores to participant    Expected Outcomes Long Term Goal: Stressors or current issues are controlled or eliminated.;Short Term goal: Identification and review with participant of any Quality of Life or Depression concerns found by scoring the questionnaire.;Long Term goal: The participant improves quality of Life and PHQ9 Scores as seen by post scores and/or verbalization of changes          Quality of Life Scores:  Quality of Life - 02/29/24 1125       Quality of Life   Select Quality of Life      Quality of Life Scores   Health/Function Pre 24.37 %    Socioeconomic Pre 27.93 %    Psych/Spiritual Pre 26.57 %    Family Pre 25.13 %    GLOBAL Pre 25.68 %         Scores of 19 and below usually indicate a poorer quality of life in these areas.  A difference of  2-3 points is a clinically meaningful difference.  A difference of 2-3 points in the total score of the Quality of Life Index has been associated with significant improvement in overall quality of life, self-image, physical symptoms, and general health in studies assessing change in quality of life.  PHQ-9: Review Flowsheet       02/29/2024 04/13/2015 01/05/2015  Depression screen PHQ 2/9  Decreased Interest 0 0 0  Down, Depressed, Hopeless 0 0 0  PHQ - 2 Score 0 0 0  Altered sleeping 0 - -  Tired, decreased energy 1 - -   Change in appetite 0 - -  Feeling bad or failure about yourself  0 - -  Trouble concentrating 0 - -  Moving slowly or fidgety/restless 0 - -  Suicidal thoughts 0 - -  PHQ-9 Score 1 - -  Difficult doing work/chores Not difficult at all - -   Interpretation of Total Score  Total Score Depression Severity:  1-4 = Minimal depression, 5-9 = Mild depression, 10-14 = Moderate depression, 15-19 = Moderately severe depression, 20-27 = Severe depression   Psychosocial Evaluation and Intervention:   Psychosocial Re-Evaluation:   Psychosocial Discharge (Final Psychosocial Re-Evaluation):   Vocational Rehabilitation: Provide  vocational rehab assistance to qualifying candidates.   Vocational Rehab Evaluation & Intervention:  Vocational Rehab - 02/29/24 0851       Initial Vocational Rehab Evaluation & Intervention   Assessment shows need for Vocational Rehabilitation No   Patient is retired         Education: Education Goals: Education classes will be provided on a weekly basis, covering required topics. Participant will state understanding/return demonstration of topics presented.     Core Videos: Exercise    Move It!  Clinical staff conducted group or individual video education with verbal and written material and guidebook.  Patient learns the recommended Pritikin exercise program. Exercise with the goal of living a long, healthy life. Some of the health benefits of exercise include controlled diabetes, healthier blood pressure levels, improved cholesterol levels, improved heart and lung capacity, improved sleep, and better body composition. Everyone should speak with their doctor before starting or changing an exercise routine.  Biomechanical Limitations Clinical staff conducted group or individual video education with verbal and written material and guidebook.  Patient learns how biomechanical limitations can impact exercise and how we can mitigate and possibly overcome  limitations to have an impactful and balanced exercise routine.  Body Composition Clinical staff conducted group or individual video education with verbal and written material and guidebook.  Patient learns that body composition (ratio of muscle mass to fat mass) is a key component to assessing overall fitness, rather than body weight alone. Increased fat mass, especially visceral belly fat, can put us  at increased risk for metabolic syndrome, type 2 diabetes, heart disease, and even death. It is recommended to combine diet and exercise (cardiovascular and resistance training) to improve your body composition. Seek guidance from your physician and exercise physiologist before implementing an exercise routine.  Exercise Action Plan Clinical staff conducted group or individual video education with verbal and written material and guidebook.  Patient learns the recommended strategies to achieve and enjoy long-term exercise adherence, including variety, self-motivation, self-efficacy, and positive decision making. Benefits of exercise include fitness, good health, weight management, more energy, better sleep, less stress, and overall well-being.  Medical   Heart Disease Risk Reduction Clinical staff conducted group or individual video education with verbal and written material and guidebook.  Patient learns our heart is our most vital organ as it circulates oxygen, nutrients, white blood cells, and hormones throughout the entire body, and carries waste away. Data supports a plant-based eating plan like the Pritikin Program for its effectiveness in slowing progression of and reversing heart disease. The video provides a number of recommendations to address heart disease.   Metabolic Syndrome and Belly Fat  Clinical staff conducted group or individual video education with verbal and written material and guidebook.  Patient learns what metabolic syndrome is, how it leads to heart disease, and how one can  reverse it and keep it from coming back. You have metabolic syndrome if you have 3 of the following 5 criteria: abdominal obesity, high blood pressure, high triglycerides, low HDL cholesterol, and high blood sugar.  Hypertension and Heart Disease Clinical staff conducted group or individual video education with verbal and written material and guidebook.  Patient learns that high blood pressure, or hypertension, is very common in the [place] . Hypertension is largely due to excessive salt intake, but other important risk factors include being overweight, physical inactivity, drinking too much alcohol, smoking, and not eating enough potassium from fruits and vegetables. High blood pressure is a leading risk factor for  heart attack, stroke, congestive heart failure, dementia, kidney failure, and premature death. Long-term effects of excessive salt intake include stiffening of the arteries and thickening of heart muscle and organ damage. Recommendations include ways to reduce hypertension and the risk of heart disease.  Diseases of Our Time - Focusing on Diabetes Clinical staff conducted group or individual video education with verbal and written material and guidebook.  Patient learns why the best way to stop diseases of our time is prevention, through food and other lifestyle changes. Medicine (such as prescription pills and surgeries) is often only a Band-Aid on the problem, not a long-term solution. Most common diseases of our time include obesity, type 2 diabetes, hypertension, heart disease, and cancer. The Pritikin Program is recommended and has been proven to help reduce, reverse, and/or prevent the damaging effects of metabolic syndrome.  Nutrition   Overview of the Pritikin Eating Plan  Clinical staff conducted group or individual video education with verbal and written material and guidebook.  Patient learns about the Pritikin Eating Plan for disease risk reduction. The Pritikin Eating Plan  emphasizes a wide variety of unrefined, minimally-processed carbohydrates, like fruits, vegetables, whole grains, and legumes. Go, Caution, and Stop food choices are explained. Plant-based and lean animal proteins are emphasized. Rationale provided for low sodium intake for blood pressure control, low added sugars for blood sugar stabilization, and low added fats and oils for coronary artery disease risk reduction and weight management.  Calorie Density  Clinical staff conducted group or individual video education with verbal and written material and guidebook.  Patient learns about calorie density and how it impacts the Pritikin Eating Plan. Knowing the characteristics of the food you choose will help you decide whether those foods will lead to weight gain or weight loss, and whether you want to consume more or less of them. Weight loss is usually a side effect of the Pritikin Eating Plan because of its focus on low calorie-dense foods.  Label Reading  Clinical staff conducted group or individual video education with verbal and written material and guidebook.  Patient learns about the Pritikin recommended label reading guidelines and corresponding recommendations regarding calorie density, added sugars, sodium content, and whole grains.  Dining Out - Part 1  Clinical staff conducted group or individual video education with verbal and written material and guidebook.  Patient learns that restaurant meals can be sabotaging because they can be so high in calories, fat, sodium, and/or sugar. Patient learns recommended strategies on how to positively address this and avoid unhealthy pitfalls.  Facts on Fats  Clinical staff conducted group or individual video education with verbal and written material and guidebook.  Patient learns that lifestyle modifications can be just as effective, if not more so, as many medications for lowering your risk of heart disease. A Pritikin lifestyle can help to reduce your  risk of inflammation and atherosclerosis (cholesterol build-up, or plaque, in the artery walls). Lifestyle interventions such as dietary choices and physical activity address the cause of atherosclerosis. A review of the types of fats and their impact on blood cholesterol levels, along with dietary recommendations to reduce fat intake is also included.  Nutrition Action Plan  Clinical staff conducted group or individual video education with verbal and written material and guidebook.  Patient learns how to incorporate Pritikin recommendations into their lifestyle. Recommendations include planning and keeping personal health goals in mind as an important part of their success.  Healthy Mind-Set    Healthy Minds, Bodies, Hearts  Clinical staff conducted group or individual video education with verbal and written material and guidebook.  Patient learns how to identify when they are stressed. Video will discuss the impact of that stress, as well as the many benefits of stress management. Patient will also be introduced to stress management techniques. The way we think, act, and feel has an impact on our hearts.  How Our Thoughts Can Heal Our Hearts  Clinical staff conducted group or individual video education with verbal and written material and guidebook.  Patient learns that negative thoughts can cause depression and anxiety. This can result in negative lifestyle behavior and serious health problems. Cognitive behavioral therapy is an effective method to help control our thoughts in order to change and improve our emotional outlook.  Additional Videos:  Exercise    Improving Performance  Clinical staff conducted group or individual video education with verbal and written material and guidebook.  Patient learns to use a non-linear approach by alternating intensity levels and lengths of time spent exercising to help burn more calories and lose more body fat. Cardiovascular exercise helps improve heart  health, metabolism, hormonal balance, blood sugar control, and recovery from fatigue. Resistance training improves strength, endurance, balance, coordination, reaction time, metabolism, and muscle mass. Flexibility exercise improves circulation, posture, and balance. Seek guidance from your physician and exercise physiologist before implementing an exercise routine and learn your capabilities and proper form for all exercise.  Introduction to Yoga  Clinical staff conducted group or individual video education with verbal and written material and guidebook.  Patient learns about yoga, a discipline of the coming together of mind, breath, and body. The benefits of yoga include improved flexibility, improved range of motion, better posture and core strength, increased lung function, weight loss, and positive self-image. Yoga's heart health benefits include lowered blood pressure, healthier heart rate, decreased cholesterol and triglyceride levels, improved immune function, and reduced stress. Seek guidance from your physician and exercise physiologist before implementing an exercise routine and learn your capabilities and proper form for all exercise.  Medical   Aging: Enhancing Your Quality of Life  Clinical staff conducted group or individual video education with verbal and written material and guidebook.  Patient learns key strategies and recommendations to stay in good physical health and enhance quality of life, such as prevention strategies, having an advocate, securing a Health Care Proxy and Power of Attorney, and keeping a list of medications and system for tracking them. It also discusses how to avoid risk for bone loss.  Biology of Weight Control  Clinical staff conducted group or individual video education with verbal and written material and guidebook.  Patient learns that weight gain occurs because we consume more calories than we burn (eating more, moving less). Even if your body weight is  normal, you may have higher ratios of fat compared to muscle mass. Too much body fat puts you at increased risk for cardiovascular disease, heart attack, stroke, type 2 diabetes, and obesity-related cancers. In addition to exercise, following the Pritikin Eating Plan can help reduce your risk.  Decoding Lab Results  Clinical staff conducted group or individual video education with verbal and written material and guidebook.  Patient learns that lab test reflects one measurement whose values change over time and are influenced by many factors, including medication, stress, sleep, exercise, food, hydration, pre-existing medical conditions, and more. It is recommended to use the knowledge from this video to become more involved with your lab results and evaluate your numbers to  speak with your doctor.   Diseases of Our Time - Overview  Clinical staff conducted group or individual video education with verbal and written material and guidebook.  Patient learns that according to the CDC, 50% to 70% of chronic diseases (such as obesity, type 2 diabetes, elevated lipids, hypertension, and heart disease) are avoidable through lifestyle improvements including healthier food choices, listening to satiety cues, and increased physical activity.  Sleep Disorders Clinical staff conducted group or individual video education with verbal and written material and guidebook.  Patient learns how good quality and duration of sleep are important to overall health and well-being. Patient also learns about sleep disorders and how they impact health along with recommendations to address them, including discussing with a physician.  Nutrition  Dining Out - Part 2 Clinical staff conducted group or individual video education with verbal and written material and guidebook.  Patient learns how to plan ahead and communicate in order to maximize their dining experience in a healthy and nutritious manner. Included are recommended  food choices based on the type of restaurant the patient is visiting.   Fueling a Banker conducted group or individual video education with verbal and written material and guidebook.  There is a strong connection between our food choices and our health. Diseases like obesity and type 2 diabetes are very prevalent and are in large-part due to lifestyle choices. The Pritikin Eating Plan provides plenty of food and hunger-curbing satisfaction. It is easy to follow, affordable, and helps reduce health risks.  Menu Workshop  Clinical staff conducted group or individual video education with verbal and written material and guidebook.  Patient learns that restaurant meals can sabotage health goals because they are often packed with calories, fat, sodium, and sugar. Recommendations include strategies to plan ahead and to communicate with the manager, chef, or server to help order a healthier meal.  Planning Your Eating Strategy  Clinical staff conducted group or individual video education with verbal and written material and guidebook.  Patient learns about the Pritikin Eating Plan and its benefit of reducing the risk of disease. The Pritikin Eating Plan does not focus on calories. Instead, it emphasizes high-quality, nutrient-rich foods. By knowing the characteristics of the foods, we choose, we can determine their calorie density and make informed decisions.  Targeting Your Nutrition Priorities  Clinical staff conducted group or individual video education with verbal and written material and guidebook.  Patient learns that lifestyle habits have a tremendous impact on disease risk and progression. This video provides eating and physical activity recommendations based on your personal health goals, such as reducing LDL cholesterol, losing weight, preventing or controlling type 2 diabetes, and reducing high blood pressure.  Vitamins and Minerals  Clinical staff conducted group or  individual video education with verbal and written material and guidebook.  Patient learns different ways to obtain key vitamins and minerals, including through a recommended healthy diet. It is important to discuss all supplements you take with your doctor.   Healthy Mind-Set    Smoking Cessation  Clinical staff conducted group or individual video education with verbal and written material and guidebook.  Patient learns that cigarette smoking and tobacco addiction pose a serious health risk which affects millions of people. Stopping smoking will significantly reduce the risk of heart disease, lung disease, and many forms of cancer. Recommended strategies for quitting are covered, including working with your doctor to develop a successful plan.  Culinary   Becoming a Neurosurgeon  Clinical staff conducted group or individual video education with verbal and written material and guidebook.  Patient learns that cooking at home can be healthy, cost-effective, quick, and puts them in control. Keys to cooking healthy recipes will include looking at your recipe, assessing your equipment needs, planning ahead, making it simple, choosing cost-effective seasonal ingredients, and limiting the use of added fats, salts, and sugars.  Cooking - Breakfast and Snacks  Clinical staff conducted group or individual video education with verbal and written material and guidebook.  Patient learns how important breakfast is to satiety and nutrition through the entire day. Recommendations include key foods to eat during breakfast to help stabilize blood sugar levels and to prevent overeating at meals later in the day. Planning ahead is also a key component.  Cooking - Educational psychologist conducted group or individual video education with verbal and written material and guidebook.  Patient learns eating strategies to improve overall health, including an approach to cook more at home. Recommendations include  thinking of animal protein as a side on your plate rather than center stage and focusing instead on lower calorie dense options like vegetables, fruits, whole grains, and plant-based proteins, such as beans. Making sauces in large quantities to freeze for later and leaving the skin on your vegetables are also recommended to maximize your experience.  Cooking - Healthy Salads and Dressing Clinical staff conducted group or individual video education with verbal and written material and guidebook.  Patient learns that vegetables, fruits, whole grains, and legumes are the foundations of the Pritikin Eating Plan. Recommendations include how to incorporate each of these in flavorful and healthy salads, and how to create homemade salad dressings. Proper handling of ingredients is also covered. Cooking - Soups and [employer] - Soups and Desserts Clinical staff conducted group or individual video education with verbal and written material and guidebook.  Patient learns that Pritikin soups and desserts make for easy, nutritious, and delicious snacks and meal components that are low in sodium, fat, sugar, and calorie density, while high in vitamins, minerals, and filling fiber. Recommendations include simple and healthy ideas for soups and desserts.   Overview     The Pritikin Solution Program Overview Clinical staff conducted group or individual video education with verbal and written material and guidebook.  Patient learns that the results of the Pritikin Program have been documented in more than 100 articles published in peer-reviewed journals, and the benefits include reducing risk factors for (and, in some cases, even reversing) high cholesterol, high blood pressure, type 2 diabetes, obesity, and more! An overview of the three key pillars of the Pritikin Program will be covered: eating well, doing regular exercise, and having a healthy mind-set.  WORKSHOPS  Exercise: Exercise Basics: Building Your  Action Plan Clinical staff led group instruction and group discussion with PowerPoint presentation and patient guidebook. To enhance the learning environment the use of posters, models and videos may be added. At the conclusion of this workshop, patients will comprehend the difference between physical activity and exercise, as well as the benefits of incorporating both, into their routine. Patients will understand the FITT (Frequency, Intensity, Time, and Type) principle and how to use it to build an exercise action plan. In addition, safety concerns and other considerations for exercise and cardiac rehab will be addressed by the presenter. The purpose of this lesson is to promote a comprehensive and effective weekly exercise routine in order to improve patients' overall level of fitness.  Managing Heart Disease: Your Path to a Healthier Heart Clinical staff led group instruction and group discussion with PowerPoint presentation and patient guidebook. To enhance the learning environment the use of posters, models and videos may be added.At the conclusion of this workshop, patients will understand the anatomy and physiology of the heart. Additionally, they will understand how Pritikin's three pillars impact the risk factors, the progression, and the management of heart disease.  The purpose of this lesson is to provide a high-level overview of the heart, heart disease, and how the Pritikin lifestyle positively impacts risk factors.  Exercise Biomechanics Clinical staff led group instruction and group discussion with PowerPoint presentation and patient guidebook. To enhance the learning environment the use of posters, models and videos may be added. Patients will learn how the structural parts of their bodies function and how these functions impact their daily activities, movement, and exercise. Patients will learn how to promote a neutral spine, learn how to manage pain, and identify ways to  improve their physical movement in order to promote healthy living. The purpose of this lesson is to expose patients to common physical limitations that impact physical activity. Participants will learn practical ways to adapt and manage aches and pains, and to minimize their effect on regular exercise. Patients will learn how to maintain good posture while sitting, walking, and lifting.  Balance Training and Fall Prevention  Clinical staff led group instruction and group discussion with PowerPoint presentation and patient guidebook. To enhance the learning environment the use of posters, models and videos may be added. At the conclusion of this workshop, patients will understand the importance of their sensorimotor skills (vision, proprioception, and the vestibular system) in maintaining their ability to balance as they age. Patients will apply a variety of balancing exercises that are appropriate for their current level of function. Patients will understand the common causes for poor balance, possible solutions to these problems, and ways to modify their physical environment in order to minimize their fall risk. The purpose of this lesson is to teach patients about the importance of maintaining balance as they age and ways to minimize their risk of falling.  WORKSHOPS   Nutrition:  Fueling a Ship broker led group instruction and group discussion with PowerPoint presentation and patient guidebook. To enhance the learning environment the use of posters, models and videos may be added. Patients will review the foundational principles of the Pritikin Eating Plan and understand what constitutes a serving size in each of the food groups. Patients will also learn Pritikin-friendly foods that are better choices when away from home and review make-ahead meal and snack options. Calorie density will be reviewed and applied to three nutrition priorities: weight maintenance, weight loss, and  weight gain. The purpose of this lesson is to reinforce (in a group setting) the key concepts around what patients are recommended to eat and how to apply these guidelines when away from home by planning and selecting Pritikin-friendly options. Patients will understand how calorie density may be adjusted for different weight management goals.  Mindful Eating  Clinical staff led group instruction and group discussion with PowerPoint presentation and patient guidebook. To enhance the learning environment the use of posters, models and videos may be added. Patients will briefly review the concepts of the Pritikin Eating Plan and the importance of low-calorie dense foods. The concept of mindful eating will be introduced as well as the importance of paying attention to internal hunger signals. Triggers for non-hunger eating and  techniques for dealing with triggers will be explored. The purpose of this lesson is to provide patients with the opportunity to review the basic principles of the Pritikin Eating Plan, discuss the value of eating mindfully and how to measure internal cues of hunger and fullness using the Hunger Scale. Patients will also discuss reasons for non-hunger eating and learn strategies to use for controlling emotional eating.  Targeting Your Nutrition Priorities Clinical staff led group instruction and group discussion with PowerPoint presentation and patient guidebook. To enhance the learning environment the use of posters, models and videos may be added. Patients will learn how to determine their genetic susceptibility to disease by reviewing their family history. Patients will gain insight into the importance of diet as part of an overall healthy lifestyle in mitigating the impact of genetics and other environmental insults. The purpose of this lesson is to provide patients with the opportunity to assess their personal nutrition priorities by looking at their family history, their own health  history and current risk factors. Patients will also be able to discuss ways of prioritizing and modifying the Pritikin Eating Plan for their highest risk areas  Menu  Clinical staff led group instruction and group discussion with PowerPoint presentation and patient guidebook. To enhance the learning environment the use of posters, models and videos may be added. Using menus brought in from [employer], or printed from [employer], patients will apply the Pritikin dining out guidelines that were presented in the [employer] video. Patients will also be able to practice these guidelines in a variety of provided scenarios. The purpose of this lesson is to provide patients with the opportunity to practice hands-on learning of the Pritikin Dining Out guidelines with actual menus and practice scenarios.  Label Reading Clinical staff led group instruction and group discussion with PowerPoint presentation and patient guidebook. To enhance the learning environment the use of posters, models and videos may be added. Patients will review and discuss the Pritikin label reading guidelines presented in Pritikin's Label Reading Educational series video. Using fool labels brought in from local grocery stores and markets, patients will apply the label reading guidelines and determine if the packaged food meet the Pritikin guidelines. The purpose of this lesson is to provide patients with the opportunity to review, discuss, and practice hands-on learning of the Pritikin Label Reading guidelines with actual packaged food labels. Cooking School  Pritikin's [employer] are designed to teach patients ways to prepare quick, simple, and affordable recipes at home. The importance of nutrition's role in chronic disease risk reduction is reflected in its emphasis in the overall Pritikin program. By learning how to prepare essential core Pritikin Eating Plan recipes, patients will increase  control over what they eat; be able to customize the flavor of foods without the use of added salt, sugar, or fat; and improve the quality of the food they consume. By learning a set of core recipes which are easily assembled, quickly prepared, and affordable, patients are more likely to prepare more healthy foods at home. These workshops focus on convenient breakfasts, simple entres, side dishes, and desserts which can be prepared with minimal effort and are consistent with nutrition recommendations for cardiovascular risk reduction. Cooking [employer] are taught by a Armed forces logistics/support/administrative officer (RD) who has been trained by the [employer]. The chef or RD has a clear understanding of the importance of minimizing - if not completely eliminating - added fat, sugar, and sodium in  recipes. Throughout the series of Cooking School Workshop sessions, patients will learn about healthy ingredients and efficient methods of cooking to build confidence in their capability to prepare    Cooking School weekly topics:  Adding Flavor- Sodium-Free  Fast and Healthy Breakfasts  Powerhouse Plant-Based Proteins  Satisfying Salads and Dressings  Simple Sides and Sauces  International Cuisine-Spotlight on the [employer] Zones  Delicious Desserts  Savory Soups  [employer] - Meals in a Astronomer Appetizers and Snacks  Comforting Weekend Breakfasts  One-Pot Wonders   Fast Evening Meals  Landscape architect Your Pritikin Plate  WORKSHOPS   Healthy Mindset (Psychosocial):  Focused Goals, Sustainable Changes Clinical staff led group instruction and group discussion with PowerPoint presentation and patient guidebook. To enhance the learning environment the use of posters, models and videos may be added. Patients will be able to apply effective goal setting strategies to establish at least one personal goal, and then take consistent, meaningful action toward that goal. They will  learn to identify common barriers to achieving personal goals and develop strategies to overcome them. Patients will also gain an understanding of how our mind-set can impact our ability to achieve goals and the importance of cultivating a positive and growth-oriented mind-set. The purpose of this lesson is to provide patients with a deeper understanding of how to set and achieve personal goals, as well as the tools and strategies needed to overcome common obstacles which may arise along the way.  From Head to Heart: The Power of a Healthy Outlook  Clinical staff led group instruction and group discussion with PowerPoint presentation and patient guidebook. To enhance the learning environment the use of posters, models and videos may be added. Patients will be able to recognize and describe the impact of emotions and mood on physical health. They will discover the importance of self-care and explore self-care practices which may work for them. Patients will also learn how to utilize the 4 C's to cultivate a healthier outlook and better manage stress and challenges. The purpose of this lesson is to demonstrate to patients how a healthy outlook is an essential part of maintaining good health, especially as they continue their cardiac rehab journey.  Healthy Sleep for a Healthy Heart Clinical staff led group instruction and group discussion with PowerPoint presentation and patient guidebook. To enhance the learning environment the use of posters, models and videos may be added. At the conclusion of this workshop, patients will be able to demonstrate knowledge of the importance of sleep to overall health, well-being, and quality of life. They will understand the symptoms of, and treatments for, common sleep disorders. Patients will also be able to identify daytime and nighttime behaviors which impact sleep, and they will be able to apply these tools to help manage sleep-related challenges. The purpose of this  lesson is to provide patients with a general overview of sleep and outline the importance of quality sleep. Patients will learn about a few of the most common sleep disorders. Patients will also be introduced to the concept of "sleep hygiene," and discover ways to self-manage certain sleeping problems through simple daily behavior changes. Finally, the workshop will motivate patients by clarifying the links between quality sleep and their goals of heart-healthy living.   Recognizing and Reducing Stress Clinical staff led group instruction and group discussion with PowerPoint presentation and patient guidebook. To enhance the learning environment the use of posters, models and videos may be added. At the conclusion of this  workshop, patients will be able to understand the types of stress reactions, differentiate between acute and chronic stress, and recognize the impact that chronic stress has on their health. They will also be able to apply different coping mechanisms, such as reframing negative self-talk. Patients will have the opportunity to practice a variety of stress management techniques, such as deep abdominal breathing, progressive muscle relaxation, and/or guided imagery.  The purpose of this lesson is to educate patients on the role of stress in their lives and to provide healthy techniques for coping with it.  Learning Barriers/Preferences:  Learning Barriers/Preferences - 02/29/24 0851       Learning Barriers/Preferences   Learning Barriers Sight;Exercise Concerns   Right eye almost blind   Learning Preferences Group Instruction;Individual Instruction;Skilled Demonstration;Written Material;Pictoral          Education Topics:  Knowledge Questionnaire Score:  Knowledge Questionnaire Score - 02/29/24 1123       Knowledge Questionnaire Score   Pre Score 22/24          Core Components/Risk Factors/Patient Goals at Admission:  Personal Goals and Risk Factors at Admission -  02/29/24 0852       Core Components/Risk Factors/Patient Goals on Admission   Diabetes Yes    Intervention Provide education about signs/symptoms and action to take for hypo/hyperglycemia.;Provide education about proper nutrition, including hydration, and aerobic/resistive exercise prescription along with prescribed medications to achieve blood glucose in normal ranges: Fasting glucose 65-99 mg/dL    Expected Outcomes Short Term: Participant verbalizes understanding of the signs/symptoms and immediate care of hyper/hypoglycemia, proper foot care and importance of medication, aerobic/resistive exercise and nutrition plan for blood glucose control.;Long Term: Attainment of HbA1C < 7%.    Heart Failure Yes    Intervention Provide a combined exercise and nutrition program that is supplemented with education, support and counseling about heart failure. Directed toward relieving symptoms such as shortness of breath, decreased exercise tolerance, and extremity edema.    Expected Outcomes Improve functional capacity of life;Short term: Attendance in program 2-3 days a week with increased exercise capacity. Reported lower sodium intake. Reported increased fruit and vegetable intake. Reports medication compliance.;Short term: Daily weights obtained and reported for increase. Utilizing diuretic protocols set by physician.;Long term: Adoption of self-care skills and reduction of barriers for early signs and symptoms recognition and intervention leading to self-care maintenance.    Hypertension Yes    Intervention Provide education on lifestyle modifcations including regular physical activity/exercise, weight management, moderate sodium restriction and increased consumption of fresh fruit, vegetables, and low fat dairy, alcohol moderation, and smoking cessation.;Monitor prescription use compliance.    Expected Outcomes Short Term: Continued assessment and intervention until BP is < 140/48mm HG in hypertensive  participants. < 130/18mm HG in hypertensive participants with diabetes, heart failure or chronic kidney disease.;Long Term: Maintenance of blood pressure at goal levels.    Lipids Yes    Intervention Provide education and support for participant on nutrition & aerobic/resistive exercise along with prescribed medications to achieve LDL 70mg , HDL >40mg .    Expected Outcomes Short Term: Participant states understanding of desired cholesterol values and is compliant with medications prescribed. Participant is following exercise prescription and nutrition guidelines.;Long Term: Cholesterol controlled with medications as prescribed, with individualized exercise RX and with personalized nutrition plan. Value goals: LDL < 70mg , HDL > 40 mg.    Stress Yes    Intervention Offer individual and/or small group education and counseling on adjustment to heart disease, stress management and health-related lifestyle change.  Teach and support self-help strategies.;Refer participants experiencing significant psychosocial distress to appropriate mental health specialists for further evaluation and treatment. When possible, include family members and significant others in education/counseling sessions.    Expected Outcomes Short Term: Participant demonstrates changes in health-related behavior, relaxation and other stress management skills, ability to obtain effective social support, and compliance with psychotropic medications if prescribed.;Long Term: Emotional wellbeing is indicated by absence of clinically significant psychosocial distress or social isolation.    Personal Goal Other Yes    Personal Goal ST, get into Ex routine, heart healthy eating, LT balnce    Intervention Will continue to monitor pt    Expected Outcomes Pt will achieve his goals          Core Components/Risk Factors/Patient Goals Review:    Core Components/Risk Factors/Patient Goals at Discharge (Final Review):    ITP Comments:  ITP  Comments     Row Name 02/29/24 0755           ITP Comments Dr. Wilbert Bihari medical director. Introduction to [facility] education/intensive cardiac rehab. Initial orientation packet reviewed with patient.          Comments: Participant attended orientation for the cardiac rehabilitation program on  02/29/2024  to perform initial intake and exercise walk test. Patient introduced to the [facility] education and orientation packet was reviewed. Completed 6-minute walk test, measurements, initial ITP, and exercise prescription. Vital signs stable. Telemetry-normal sinus rhythm, RBBB, asymptomatic.   Service time was from 0754 to 1015.

## 2024-02-29 NOTE — Progress Notes (Signed)
 Cardiac Rehab Medication Review   Does the patient  feel that his/her medications are working for him/her?  yes  Has the patient been experiencing any side effects to the medications prescribed?  no  Does the patient measure his/her own blood pressure or blood glucose at home?  yes   Does the patient have any problems obtaining medications due to transportation or finances?   no  Understanding of regimen: excellent Understanding of indications: excellent Potential of compliance: excellent    Comments: Patient is taking all medications as prescribed. He has a BP cuff at home and a CBG monitor. He is in good control of his glucose at this time and is working very hard on dietary changes     Alec GORMAN Finder 02/29/2024 9:14 AM

## 2024-03-04 ENCOUNTER — Encounter (HOSPITAL_COMMUNITY)
Admission: RE | Admit: 2024-03-04 | Discharge: 2024-03-04 | Disposition: A | Discharge status: Home / Self Care | Payer: Medicare (Managed Care) | Source: Ambulatory Visit | Attending: Internal Medicine | Admitting: Internal Medicine

## 2024-03-04 DIAGNOSIS — Z955 Presence of coronary angioplasty implant and graft: Secondary | ICD-10-CM

## 2024-03-04 DIAGNOSIS — I214 Non-ST elevation (NSTEMI) myocardial infarction: Secondary | ICD-10-CM

## 2024-03-04 LAB — GLUCOSE, CAPILLARY
Glucose-Capillary: 144 mg/dL — ABNORMAL HIGH (ref 70–99)
Glucose-Capillary: 88 mg/dL (ref 70–99)

## 2024-03-04 NOTE — Progress Notes (Signed)
 Daily Session Note  Patient Details  Name: Hayden Williams MRN: [id number] Date of Birth: 10-07-1955 Referring Provider:   Flowsheet Row CARDIAC REHAB PHASE II ORIENTATION from 02/29/2024 in [facility]  Referring Provider Dr. Stanly Leavens MD    Encounter Date: 03/04/2024  Check In:  Session Check In - 03/04/24 0704       Check-In   Supervising physician immediately available to respond to emergencies [facility] MD immediately available    Physician(s) Lum Louis, NP    Location [facility]    Staff Present Alec Finder BS, ACSM-CEP, Exercise Physiologist;Kaylee Nicholaus, MS, ACSM-CEP, Exercise Physiologist;Desarea Ohagan Lennon, RN, BSN;Johnny Porter, MS, Exercise Physiologist    Virtual Visit No    Medication changes reported     No    Fall or balance concerns reported    No    Tobacco Cessation No Change    Warm-up and Cool-down Performed as group-led instruction    Resistance Training Performed Yes    VAD Patient? No    PAD/SET Patient? No      Pain Assessment   Currently in Pain? No/denies    Pain Score 0-No pain    Multiple Pain Sites No          Capillary Blood Glucose: Results for orders placed or performed during the hospital encounter of 03/04/24 (from the past 24 hours)  Glucose, capillary     Status: Abnormal   Collection Time: 03/04/24  7:03 AM  Result Value Ref Range   Glucose-Capillary 144 (H) 70 - 99 mg/dL  Glucose, capillary     Status: None   Collection Time: 03/04/24  8:03 AM  Result Value Ref Range   Glucose-Capillary 88 70 - 99 mg/dL     Exercise Prescription Changes - 03/04/24 0828       Response to Exercise   Blood Pressure (Admit) 118/78    Blood Pressure (Exercise) 126/74    Blood Pressure (Exit) 112/70    Heart Rate (Admit) 74 bpm    Heart Rate (Exercise) 92 bpm    Heart Rate (Exit) 82 bpm    Rating of Perceived Exertion (Exercise) 11    Perceived Dyspnea  (Exercise) 0    Symptoms none    Comments Pt first day in the [facility] ICR program    Duration Progress to 30 minutes of  aerobic without signs/symptoms of physical distress    Intensity THRR unchanged      Progression   Progression Continue to progress workloads to maintain intensity without signs/symptoms of physical distress.    Average METs 3.52      Resistance Training   Training Prescription Yes    Weight 3    Reps 10-15    Time 10 Minutes      NuStep   Level 2    SPM 89    Minutes 15    METs 2.4      Track   Laps 13    Minutes 15    METs 2.66          Social History   Tobacco Use  Smoking Status Never  Smokeless Tobacco Never    Goals Met:  Exercise tolerated well No report of concerns or symptoms today Strength training completed today  Goals Unmet:  Not Applicable  Comments: Pt started cardiac rehab today.  Pt tolerated light exercise without difficulty. VSS, telemetry-NSR, asymptomatic.  Medication list reconciled. Pt denies barriers to medication  compliance.  PSYCHOSOCIAL ASSESSMENT:  PHQ-1. Pt exhibits positive coping skills, hopeful outlook with supportive family. No psychosocial needs identified at this time, no psychosocial interventions necessary.    Pt enjoys traveling, his family and his dog.   Pt oriented to exercise equipment and routine.    Understanding verbalized.     Dr. Wilbert Bihari is Medical Director for Cardiac Rehab at [facility].

## 2024-03-06 ENCOUNTER — Encounter (HOSPITAL_COMMUNITY)
Admission: RE | Admit: 2024-03-06 | Discharge: 2024-03-06 | Disposition: A | Payer: Medicare (Managed Care) | Source: Ambulatory Visit | Attending: Internal Medicine

## 2024-03-06 DIAGNOSIS — I214 Non-ST elevation (NSTEMI) myocardial infarction: Secondary | ICD-10-CM | POA: Diagnosis not present

## 2024-03-06 DIAGNOSIS — Z955 Presence of coronary angioplasty implant and graft: Secondary | ICD-10-CM

## 2024-03-08 ENCOUNTER — Encounter (HOSPITAL_COMMUNITY)
Admission: RE | Admit: 2024-03-08 | Discharge: 2024-03-08 | Disposition: A | Discharge status: Home / Self Care | Payer: Medicare (Managed Care) | Source: Ambulatory Visit | Attending: Internal Medicine | Admitting: Internal Medicine

## 2024-03-08 DIAGNOSIS — I214 Non-ST elevation (NSTEMI) myocardial infarction: Secondary | ICD-10-CM

## 2024-03-08 DIAGNOSIS — Z955 Presence of coronary angioplasty implant and graft: Secondary | ICD-10-CM

## 2024-03-11 ENCOUNTER — Encounter (HOSPITAL_COMMUNITY)
Admission: RE | Admit: 2024-03-11 | Discharge: 2024-03-11 | Disposition: A | Discharge status: Home / Self Care | Payer: Medicare (Managed Care) | Source: Ambulatory Visit | Attending: Internal Medicine | Admitting: Internal Medicine

## 2024-03-11 DIAGNOSIS — Z955 Presence of coronary angioplasty implant and graft: Secondary | ICD-10-CM | POA: Diagnosis present

## 2024-03-11 DIAGNOSIS — Z48812 Encounter for surgical aftercare following surgery on the circulatory system: Secondary | ICD-10-CM | POA: Diagnosis not present

## 2024-03-11 DIAGNOSIS — I252 Old myocardial infarction: Secondary | ICD-10-CM | POA: Insufficient documentation

## 2024-03-11 DIAGNOSIS — I214 Non-ST elevation (NSTEMI) myocardial infarction: Secondary | ICD-10-CM

## 2024-03-11 NOTE — Progress Notes (Signed)
 Cardiac Individual Treatment Plan  Patient Details  Name: Hayden Williams MRN: [id number] Date of Birth: 06/22/1955 Referring Provider:   Flowsheet Row CARDIAC REHAB PHASE II ORIENTATION from 02/29/2024 in [facility]  Referring Provider Dr. Stanly Leavens MD    Initial Encounter Date:  Flowsheet Row CARDIAC REHAB PHASE II ORIENTATION from 02/29/2024 in [facility]  Date 02/29/24    Visit Diagnosis: 01/02/24 NSTEMI  01/03/24 DES RCA  Patient's Home Medications on Admission:  Current Outpatient Medications:    allopurinol  (ZYLOPRIM ) 100 MG tablet, Take 100 mg by mouth 2 (two) times daily., Disp: , Rfl:    amLODipine  (NORVASC ) 10 MG tablet, Take 10 mg by mouth every evening., Disp: , Rfl: 3   aspirin  EC 81 MG tablet, Take 1 tablet (81 mg total) by mouth daily. To start in 4 weeks once Plavix  and Brillinta course completed, Disp: , Rfl:    atorvastatin  (LIPITOR ) 80 MG tablet, TAKE 1 TABLET BY MOUTH DAILY, Disp: 15 tablet, Rfl: 0   Cholecalciferol (VITAMIN D-3 PO), Take 1 capsule by mouth daily., Disp: , Rfl:    ezetimibe  (ZETIA ) 10 MG tablet, Take 1 tablet (10 mg total) by mouth daily., Disp: 30 tablet, Rfl: 1   insulin  aspart (NOVOLOG  FLEXPEN) 100 UNIT/ML FlexPen, Inject 3-10 Units into the skin See admin instructions. Inject 3-10 units three times daily before each meal, per sliding scale: Under 100: 3 units 100 - 150 : 5 units 151 - 200 : 7 units 201 - 250 : 8 units 251 - 300 : 9 units Over 300 : 10 units, Disp: , Rfl:    insulin  glargine, 1 Unit Dial , (TOUJEO ) 300 UNIT/ML Solostar Pen, Inject 44 Units into the skin daily., Disp: , Rfl:    IRON-VITAMIN C  PO, Take 1 tablet by mouth daily., Disp: , Rfl:    isosorbide  mononitrate (IMDUR ) 30 MG 24 hr tablet, Take 1 tablet (30 mg total) by mouth daily., Disp: 90 tablet, Rfl: 3   metoprolol  tartrate (LOPRESSOR ) 100 MG  tablet, TAKE 1 TABLET BY MOUTH 2 TIMES A DAY, Disp: 60 tablet, Rfl: 0   MOUNJARO 10 MG/0.5ML Pen, Inject 10 mg into the skin once a week., Disp: , Rfl:    nitroGLYCERIN  (NITROSTAT ) 0.4 MG SL tablet, Place 1 tablet (0.4 mg total) under the tongue every 5 (five) minutes as needed for chest pain., Disp: 25 tablet, Rfl: 4   pantoprazole  (PROTONIX ) 40 MG tablet, Take 1 tablet (40 mg total) by mouth daily., Disp: 90 tablet, Rfl: 3   tamsulosin  (FLOMAX ) 0.4 MG CAPS capsule, Take 0.4 mg by mouth daily., Disp: , Rfl:    ticagrelor  (BRILINTA ) 90 MG TABS tablet, Take 1 tablet (90 mg total) by mouth 2 (two) times daily., Disp: 60 tablet, Rfl: 11  Past Medical History: Past Medical History:  Diagnosis Date   CAD (coronary artery disease)    a. 11/2014 Cath/PCI: LM nl, LAD 50p, 60d, RI 40, LCX small, OM1 40, OM2 40, RCA 95d (2.25x20 Promus Premier DES), EF nl.   CKD (chronic kidney disease), stage III (HCC)    Diabetes mellitus (HCC)    Esophageal ulcer    Essential hypertension    GI bleed 06/2018   a. melena/ABL anemia with EGD on 06/27/18 with healed esophageal ulcer, esophagitis, hiatal hernia.   Hyperlipidemia    Sleep apnea     Tobacco Use: Social History  Tobacco Use  Smoking Status Never  Smokeless Tobacco Never    Labs: Review Flowsheet  More data may exist      Latest Ref Rng & Units 09/11/2015 06/26/2018 10/25/2023 10/27/2023 01/02/2024  Labs for ITP Cardiac and Pulmonary Rehab  Cholestrol 0 - 200 mg/dL 846  98  - 893  71   LDL (calc) 0 - 99 mg/dL 92  46  - 53  22   HDL-C >40 mg/dL 35  27  - 27  27   Trlycerides <150 mg/dL 871  876  - 871  889   Hemoglobin A1c 4.8 - 5.6 % - 7.2  - 7.7  7.3   TCO2 22 - 32 mmol/L - - 22  - -    Capillary Blood Glucose: Lab Results  Component Value Date   GLUCAP 88 03/04/2024   GLUCAP 144 (H) 03/04/2024   GLUCAP 103 (H) 01/04/2024   GLUCAP 241 (H) 01/03/2024   GLUCAP 135 (H) 01/03/2024     Exercise Target Goals: Exercise Program  Goal: Individual exercise prescription set using results from initial 6 min walk test and THRR while considering  patient's activity barriers and safety.   Exercise Prescription Goal: Initial exercise prescription builds to 30-45 minutes a day of aerobic activity, 2-3 days per week.  Home exercise guidelines will be given to patient during program as part of exercise prescription that the participant will acknowledge.  Activity Barriers & Risk Stratification:  Activity Barriers & Cardiac Risk Stratification - 02/29/24 0849       Activity Barriers & Cardiac Risk Stratification   Activity Barriers Deconditioning;Decreased Ventricular Function;Balance Concerns    Cardiac Risk Stratification High          6 Minute Walk:  6 Minute Walk     Row Name 02/29/24 1136         6 Minute Walk   Phase Initial     Distance 1340 feet     Walk Time 6 minutes     # of Rest Breaks 0     MPH 2.54     METS 3.3     RPE 11     Perceived Dyspnea  0     VO2 Peak 11.5     Symptoms No     Resting HR 83 bpm     Resting BP 122/78     Resting Oxygen Saturation  96 %     Exercise Oxygen Saturation  during 6 min walk 98 %     Max Ex. HR 104 bpm     Max Ex. BP 154/82     2 Minute Post BP 132/80        Oxygen Initial Assessment:   Oxygen Re-Evaluation:   Oxygen Discharge (Final Oxygen Re-Evaluation):   Initial Exercise Prescription:  Initial Exercise Prescription - 02/29/24 1300       Date of Initial Exercise RX and Referring Provider   Date 02/29/24    Referring Provider Dr. Stanly Leavens MD    Expected Discharge Date 04/10/24      NuStep   Level 2    SPM 75    Minutes 15    METs 2.1      Track   Laps 15    Minutes 15    METs 2.2      Prescription Details   Frequency (times per week) 3    Duration Progress to 30 minutes of continuous aerobic without signs/symptoms of physical distress  Intensity   THRR 40-80% of Max Heartrate 61-122    Ratings of Perceived  Exertion 11-13    Perceived Dyspnea 0-4      Progression   Progression Continue progressive overload as per policy without signs/symptoms or physical distress.      Resistance Training   Training Prescription Yes    Weight 3    Reps 10-15          Perform Capillary Blood Glucose checks as needed.  Exercise Prescription Changes:   Exercise Prescription Changes     Row Name 03/04/24 [phone]             Response to Exercise   Blood Pressure (Admit) 118/78       Blood Pressure (Exercise) 126/74       Blood Pressure (Exit) 112/70       Heart Rate (Admit) 74 bpm       Heart Rate (Exercise) 92 bpm       Heart Rate (Exit) 82 bpm       Rating of Perceived Exertion (Exercise) 11       Perceived Dyspnea (Exercise) 0       Symptoms none       Comments Pt first day in the [facility] program       Duration Progress to 30 minutes of  aerobic without signs/symptoms of physical distress       Intensity THRR unchanged         Progression   Progression Continue to progress workloads to maintain intensity without signs/symptoms of physical distress.       Average METs 3.52         Resistance Training   Training Prescription Yes       Weight 3       Reps 10-15       Time 10 Minutes         NuStep   Level 2       SPM 89       Minutes 15       METs 2.4         Track   Laps 13       Minutes 15       METs 2.66          Exercise Comments:   Exercise Comments     Row Name 03/04/24 0831           Exercise Comments Pt first day in the [facility] program. Pt tolerated exercise well with an average MET level of 3.53. He is off to a good start and is leanring his THRR, RPE and ExRx          Exercise Goals and Review:   Exercise Goals     Row Name 02/29/24 0849             Exercise Goals   Increase Physical Activity Yes       Intervention Provide advice, education, support and counseling about physical activity/exercise needs.;Develop an individualized exercise  prescription for aerobic and resistive training based on initial evaluation findings, risk stratification, comorbidities and participant's personal goals.       Expected Outcomes Short Term: Attend rehab on a regular basis to increase amount of physical activity.;Long Term: Add in home exercise to make exercise part of routine and to increase amount of physical activity.;Long Term: Exercising regularly at least 3-5 days a week.       Able to understand and use rate of  perceived exertion (RPE) scale Yes       Intervention Provide education and explanation on how to use RPE scale       Expected Outcomes Short Term: Able to use RPE daily in rehab to express subjective intensity level;Long Term:  Able to use RPE to guide intensity level when exercising independently       Knowledge and understanding of Target Heart Rate Range (THRR) Yes       Intervention Provide education and explanation of THRR including how the numbers were predicted and where they are located for reference       Expected Outcomes Short Term: Able to state/look up THRR;Long Term: Able to use THRR to govern intensity when exercising independently;Short Term: Able to use daily as guideline for intensity in rehab       Understanding of Exercise Prescription Yes       Intervention Provide education, explanation, and written materials on patient's individual exercise prescription       Expected Outcomes Short Term: Able to explain program exercise prescription;Long Term: Able to explain home exercise prescription to exercise independently          Exercise Goals Re-Evaluation :  Exercise Goals Re-Evaluation     Row Name 03/04/24 0831             Exercise Goal Re-Evaluation   Exercise Goals Review Increase Physical Activity;Understanding of Exercise Prescription;Increase Strength and Stamina;Knowledge and understanding of Target Heart Rate Range (THRR);Able to understand and use rate of perceived exertion (RPE) scale       Comments  Pt first day in the [facility] program. Pt tolerated exercise well with an average MET level of 3.53. He is off to a good start and is leanring his THRR, RPE and ExRx       Expected Outcomes Will continue to monitor pt and progress workloads as tolerated without sign or symptom          Discharge Exercise Prescription (Final Exercise Prescription Changes):  Exercise Prescription Changes - 03/04/24 0828       Response to Exercise   Blood Pressure (Admit) 118/78    Blood Pressure (Exercise) 126/74    Blood Pressure (Exit) 112/70    Heart Rate (Admit) 74 bpm    Heart Rate (Exercise) 92 bpm    Heart Rate (Exit) 82 bpm    Rating of Perceived Exertion (Exercise) 11    Perceived Dyspnea (Exercise) 0    Symptoms none    Comments Pt first day in the [facility] program    Duration Progress to 30 minutes of  aerobic without signs/symptoms of physical distress    Intensity THRR unchanged      Progression   Progression Continue to progress workloads to maintain intensity without signs/symptoms of physical distress.    Average METs 3.52      Resistance Training   Training Prescription Yes    Weight 3    Reps 10-15    Time 10 Minutes      NuStep   Level 2    SPM 89    Minutes 15    METs 2.4      Track   Laps 13    Minutes 15    METs 2.66          Nutrition:  Target Goals: Understanding of nutrition guidelines, daily intake of sodium 1500mg , cholesterol 200mg , calories 30% from fat and 7% or less from saturated fats, daily to have 5 or more servings  of fruits and vegetables.  Biometrics:  Pre Biometrics - 02/29/24 0830       Pre Biometrics   Waist Circumference 42 inches    Hip Circumference 40 inches    Waist to Hip Ratio 1.05 %    Triceps Skinfold 6 mm    % Body Fat 25 %    Grip Strength 36 kg    Flexibility 16.75 in    Single Leg Stand 10 seconds           Nutrition Therapy Plan and Nutrition Goals:   Nutrition Assessments:  MEDIFICTS Score  Key: >=70 Need to make dietary changes  40-70 Heart Healthy Diet <= 40 Therapeutic Level Cholesterol Diet    Picture Your Plate Scores: <59 Unhealthy dietary pattern with much room for improvement. 41-50 Dietary pattern unlikely to meet recommendations for good health and room for improvement. 51-60 More healthful dietary pattern, with some room for improvement.  >60 Healthy dietary pattern, although there may be some specific behaviors that could be improved.    Nutrition Goals Re-Evaluation:   Nutrition Goals Re-Evaluation:   Nutrition Goals Discharge (Final Nutrition Goals Re-Evaluation):   Psychosocial: Target Goals: Acknowledge presence or absence of significant depression and/or stress, maximize coping skills, provide positive support system. Participant is able to verbalize types and ability to use techniques and skills needed for reducing stress and depression.  Initial Review & Psychosocial Screening:  Initial Psych Review & Screening - 03/11/24 1701       Initial Review   Current issues with --      Family Dynamics   Good Support System? --      Barriers   Psychosocial barriers to participate in program --      Screening Interventions   Interventions --    Expected Outcomes --          Quality of Life Scores:  Quality of Life - 02/29/24 1125       Quality of Life   Select Quality of Life      Quality of Life Scores   Health/Function Pre 24.37 %    Socioeconomic Pre 27.93 %    Psych/Spiritual Pre 26.57 %    Family Pre 25.13 %    GLOBAL Pre 25.68 %         Scores of 19 and below usually indicate a poorer quality of life in these areas.  A difference of  2-3 points is a clinically meaningful difference.  A difference of 2-3 points in the total score of the Quality of Life Index has been associated with significant improvement in overall quality of life, self-image, physical symptoms, and general health in studies assessing change in quality of  life.  PHQ-9: Review Flowsheet       02/29/2024 04/13/2015 01/05/2015  Depression screen PHQ 2/9  Decreased Interest 0 0 0  Down, Depressed, Hopeless 0 0 0  PHQ - 2 Score 0 0 0  Altered sleeping 0 - -  Tired, decreased energy 1 - -  Change in appetite 0 - -  Feeling bad or failure about yourself  0 - -  Trouble concentrating 0 - -  Moving slowly or fidgety/restless 0 - -  Suicidal thoughts 0 - -  PHQ-9 Score 1 - -  Difficult doing work/chores Not difficult at all - -   Interpretation of Total Score  Total Score Depression Severity:  1-4 = Minimal depression, 5-9 = Mild depression, 10-14 = Moderate depression, 15-19 = Moderately severe depression,  20-27 = Severe depression   Psychosocial Evaluation and Intervention:   Psychosocial Re-Evaluation:  Psychosocial Re-Evaluation     Row Name 03/11/24 [phone]             Psychosocial Re-Evaluation   Current issues with None Identified       Interventions Encouraged to attend Cardiac Rehabilitation for the exercise       Continue Psychosocial Services  No Follow up required          Psychosocial Discharge (Final Psychosocial Re-Evaluation):  Psychosocial Re-Evaluation - 03/11/24 0846       Psychosocial Re-Evaluation   Current issues with None Identified    Interventions Encouraged to attend Cardiac Rehabilitation for the exercise    Continue Psychosocial Services  No Follow up required          Vocational Rehabilitation: Provide vocational rehab assistance to qualifying candidates.   Vocational Rehab Evaluation & Intervention:  Vocational Rehab - 02/29/24 0851       Initial Vocational Rehab Evaluation & Intervention   Assessment shows need for Vocational Rehabilitation No   Patient is retired         Education: Education Goals: Education classes will be provided on a weekly basis, covering required topics. Participant will state understanding/return demonstration of topics presented.    Education     Row  Name 03/04/24 0900     Education   Cardiac Education Topics [facility]   Select Workshops     Workshops   Educator Exercise Physiologist   Select Exercise   Exercise Workshop Location Manager and Fall Prevention   Instruction Review Code 1- Verbalizes Understanding   Class Start Time 0815   Class Stop Time 0850   Class Time Calculation (min) 35 min    Row Name 03/06/24 1000     Education   Cardiac Education Topics [facility]   Secondary School Teacher School   Educator Nurse;Respiratory Therapist   Weekly Topic Fast and Healthy Breakfasts   Instruction Review Code 1- Verbalizes Understanding   Class Start Time 0815   Class Stop Time [phone]   Class Time Calculation (min) 37 min    Row Name 03/08/24 0800     Education   Cardiac Education Topics [facility]   Select Core Videos     Core Videos   Educator Dietitian   Select Nutrition   Nutrition Other  label reading   Instruction Review Code 1- Verbalizes Understanding   Class Start Time 0815   Class Stop Time 0847   Class Time Calculation (min) 32 min    Row Name 03/11/24 0900     Education   Cardiac Education Topics [facility]   Select Core Videos     Core Videos   Educator Exercise Physiologist   Select General Education   General Education Metabolic Syndrome and Belly Fat   Instruction Review Code 1- Verbalizes Understanding   Class Start Time [phone]   Class Stop Time 0849   Class Time Calculation (min) 38 min      Core Videos: Exercise    Move It!  Clinical staff conducted group or individual video education with verbal and written material and guidebook.  Patient learns the recommended [facility] exercise program. Exercise with the goal of living a long, healthy life. Some of the health benefits of exercise include controlled diabetes, healthier blood pressure levels, improved cholesterol levels, improved heart and lung capacity, improved sleep, and better body composition. Everyone should speak with their  doctor  before starting or changing an exercise routine.  Biomechanical Limitations Clinical staff conducted group or individual video education with verbal and written material and guidebook.  Patient learns how biomechanical limitations can impact exercise and how we can mitigate and possibly overcome limitations to have an impactful and balanced exercise routine.  Body Composition Clinical staff conducted group or individual video education with verbal and written material and guidebook.  Patient learns that body composition (ratio of muscle mass to fat mass) is a key component to assessing overall fitness, rather than body weight alone. Increased fat mass, especially visceral belly fat, can put us  at increased risk for metabolic syndrome, type 2 diabetes, heart disease, and even death. It is recommended to combine diet and exercise (cardiovascular and resistance training) to improve your body composition. Seek guidance from your physician and exercise physiologist before implementing an exercise routine.  Exercise Action Plan Clinical staff conducted group or individual video education with verbal and written material and guidebook.  Patient learns the recommended strategies to achieve and enjoy long-term exercise adherence, including variety, self-motivation, self-efficacy, and positive decision making. Benefits of exercise include fitness, good health, weight management, more energy, better sleep, less stress, and overall well-being.  Medical   Heart Disease Risk Reduction Clinical staff conducted group or individual video education with verbal and written material and guidebook.  Patient learns our heart is our most vital organ as it circulates oxygen, nutrients, white blood cells, and hormones throughout the entire body, and carries waste away. Data supports a plant-based eating plan like the [facility] Program for its effectiveness in slowing progression of and reversing heart disease. The  video provides a number of recommendations to address heart disease.   Metabolic Syndrome and Belly Fat  Clinical staff conducted group or individual video education with verbal and written material and guidebook.  Patient learns what metabolic syndrome is, how it leads to heart disease, and how one can reverse it and keep it from coming back. You have metabolic syndrome if you have 3 of the following 5 criteria: abdominal obesity, high blood pressure, high triglycerides, low HDL cholesterol, and high blood sugar.  Hypertension and Heart Disease Clinical staff conducted group or individual video education with verbal and written material and guidebook.  Patient learns that high blood pressure, or hypertension, is very common in the [place] . Hypertension is largely due to excessive salt intake, but other important risk factors include being overweight, physical inactivity, drinking too much alcohol, smoking, and not eating enough potassium from fruits and vegetables. High blood pressure is a leading risk factor for heart attack, stroke, congestive heart failure, dementia, kidney failure, and premature death. Long-term effects of excessive salt intake include stiffening of the arteries and thickening of heart muscle and organ damage. Recommendations include ways to reduce hypertension and the risk of heart disease.  Diseases of Our Time - Focusing on Diabetes Clinical staff conducted group or individual video education with verbal and written material and guidebook.  Patient learns why the best way to stop diseases of our time is prevention, through food and other lifestyle changes. Medicine (such as prescription pills and surgeries) is often only a Band-Aid on the problem, not a long-term solution. Most common diseases of our time include obesity, type 2 diabetes, hypertension, heart disease, and cancer. The [facility] Program is recommended and has been proven to help reduce, reverse, and/or prevent  the damaging effects of metabolic syndrome.  Nutrition   Overview of the [facility] Eating Plan  Clinical  staff conducted group or individual video education with verbal and written material and guidebook.  Patient learns about the [facility] Eating Plan for disease risk reduction. The [facility] Eating Plan emphasizes a wide variety of unrefined, minimally-processed carbohydrates, like fruits, vegetables, whole grains, and legumes. Go, Caution, and Stop food choices are explained. Plant-based and lean animal proteins are emphasized. Rationale provided for low sodium intake for blood pressure control, low added sugars for blood sugar stabilization, and low added fats and oils for coronary artery disease risk reduction and weight management.  Calorie Density  Clinical staff conducted group or individual video education with verbal and written material and guidebook.  Patient learns about calorie density and how it impacts the [facility] Eating Plan. Knowing the characteristics of the food you choose will help you decide whether those foods will lead to weight gain or weight loss, and whether you want to consume more or less of them. Weight loss is usually a side effect of the [facility] Eating Plan because of its focus on low calorie-dense foods.  Label Reading  Clinical staff conducted group or individual video education with verbal and written material and guidebook.  Patient learns about the [facility] recommended label reading guidelines and corresponding recommendations regarding calorie density, added sugars, sodium content, and whole grains.  Dining Out - Part 1  Clinical staff conducted group or individual video education with verbal and written material and guidebook.  Patient learns that restaurant meals can be sabotaging because they can be so high in calories, fat, sodium, and/or sugar. Patient learns recommended strategies on how to positively address this and avoid unhealthy pitfalls.  Facts on  Fats  Clinical staff conducted group or individual video education with verbal and written material and guidebook.  Patient learns that lifestyle modifications can be just as effective, if not more so, as many medications for lowering your risk of heart disease. A [facility] lifestyle can help to reduce your risk of inflammation and atherosclerosis (cholesterol build-up, or plaque, in the artery walls). Lifestyle interventions such as dietary choices and physical activity address the cause of atherosclerosis. A review of the types of fats and their impact on blood cholesterol levels, along with dietary recommendations to reduce fat intake is also included.  Nutrition Action Plan  Clinical staff conducted group or individual video education with verbal and written material and guidebook.  Patient learns how to incorporate [facility] recommendations into their lifestyle. Recommendations include planning and keeping personal health goals in mind as an important part of their success.  Healthy Mind-Set    Healthy Minds, Bodies, Hearts  Clinical staff conducted group or individual video education with verbal and written material and guidebook.  Patient learns how to identify when they are stressed. Video will discuss the impact of that stress, as well as the many benefits of stress management. Patient will also be introduced to stress management techniques. The way we think, act, and feel has an impact on our hearts.  How Our Thoughts Can Heal Our Hearts  Clinical staff conducted group or individual video education with verbal and written material and guidebook.  Patient learns that negative thoughts can cause depression and anxiety. This can result in negative lifestyle behavior and serious health problems. Cognitive behavioral therapy is an effective method to help control our thoughts in order to change and improve our emotional outlook.  Additional Videos:  Exercise    Improving Performance  Clinical  staff conducted group or individual video education with verbal and written material and  guidebook.  Patient learns to use a non-linear approach by alternating intensity levels and lengths of time spent exercising to help burn more calories and lose more body fat. Cardiovascular exercise helps improve heart health, metabolism, hormonal balance, blood sugar control, and recovery from fatigue. Resistance training improves strength, endurance, balance, coordination, reaction time, metabolism, and muscle mass. Flexibility exercise improves circulation, posture, and balance. Seek guidance from your physician and exercise physiologist before implementing an exercise routine and learn your capabilities and proper form for all exercise.  Introduction to Yoga  Clinical staff conducted group or individual video education with verbal and written material and guidebook.  Patient learns about yoga, a discipline of the coming together of mind, breath, and body. The benefits of yoga include improved flexibility, improved range of motion, better posture and core strength, increased lung function, weight loss, and positive self-image. Yoga's heart health benefits include lowered blood pressure, healthier heart rate, decreased cholesterol and triglyceride levels, improved immune function, and reduced stress. Seek guidance from your physician and exercise physiologist before implementing an exercise routine and learn your capabilities and proper form for all exercise.  Medical   Aging: Enhancing Your Quality of Life  Clinical staff conducted group or individual video education with verbal and written material and guidebook.  Patient learns key strategies and recommendations to stay in good physical health and enhance quality of life, such as prevention strategies, having an advocate, securing a Health Care Proxy and Power of Attorney, and keeping a list of medications and system for tracking them. It also discusses how to  avoid risk for bone loss.  Biology of Weight Control  Clinical staff conducted group or individual video education with verbal and written material and guidebook.  Patient learns that weight gain occurs because we consume more calories than we burn (eating more, moving less). Even if your body weight is normal, you may have higher ratios of fat compared to muscle mass. Too much body fat puts you at increased risk for cardiovascular disease, heart attack, stroke, type 2 diabetes, and obesity-related cancers. In addition to exercise, following the Pritikin Eating Plan can help reduce your risk.  Decoding Lab Results  Clinical staff conducted group or individual video education with verbal and written material and guidebook.  Patient learns that lab test reflects one measurement whose values change over time and are influenced by many factors, including medication, stress, sleep, exercise, food, hydration, pre-existing medical conditions, and more. It is recommended to use the knowledge from this video to become more involved with your lab results and evaluate your numbers to speak with your doctor.   Diseases of Our Time - Overview  Clinical staff conducted group or individual video education with verbal and written material and guidebook.  Patient learns that according to the CDC, 50% to 70% of chronic diseases (such as obesity, type 2 diabetes, elevated lipids, hypertension, and heart disease) are avoidable through lifestyle improvements including healthier food choices, listening to satiety cues, and increased physical activity.  Sleep Disorders Clinical staff conducted group or individual video education with verbal and written material and guidebook.  Patient learns how good quality and duration of sleep are important to overall health and well-being. Patient also learns about sleep disorders and how they impact health along with recommendations to address them, including discussing with a  physician.  Nutrition  Dining Out - Part 2 Clinical staff conducted group or individual video education with verbal and written material and guidebook.  Patient learns how to  plan ahead and communicate in order to maximize their dining experience in a healthy and nutritious manner. Included are recommended food choices based on the type of restaurant the patient is visiting.   Fueling a Banker conducted group or individual video education with verbal and written material and guidebook.  There is a strong connection between our food choices and our health. Diseases like obesity and type 2 diabetes are very prevalent and are in large-part due to lifestyle choices. The Pritikin Eating Plan provides plenty of food and hunger-curbing satisfaction. It is easy to follow, affordable, and helps reduce health risks.  Menu Workshop  Clinical staff conducted group or individual video education with verbal and written material and guidebook.  Patient learns that restaurant meals can sabotage health goals because they are often packed with calories, fat, sodium, and sugar. Recommendations include strategies to plan ahead and to communicate with the manager, chef, or server to help order a healthier meal.  Planning Your Eating Strategy  Clinical staff conducted group or individual video education with verbal and written material and guidebook.  Patient learns about the Pritikin Eating Plan and its benefit of reducing the risk of disease. The Pritikin Eating Plan does not focus on calories. Instead, it emphasizes high-quality, nutrient-rich foods. By knowing the characteristics of the foods, we choose, we can determine their calorie density and make informed decisions.  Targeting Your Nutrition Priorities  Clinical staff conducted group or individual video education with verbal and written material and guidebook.  Patient learns that lifestyle habits have a tremendous impact on disease  risk and progression. This video provides eating and physical activity recommendations based on your personal health goals, such as reducing LDL cholesterol, losing weight, preventing or controlling type 2 diabetes, and reducing high blood pressure.  Vitamins and Minerals  Clinical staff conducted group or individual video education with verbal and written material and guidebook.  Patient learns different ways to obtain key vitamins and minerals, including through a recommended healthy diet. It is important to discuss all supplements you take with your doctor.   Healthy Mind-Set    Smoking Cessation  Clinical staff conducted group or individual video education with verbal and written material and guidebook.  Patient learns that cigarette smoking and tobacco addiction pose a serious health risk which affects millions of people. Stopping smoking will significantly reduce the risk of heart disease, lung disease, and many forms of cancer. Recommended strategies for quitting are covered, including working with your doctor to develop a successful plan.  Culinary   Becoming a Set Designer conducted group or individual video education with verbal and written material and guidebook.  Patient learns that cooking at home can be healthy, cost-effective, quick, and puts them in control. Keys to cooking healthy recipes will include looking at your recipe, assessing your equipment needs, planning ahead, making it simple, choosing cost-effective seasonal ingredients, and limiting the use of added fats, salts, and sugars.  Cooking - Breakfast and Snacks  Clinical staff conducted group or individual video education with verbal and written material and guidebook.  Patient learns how important breakfast is to satiety and nutrition through the entire day. Recommendations include key foods to eat during breakfast to help stabilize blood sugar levels and to prevent overeating at meals later in the day.  Planning ahead is also a key component.  Cooking - Educational Psychologist conducted group or individual video education with verbal and written material and  guidebook.  Patient learns eating strategies to improve overall health, including an approach to cook more at home. Recommendations include thinking of animal protein as a side on your plate rather than center stage and focusing instead on lower calorie dense options like vegetables, fruits, whole grains, and plant-based proteins, such as beans. Making sauces in large quantities to freeze for later and leaving the skin on your vegetables are also recommended to maximize your experience.  Cooking - Healthy Salads and Dressing Clinical staff conducted group or individual video education with verbal and written material and guidebook.  Patient learns that vegetables, fruits, whole grains, and legumes are the foundations of the Pritikin Eating Plan. Recommendations include how to incorporate each of these in flavorful and healthy salads, and how to create homemade salad dressings. Proper handling of ingredients is also covered. Cooking - Soups and [employer] - Soups and Desserts Clinical staff conducted group or individual video education with verbal and written material and guidebook.  Patient learns that Pritikin soups and desserts make for easy, nutritious, and delicious snacks and meal components that are low in sodium, fat, sugar, and calorie density, while high in vitamins, minerals, and filling fiber. Recommendations include simple and healthy ideas for soups and desserts.   Overview     The Pritikin Solution Program Overview Clinical staff conducted group or individual video education with verbal and written material and guidebook.  Patient learns that the results of the Pritikin Program have been documented in more than 100 articles published in peer-reviewed journals, and the benefits include reducing risk factors for  (and, in some cases, even reversing) high cholesterol, high blood pressure, type 2 diabetes, obesity, and more! An overview of the three key pillars of the Pritikin Program will be covered: eating well, doing regular exercise, and having a healthy mind-set.  WORKSHOPS  Exercise: Exercise Basics: Building Your Action Plan Clinical staff led group instruction and group discussion with PowerPoint presentation and patient guidebook. To enhance the learning environment the use of posters, models and videos may be added. At the conclusion of this workshop, patients will comprehend the difference between physical activity and exercise, as well as the benefits of incorporating both, into their routine. Patients will understand the FITT (Frequency, Intensity, Time, and Type) principle and how to use it to build an exercise action plan. In addition, safety concerns and other considerations for exercise and cardiac rehab will be addressed by the presenter. The purpose of this lesson is to promote a comprehensive and effective weekly exercise routine in order to improve patients' overall level of fitness.   Managing Heart Disease: Your Path to a Healthier Heart Clinical staff led group instruction and group discussion with PowerPoint presentation and patient guidebook. To enhance the learning environment the use of posters, models and videos may be added.At the conclusion of this workshop, patients will understand the anatomy and physiology of the heart. Additionally, they will understand how Pritikin's three pillars impact the risk factors, the progression, and the management of heart disease.  The purpose of this lesson is to provide a high-level overview of the heart, heart disease, and how the Pritikin lifestyle positively impacts risk factors.  Exercise Biomechanics Clinical staff led group instruction and group discussion with PowerPoint presentation and patient guidebook. To enhance the learning  environment the use of posters, models and videos may be added. Patients will learn how the structural parts of their bodies function and how these functions impact their daily activities, movement, and exercise.  Patients will learn how to promote a neutral spine, learn how to manage pain, and identify ways to improve their physical movement in order to promote healthy living. The purpose of this lesson is to expose patients to common physical limitations that impact physical activity. Participants will learn practical ways to adapt and manage aches and pains, and to minimize their effect on regular exercise. Patients will learn how to maintain good posture while sitting, walking, and lifting.  Balance Training and Fall Prevention  Clinical staff led group instruction and group discussion with PowerPoint presentation and patient guidebook. To enhance the learning environment the use of posters, models and videos may be added. At the conclusion of this workshop, patients will understand the importance of their sensorimotor skills (vision, proprioception, and the vestibular system) in maintaining their ability to balance as they age. Patients will apply a variety of balancing exercises that are appropriate for their current level of function. Patients will understand the common causes for poor balance, possible solutions to these problems, and ways to modify their physical environment in order to minimize their fall risk. The purpose of this lesson is to teach patients about the importance of maintaining balance as they age and ways to minimize their risk of falling.  WORKSHOPS   Nutrition:  Fueling a Ship Broker led group instruction and group discussion with PowerPoint presentation and patient guidebook. To enhance the learning environment the use of posters, models and videos may be added. Patients will review the foundational principles of the Pritikin Eating Plan and understand  what constitutes a serving size in each of the food groups. Patients will also learn Pritikin-friendly foods that are better choices when away from home and review make-ahead meal and snack options. Calorie density will be reviewed and applied to three nutrition priorities: weight maintenance, weight loss, and weight gain. The purpose of this lesson is to reinforce (in a group setting) the key concepts around what patients are recommended to eat and how to apply these guidelines when away from home by planning and selecting Pritikin-friendly options. Patients will understand how calorie density may be adjusted for different weight management goals.  Mindful Eating  Clinical staff led group instruction and group discussion with PowerPoint presentation and patient guidebook. To enhance the learning environment the use of posters, models and videos may be added. Patients will briefly review the concepts of the Pritikin Eating Plan and the importance of low-calorie dense foods. The concept of mindful eating will be introduced as well as the importance of paying attention to internal hunger signals. Triggers for non-hunger eating and techniques for dealing with triggers will be explored. The purpose of this lesson is to provide patients with the opportunity to review the basic principles of the Pritikin Eating Plan, discuss the value of eating mindfully and how to measure internal cues of hunger and fullness using the Hunger Scale. Patients will also discuss reasons for non-hunger eating and learn strategies to use for controlling emotional eating.  Targeting Your Nutrition Priorities Clinical staff led group instruction and group discussion with PowerPoint presentation and patient guidebook. To enhance the learning environment the use of posters, models and videos may be added. Patients will learn how to determine their genetic susceptibility to disease by reviewing their family history. Patients will gain  insight into the importance of diet as part of an overall healthy lifestyle in mitigating the impact of genetics and other environmental insults. The purpose of this lesson is to provide patients with  the opportunity to assess their personal nutrition priorities by looking at their family history, their own health history and current risk factors. Patients will also be able to discuss ways of prioritizing and modifying the Pritikin Eating Plan for their highest risk areas  Menu  Clinical staff led group instruction and group discussion with PowerPoint presentation and patient guidebook. To enhance the learning environment the use of posters, models and videos may be added. Using menus brought in from [employer], or printed from [employer], patients will apply the Pritikin dining out guidelines that were presented in the [employer] video. Patients will also be able to practice these guidelines in a variety of provided scenarios. The purpose of this lesson is to provide patients with the opportunity to practice hands-on learning of the Pritikin Dining Out guidelines with actual menus and practice scenarios.  Label Reading Clinical staff led group instruction and group discussion with PowerPoint presentation and patient guidebook. To enhance the learning environment the use of posters, models and videos may be added. Patients will review and discuss the Pritikin label reading guidelines presented in Pritikin's Label Reading Educational series video. Using fool labels brought in from local grocery stores and markets, patients will apply the label reading guidelines and determine if the packaged food meet the Pritikin guidelines. The purpose of this lesson is to provide patients with the opportunity to review, discuss, and practice hands-on learning of the Pritikin Label Reading guidelines with actual packaged food labels. Cooking School  Pritikin's [employer] are  designed to teach patients ways to prepare quick, simple, and affordable recipes at home. The importance of nutrition's role in chronic disease risk reduction is reflected in its emphasis in the overall Pritikin program. By learning how to prepare essential core Pritikin Eating Plan recipes, patients will increase control over what they eat; be able to customize the flavor of foods without the use of added salt, sugar, or fat; and improve the quality of the food they consume. By learning a set of core recipes which are easily assembled, quickly prepared, and affordable, patients are more likely to prepare more healthy foods at home. These workshops focus on convenient breakfasts, simple entres, side dishes, and desserts which can be prepared with minimal effort and are consistent with nutrition recommendations for cardiovascular risk reduction. Cooking [employer] are taught by a armed forces logistics/support/administrative officer (RD) who has been trained by the [employer]. The chef or RD has a clear understanding of the importance of minimizing - if not completely eliminating - added fat, sugar, and sodium in recipes. Throughout the series of Cooking School Workshop sessions, patients will learn about healthy ingredients and efficient methods of cooking to build confidence in their capability to prepare    Cooking School weekly topics:  Adding Flavor- Sodium-Free  Fast and Healthy Breakfasts  Powerhouse Plant-Based Proteins  Satisfying Salads and Dressings  Simple Sides and Sauces  International Cuisine-Spotlight on the [employer] Zones  Delicious Desserts  Savory Soups  [employer] - Meals in a Astronomer Appetizers and Snacks  Comforting Weekend Breakfasts  One-Pot Wonders   Fast Evening Meals  Landscape Architect Your Pritikin Plate  WORKSHOPS   Healthy Mindset (Psychosocial):  Focused Goals, Sustainable Changes Clinical staff led group instruction and group discussion  with PowerPoint presentation and patient guidebook. To enhance the learning environment the use of posters, models and videos may be added. Patients will be able to apply effective goal  setting strategies to establish at least one personal goal, and then take consistent, meaningful action toward that goal. They will learn to identify common barriers to achieving personal goals and develop strategies to overcome them. Patients will also gain an understanding of how our mind-set can impact our ability to achieve goals and the importance of cultivating a positive and growth-oriented mind-set. The purpose of this lesson is to provide patients with a deeper understanding of how to set and achieve personal goals, as well as the tools and strategies needed to overcome common obstacles which may arise along the way.  From Head to Heart: The Power of a Healthy Outlook  Clinical staff led group instruction and group discussion with PowerPoint presentation and patient guidebook. To enhance the learning environment the use of posters, models and videos may be added. Patients will be able to recognize and describe the impact of emotions and mood on physical health. They will discover the importance of self-care and explore self-care practices which may work for them. Patients will also learn how to utilize the 4 C's to cultivate a healthier outlook and better manage stress and challenges. The purpose of this lesson is to demonstrate to patients how a healthy outlook is an essential part of maintaining good health, especially as they continue their cardiac rehab journey.  Healthy Sleep for a Healthy Heart Clinical staff led group instruction and group discussion with PowerPoint presentation and patient guidebook. To enhance the learning environment the use of posters, models and videos may be added. At the conclusion of this workshop, patients will be able to demonstrate knowledge of the importance of sleep to overall  health, well-being, and quality of life. They will understand the symptoms of, and treatments for, common sleep disorders. Patients will also be able to identify daytime and nighttime behaviors which impact sleep, and they will be able to apply these tools to help manage sleep-related challenges. The purpose of this lesson is to provide patients with a general overview of sleep and outline the importance of quality sleep. Patients will learn about a few of the most common sleep disorders. Patients will also be introduced to the concept of "sleep hygiene," and discover ways to self-manage certain sleeping problems through simple daily behavior changes. Finally, the workshop will motivate patients by clarifying the links between quality sleep and their goals of heart-healthy living.   Recognizing and Reducing Stress Clinical staff led group instruction and group discussion with PowerPoint presentation and patient guidebook. To enhance the learning environment the use of posters, models and videos may be added. At the conclusion of this workshop, patients will be able to understand the types of stress reactions, differentiate between acute and chronic stress, and recognize the impact that chronic stress has on their health. They will also be able to apply different coping mechanisms, such as reframing negative self-talk. Patients will have the opportunity to practice a variety of stress management techniques, such as deep abdominal breathing, progressive muscle relaxation, and/or guided imagery.  The purpose of this lesson is to educate patients on the role of stress in their lives and to provide healthy techniques for coping with it.  Learning Barriers/Preferences:  Learning Barriers/Preferences - 02/29/24 0851       Learning Barriers/Preferences   Learning Barriers Sight;Exercise Concerns   Right eye almost blind   Learning Preferences Group Instruction;Individual Instruction;Skilled Demonstration;Written  Material;Pictoral          Education Topics:  Knowledge Questionnaire Score:  Knowledge Questionnaire Score -  02/29/24 1123       Knowledge Questionnaire Score   Pre Score 22/24          Core Components/Risk Factors/Patient Goals at Admission:  Personal Goals and Risk Factors at Admission - 02/29/24 0852       Core Components/Risk Factors/Patient Goals on Admission   Diabetes Yes    Intervention Provide education about signs/symptoms and action to take for hypo/hyperglycemia.;Provide education about proper nutrition, including hydration, and aerobic/resistive exercise prescription along with prescribed medications to achieve blood glucose in normal ranges: Fasting glucose 65-99 mg/dL    Expected Outcomes Short Term: Participant verbalizes understanding of the signs/symptoms and immediate care of hyper/hypoglycemia, proper foot care and importance of medication, aerobic/resistive exercise and nutrition plan for blood glucose control.;Long Term: Attainment of HbA1C < 7%.    Heart Failure Yes    Intervention Provide a combined exercise and nutrition program that is supplemented with education, support and counseling about heart failure. Directed toward relieving symptoms such as shortness of breath, decreased exercise tolerance, and extremity edema.    Expected Outcomes Improve functional capacity of life;Short term: Attendance in program 2-3 days a week with increased exercise capacity. Reported lower sodium intake. Reported increased fruit and vegetable intake. Reports medication compliance.;Short term: Daily weights obtained and reported for increase. Utilizing diuretic protocols set by physician.;Long term: Adoption of self-care skills and reduction of barriers for early signs and symptoms recognition and intervention leading to self-care maintenance.    Hypertension Yes    Intervention Provide education on lifestyle modifcations including regular physical activity/exercise, weight  management, moderate sodium restriction and increased consumption of fresh fruit, vegetables, and low fat dairy, alcohol moderation, and smoking cessation.;Monitor prescription use compliance.    Expected Outcomes Short Term: Continued assessment and intervention until BP is < 140/21mm HG in hypertensive participants. < 130/31mm HG in hypertensive participants with diabetes, heart failure or chronic kidney disease.;Long Term: Maintenance of blood pressure at goal levels.    Lipids Yes    Intervention Provide education and support for participant on nutrition & aerobic/resistive exercise along with prescribed medications to achieve LDL 70mg , HDL >40mg .    Expected Outcomes Short Term: Participant states understanding of desired cholesterol values and is compliant with medications prescribed. Participant is following exercise prescription and nutrition guidelines.;Long Term: Cholesterol controlled with medications as prescribed, with individualized exercise RX and with personalized nutrition plan. Value goals: LDL < 70mg , HDL > 40 mg.    Stress Yes    Intervention Offer individual and/or small group education and counseling on adjustment to heart disease, stress management and health-related lifestyle change. Teach and support self-help strategies.;Refer participants experiencing significant psychosocial distress to appropriate mental health specialists for further evaluation and treatment. When possible, include family members and significant others in education/counseling sessions.    Expected Outcomes Short Term: Participant demonstrates changes in health-related behavior, relaxation and other stress management skills, ability to obtain effective social support, and compliance with psychotropic medications if prescribed.;Long Term: Emotional wellbeing is indicated by absence of clinically significant psychosocial distress or social isolation.    Personal Goal Other Yes    Personal Goal ST, get into Ex  routine, heart healthy eating, LT balnce    Intervention Will continue to monitor pt    Expected Outcomes Pt will achieve his goals          Core Components/Risk Factors/Patient Goals Review:   Goals and Risk Factor Review     Row Name 03/11/24 [phone]  Core Components/Risk Factors/Patient Goals Review   Personal Goals Review Diabetes;Heart Failure;Hypertension;Lipids;Stress       Review Trei doing well with exercise at cardiac rehab. Vital signs and CBG's have been stable. Wylan has only had one week of CR sessions since orientation MET levels were obtained and is maintaining these at this time.       Expected Outcomes Cambell will continue to particpate in cardiac rehab for exercise nutriton and lifestyle modifications. Sheree will continue to particpate in cardiac rehab for exercise nutriton and lifestyle modifications.          Core Components/Risk Factors/Patient Goals at Discharge (Final Review):   Goals and Risk Factor Review - 03/11/24 0847       Core Components/Risk Factors/Patient Goals Review   Personal Goals Review Diabetes;Heart Failure;Hypertension;Lipids;Stress    Review Makani doing well with exercise at cardiac rehab. Vital signs and CBG's have been stable. Coston has only had one week of CR sessions since orientation MET levels were obtained and is maintaining these at this time.    Expected Outcomes Baldwin will continue to particpate in cardiac rehab for exercise nutriton and lifestyle modifications. Sheree will continue to particpate in cardiac rehab for exercise nutriton and lifestyle modifications.          ITP Comments:  ITP Comments     Row Name 02/29/24 0755 03/04/24 0900         ITP Comments Dr. Wilbert Bihari medical director. Introduction to [facility]ikin education/intensive cardiac rehab. Initial orientation packet reviewed with patient. 30 Day ITP Review. Markcus started cardiac rehab today (03/04/24) and tolerated exercise well.         Comments: see ITP  comments

## 2024-03-13 ENCOUNTER — Encounter (HOSPITAL_COMMUNITY)
Admission: RE | Admit: 2024-03-13 | Discharge: 2024-03-13 | Disposition: A | Discharge status: Home / Self Care | Payer: Medicare (Managed Care) | Source: Ambulatory Visit | Attending: Internal Medicine | Admitting: Internal Medicine

## 2024-03-13 DIAGNOSIS — Z955 Presence of coronary angioplasty implant and graft: Secondary | ICD-10-CM

## 2024-03-13 DIAGNOSIS — I214 Non-ST elevation (NSTEMI) myocardial infarction: Secondary | ICD-10-CM

## 2024-03-15 ENCOUNTER — Encounter (HOSPITAL_COMMUNITY)
Admission: RE | Admit: 2024-03-15 | Discharge: 2024-03-15 | Disposition: A | Discharge status: Home / Self Care | Payer: Medicare (Managed Care) | Source: Ambulatory Visit | Attending: Internal Medicine | Admitting: Internal Medicine

## 2024-03-15 DIAGNOSIS — Z955 Presence of coronary angioplasty implant and graft: Secondary | ICD-10-CM

## 2024-03-15 DIAGNOSIS — I214 Non-ST elevation (NSTEMI) myocardial infarction: Secondary | ICD-10-CM

## 2024-03-18 ENCOUNTER — Encounter (HOSPITAL_COMMUNITY)
Admission: RE | Admit: 2024-03-18 | Discharge: 2024-03-18 | Disposition: A | Payer: Medicare (Managed Care) | Source: Ambulatory Visit | Attending: Internal Medicine

## 2024-03-18 DIAGNOSIS — I214 Non-ST elevation (NSTEMI) myocardial infarction: Secondary | ICD-10-CM

## 2024-03-18 DIAGNOSIS — Z955 Presence of coronary angioplasty implant and graft: Secondary | ICD-10-CM | POA: Diagnosis not present

## 2024-03-20 ENCOUNTER — Encounter (HOSPITAL_COMMUNITY)
Admission: RE | Admit: 2024-03-20 | Discharge: 2024-03-20 | Disposition: A | Discharge status: Home / Self Care | Payer: Medicare (Managed Care) | Source: Ambulatory Visit | Attending: Internal Medicine | Admitting: Internal Medicine

## 2024-03-20 DIAGNOSIS — I214 Non-ST elevation (NSTEMI) myocardial infarction: Secondary | ICD-10-CM

## 2024-03-20 DIAGNOSIS — Z955 Presence of coronary angioplasty implant and graft: Secondary | ICD-10-CM | POA: Diagnosis not present

## 2024-03-22 ENCOUNTER — Encounter (HOSPITAL_COMMUNITY): Payer: Medicare (Managed Care)

## 2024-03-25 ENCOUNTER — Encounter (HOSPITAL_COMMUNITY): Payer: Medicare (Managed Care)

## 2024-03-27 ENCOUNTER — Encounter (HOSPITAL_COMMUNITY)
Admission: RE | Admit: 2024-03-27 | Discharge: 2024-03-27 | Disposition: A | Payer: Medicare (Managed Care) | Source: Ambulatory Visit | Attending: Internal Medicine

## 2024-03-27 DIAGNOSIS — Z955 Presence of coronary angioplasty implant and graft: Secondary | ICD-10-CM | POA: Diagnosis not present

## 2024-03-27 DIAGNOSIS — I214 Non-ST elevation (NSTEMI) myocardial infarction: Secondary | ICD-10-CM

## 2024-03-29 ENCOUNTER — Encounter (HOSPITAL_COMMUNITY)
Admission: RE | Admit: 2024-03-29 | Discharge: 2024-03-29 | Disposition: A | Discharge status: Home / Self Care | Payer: Medicare (Managed Care) | Source: Ambulatory Visit | Attending: Internal Medicine | Admitting: Internal Medicine

## 2024-03-29 DIAGNOSIS — Z955 Presence of coronary angioplasty implant and graft: Secondary | ICD-10-CM

## 2024-03-29 DIAGNOSIS — I214 Non-ST elevation (NSTEMI) myocardial infarction: Secondary | ICD-10-CM

## 2024-04-01 ENCOUNTER — Encounter (HOSPITAL_COMMUNITY)
Admission: RE | Admit: 2024-04-01 | Discharge: 2024-04-01 | Disposition: A | Payer: Medicare (Managed Care) | Source: Ambulatory Visit | Attending: Internal Medicine

## 2024-04-01 DIAGNOSIS — I214 Non-ST elevation (NSTEMI) myocardial infarction: Secondary | ICD-10-CM

## 2024-04-01 DIAGNOSIS — Z955 Presence of coronary angioplasty implant and graft: Secondary | ICD-10-CM

## 2024-04-03 ENCOUNTER — Encounter (HOSPITAL_COMMUNITY)
Admission: RE | Admit: 2024-04-03 | Discharge: 2024-04-03 | Disposition: A | Payer: Medicare (Managed Care) | Source: Ambulatory Visit | Attending: Internal Medicine

## 2024-04-03 DIAGNOSIS — Z955 Presence of coronary angioplasty implant and graft: Secondary | ICD-10-CM | POA: Diagnosis not present

## 2024-04-03 DIAGNOSIS — I214 Non-ST elevation (NSTEMI) myocardial infarction: Secondary | ICD-10-CM

## 2024-04-05 ENCOUNTER — Encounter (HOSPITAL_COMMUNITY): Payer: Medicare (Managed Care)

## 2024-04-08 ENCOUNTER — Encounter (HOSPITAL_COMMUNITY): Admission: RE | Admit: 2024-04-08 | Payer: Medicare (Managed Care)

## 2024-04-10 ENCOUNTER — Encounter (HOSPITAL_COMMUNITY)
Admission: RE | Admit: 2024-04-10 | Discharge: 2024-04-10 | Disposition: A | Payer: Medicare (Managed Care) | Source: Ambulatory Visit | Attending: Internal Medicine

## 2024-04-10 DIAGNOSIS — Z955 Presence of coronary angioplasty implant and graft: Secondary | ICD-10-CM | POA: Diagnosis present

## 2024-04-10 DIAGNOSIS — I214 Non-ST elevation (NSTEMI) myocardial infarction: Secondary | ICD-10-CM | POA: Insufficient documentation

## 2024-04-10 NOTE — Progress Notes (Signed)
 Cardiac Individual Treatment Plan  Patient Details  Name: Hayden Williams MRN: [id number] Date of Birth: Sep 17, 1955 Referring Provider:   Flowsheet Row CARDIAC REHAB PHASE II ORIENTATION from 02/29/2024 in [facility]  Referring Provider Dr. Stanly Leavens MD    Initial Encounter Date:  Flowsheet Row CARDIAC REHAB PHASE II ORIENTATION from 02/29/2024 in [facility]  Date 02/29/24    Visit Diagnosis: 01/02/24 NSTEMI  01/03/24 DES RCA  Patient's Home Medications on Admission:  Current Outpatient Medications:    allopurinol  (ZYLOPRIM ) 100 MG tablet, Take 100 mg by mouth 2 (two) times daily., Disp: , Rfl:    amLODipine  (NORVASC ) 10 MG tablet, Take 10 mg by mouth every evening., Disp: , Rfl: 3   aspirin  EC 81 MG tablet, Take 1 tablet (81 mg total) by mouth daily. To start in 4 weeks once Plavix  and Brillinta course completed, Disp: , Rfl:    atorvastatin  (LIPITOR ) 80 MG tablet, TAKE 1 TABLET BY MOUTH DAILY, Disp: 15 tablet, Rfl: 0   Cholecalciferol (VITAMIN D-3 PO), Take 1 capsule by mouth daily., Disp: , Rfl:    ezetimibe  (ZETIA ) 10 MG tablet, Take 1 tablet (10 mg total) by mouth daily., Disp: 30 tablet, Rfl: 1   insulin  aspart (NOVOLOG  FLEXPEN) 100 UNIT/ML FlexPen, Inject 3-10 Units into the skin See admin instructions. Inject 3-10 units three times daily before each meal, per sliding scale: Under 100: 3 units 100 - 150 : 5 units 151 - 200 : 7 units 201 - 250 : 8 units 251 - 300 : 9 units Over 300 : 10 units, Disp: , Rfl:    insulin  glargine, 1 Unit Dial , (TOUJEO ) 300 UNIT/ML Solostar Pen, Inject 44 Units into the skin daily., Disp: , Rfl:    IRON-VITAMIN C  PO, Take 1 tablet by mouth daily., Disp: , Rfl:    isosorbide  mononitrate (IMDUR ) 30 MG 24 hr tablet, Take 1 tablet (30 mg total) by mouth daily., Disp: 90 tablet, Rfl: 3   metoprolol  tartrate (LOPRESSOR ) 100 MG  tablet, TAKE 1 TABLET BY MOUTH 2 TIMES A DAY, Disp: 60 tablet, Rfl: 0   MOUNJARO 10 MG/0.5ML Pen, Inject 10 mg into the skin once a week., Disp: , Rfl:    nitroGLYCERIN  (NITROSTAT ) 0.4 MG SL tablet, Place 1 tablet (0.4 mg total) under the tongue every 5 (five) minutes as needed for chest pain., Disp: 25 tablet, Rfl: 4   pantoprazole  (PROTONIX ) 40 MG tablet, Take 1 tablet (40 mg total) by mouth daily., Disp: 90 tablet, Rfl: 3   tamsulosin  (FLOMAX ) 0.4 MG CAPS capsule, Take 0.4 mg by mouth daily., Disp: , Rfl:    ticagrelor  (BRILINTA ) 90 MG TABS tablet, Take 1 tablet (90 mg total) by mouth 2 (two) times daily., Disp: 60 tablet, Rfl: 11  Past Medical History: Past Medical History:  Diagnosis Date   CAD (coronary artery disease)    a. 11/2014 Cath/PCI: LM nl, LAD 50p, 60d, RI 40, LCX small, OM1 40, OM2 40, RCA 95d (2.25x20 Promus Premier DES), EF nl.   CKD (chronic kidney disease), stage III (HCC)    Diabetes mellitus (HCC)    Esophageal ulcer    Essential hypertension    GI bleed 06/2018   a. melena/ABL anemia with EGD on 06/27/18 with healed esophageal ulcer, esophagitis, hiatal hernia.   Hyperlipidemia    Sleep apnea     Tobacco Use: Social History  Tobacco Use  Smoking Status Never  Smokeless Tobacco Never    Labs: Review Flowsheet  More data may exist      Latest Ref Rng & Units 09/11/2015 06/26/2018 10/25/2023 10/27/2023 01/02/2024  Labs for ITP Cardiac and Pulmonary Rehab  Cholestrol 0 - 200 mg/dL 846  98  - 893  71   LDL (calc) 0 - 99 mg/dL 92  46  - 53  22   HDL-C >40 mg/dL 35  27  - 27  27   Trlycerides <150 mg/dL 871  876  - 871  889   Hemoglobin A1c 4.8 - 5.6 % - 7.2  - 7.7  7.3   TCO2 22 - 32 mmol/L - - 22  - -     Exercise Target Goals: Exercise Program Goal: Individual exercise prescription set using results from initial 6 min walk test and THRR while considering  patient's activity barriers and safety.   Exercise Prescription Goal: Initial exercise prescription  builds to 30-45 minutes a day of aerobic activity, 2-3 days per week.  Home exercise guidelines will be given to patient during program as part of exercise prescription that the participant will acknowledge.   Education: Aerobic Exercise: - Group verbal and visual presentation on the components of exercise prescription. Introduces F.I.T.T principle from ACSM for exercise prescriptions.  Reviews F.I.T.T. principles of aerobic exercise including progression. Written material provided at class time.   Education: Resistance Exercise: - Group verbal and visual presentation on the components of exercise prescription. Introduces F.I.T.T principle from ACSM for exercise prescriptions  Reviews F.I.T.T. principles of resistance exercise including progression. Written material provided at class time.    Education: Exercise & Equipment Safety: - Individual verbal instruction and demonstration of equipment use and safety with use of the equipment.   Education: Exercise Physiology & General Exercise Guidelines: - Group verbal and written instruction with models to review the exercise physiology of the cardiovascular system and associated critical values. Provides general exercise guidelines with specific guidelines to those with heart or lung disease. Written material provided at class time.   Education: Flexibility, Balance, Mind/Body Relaxation: - Group verbal and visual presentation with interactive activity on the components of exercise prescription. Introduces F.I.T.T principle from ACSM for exercise prescriptions. Reviews F.I.T.T. principles of flexibility and balance exercise training including progression. Also discusses the mind body connection.  Reviews various relaxation techniques to help reduce and manage stress (i.e. Deep breathing, progressive muscle relaxation, and visualization). Balance handout provided to take home. Written material provided at class time.   Activity Barriers & Risk  Stratification:  Activity Barriers & Cardiac Risk Stratification - 02/29/24 0849       Activity Barriers & Cardiac Risk Stratification   Activity Barriers Deconditioning;Decreased Ventricular Function;Balance Concerns    Cardiac Risk Stratification High          6 Minute Walk:  6 Minute Walk     Row Name 02/29/24 1136         6 Minute Walk   Phase Initial     Distance 1340 feet     Walk Time 6 minutes     # of Rest Breaks 0     MPH 2.54     METS 3.3     RPE 11     Perceived Dyspnea  0     VO2 Peak 11.5     Symptoms No     Resting HR 83 bpm     Resting BP 122/78  Resting Oxygen Saturation  96 %     Exercise Oxygen Saturation  during 6 min walk 98 %     Max Ex. HR 104 bpm     Max Ex. BP 154/82     2 Minute Post BP 132/80        Oxygen Initial Assessment:   Oxygen Re-Evaluation:   Oxygen Discharge (Final Oxygen Re-Evaluation):   Initial Exercise Prescription:  Initial Exercise Prescription - 02/29/24 1300       Date of Initial Exercise RX and Referring Provider   Date 02/29/24    Referring Provider Dr. Stanly Leavens MD    Expected Discharge Date 04/10/24      NuStep   Level 2    SPM 75    Minutes 15    METs 2.1      Track   Laps 15    Minutes 15    METs 2.2      Prescription Details   Frequency (times per week) 3    Duration Progress to 30 minutes of continuous aerobic without signs/symptoms of physical distress      Intensity   THRR 40-80% of Max Heartrate 61-122    Ratings of Perceived Exertion 11-13    Perceived Dyspnea 0-4      Progression   Progression Continue progressive overload as per policy without signs/symptoms or physical distress.      Resistance Training   Training Prescription Yes    Weight 3    Reps 10-15          Perform Capillary Blood Glucose checks as needed.  Exercise Prescription Changes:   Exercise Prescription Changes     Row Name 03/04/24 0828 03/27/24 0830 04/10/24 0825          Response to Exercise   Blood Pressure (Admit) 118/78 112/72 96/70     Blood Pressure (Exercise) 126/74 -- --     Blood Pressure (Exit) 112/70 110/78 110/64     Heart Rate (Admit) 74 bpm 77 bpm 87 bpm     Heart Rate (Exercise) 92 bpm 120 bpm 114 bpm     Heart Rate (Exit) 82 bpm 90 bpm 93 bpm     Rating of Perceived Exertion (Exercise) 11 9.5 10.5     Perceived Dyspnea (Exercise) 0 0 0     Symptoms none none none     Comments Pt first day in the [employer] program Reviewed MET's Reviewed MET's and goals     Duration Progress to 30 minutes of  aerobic without signs/symptoms of physical distress Progress to 30 minutes of  aerobic without signs/symptoms of physical distress Progress to 30 minutes of  aerobic without signs/symptoms of physical distress     Intensity THRR unchanged THRR unchanged THRR unchanged       Progression   Progression Continue to progress workloads to maintain intensity without signs/symptoms of physical distress. Continue to progress workloads to maintain intensity without signs/symptoms of physical distress. Continue to progress workloads to maintain intensity without signs/symptoms of physical distress.     Average METs 3.52 4.22 3.82       Resistance Training   Training Prescription Yes No No     Weight 3 3 3      Reps 10-15 10-15 10-15     Time 10 Minutes 10 Minutes 10 Minutes       NuStep   Level 2 3 3      SPM 89 156 149     Minutes 15 15  15     METs 2.4 4.5 4.2       Track   Laps 13 23 19      Minutes 15 15 15      METs 2.66 3.93 3.43        Exercise Comments:   Exercise Comments     Row Name 03/04/24 0831 03/27/24 0830 04/10/24 0831       Exercise Comments Pt first day in the [place] [facility] program. Pt tolerated exercise well with an average MET level of 3.53. He is off to a good start and is leanring his THRR, RPE and ExRx Reviewed MET's. Pt tolerated exercise well with an average MET level of 4.22. He is doing well and increasing WL's and MET's  Reviewed MET's and goals. Pt tolerated exercise well with an average MET level of 3.82. He has been absent due to travel, but it back today and feeling well. MET's a little lower today, but he will get back into the routine soon. He feels good about his goals and is getting great info from the RD about heart healthy nutrition, and he is feeling better about his balance.        Exercise Goals and Review:   Exercise Goals     Row Name 02/29/24 0849             Exercise Goals   Increase Physical Activity Yes       Intervention Provide advice, education, support and counseling about physical activity/exercise needs.;Develop an individualized exercise prescription for aerobic and resistive training based on initial evaluation findings, risk stratification, comorbidities and participant's personal goals.       Expected Outcomes Short Term: Attend rehab on a regular basis to increase amount of physical activity.;Long Term: Add in home exercise to make exercise part of routine and to increase amount of physical activity.;Long Term: Exercising regularly at least 3-5 days a week.       Able to understand and use rate of perceived exertion (RPE) scale Yes       Intervention Provide education and explanation on how to use RPE scale       Expected Outcomes Short Term: Able to use RPE daily in rehab to express subjective intensity level;Long Term:  Able to use RPE to guide intensity level when exercising independently       Knowledge and understanding of Target Heart Rate Range (THRR) Yes       Intervention Provide education and explanation of THRR including how the numbers were predicted and where they are located for reference       Expected Outcomes Short Term: Able to state/look up THRR;Long Term: Able to use THRR to govern intensity when exercising independently;Short Term: Able to use daily as guideline for intensity in rehab       Understanding of Exercise Prescription Yes       Intervention  Provide education, explanation, and written materials on patient's individual exercise prescription       Expected Outcomes Short Term: Able to explain program exercise prescription;Long Term: Able to explain home exercise prescription to exercise independently          Exercise Goals Re-Evaluation :  Exercise Goals Re-Evaluation     Row Name 03/04/24 0831 04/10/24 0828           Exercise Goal Re-Evaluation   Exercise Goals Review Increase Physical Activity;Understanding of Exercise Prescription;Increase Strength and Stamina;Knowledge and understanding of Target Heart Rate Range (THRR);Able to understand and use rate of  perceived exertion (RPE) scale Increase Physical Activity;Understanding of Exercise Prescription;Increase Strength and Stamina;Knowledge and understanding of Target Heart Rate Range (THRR);Able to understand and use rate of perceived exertion (RPE) scale      Comments Pt first day in the [place] [facility] program. Pt tolerated exercise well with an average MET level of 3.53. He is off to a good start and is leanring his THRR, RPE and ExRx Reviewed MET's and goals. Pt tolerated exercise well with an average MET level of 3.82. He has been absent due to travel, but it back today and feeling well. MET's a little lower today, but he will get back into the routine soon. He feels good about his goals and is getting great info from the RD about heart healthy nutrition, and he is feeling better about his balance.      Expected Outcomes Will continue to monitor pt and progress workloads as tolerated without sign or symptom Will continue to monitor pt and progress workloads as tolerated without sign or symptom         Discharge Exercise Prescription (Final Exercise Prescription Changes):  Exercise Prescription Changes - 04/10/24 0825       Response to Exercise   Blood Pressure (Admit) 96/70    Blood Pressure (Exit) 110/64    Heart Rate (Admit) 87 bpm    Heart Rate (Exercise) 114 bpm     Heart Rate (Exit) 93 bpm    Rating of Perceived Exertion (Exercise) 10.5    Perceived Dyspnea (Exercise) 0    Symptoms none    Comments Reviewed MET's and goals    Duration Progress to 30 minutes of  aerobic without signs/symptoms of physical distress    Intensity THRR unchanged      Progression   Progression Continue to progress workloads to maintain intensity without signs/symptoms of physical distress.    Average METs 3.82      Resistance Training   Training Prescription No    Weight 3    Reps 10-15    Time 10 Minutes      NuStep   Level 3    SPM 149    Minutes 15    METs 4.2      Track   Laps 19    Minutes 15    METs 3.43          Nutrition:  Target Goals: Understanding of nutrition guidelines, daily intake of sodium 1500mg , cholesterol 200mg , calories 30% from fat and 7% or less from saturated fats, daily to have 5 or more servings of fruits and vegetables.  Education: Nutrition 1 -Group instruction provided by verbal, written material, interactive activities, discussions, models, and posters to present general guidelines for heart healthy nutrition including macronutrients, label reading, and promoting whole foods over processed counterparts. Education serves as pensions consultant of discussion of heart healthy eating for all. Written material provided at class time.    Education: Nutrition 2 -Group instruction provided by verbal, written material, interactive activities, discussions, models, and posters to present general guidelines for heart healthy nutrition including sodium, cholesterol, and saturated fat. Providing guidance of habit forming to improve blood pressure, cholesterol, and body weight. Written material provided at class time.     Biometrics:  Pre Biometrics - 02/29/24 0830       Pre Biometrics   Waist Circumference 42 inches    Hip Circumference 40 inches    Waist to Hip Ratio 1.05 %    Triceps Skinfold 6 mm    % Body  Fat 25 %    Grip Strength  36 kg    Flexibility 16.75 in    Single Leg Stand 10 seconds           Nutrition Therapy Plan and Nutrition Goals:   Nutrition Assessments:  MEDIFICTS Score Key: >=70 Need to make dietary changes  40-70 Heart Healthy Diet <= 40 Therapeutic Level Cholesterol Diet   Picture Your Plate Scores: <59 Unhealthy dietary pattern with much room for improvement. 41-50 Dietary pattern unlikely to meet recommendations for good health and room for improvement. 51-60 More healthful dietary pattern, with some room for improvement.  >60 Healthy dietary pattern, although there may be some specific behaviors that could be improved.    Nutrition Goals Re-Evaluation:   Nutrition Goals Discharge (Final Nutrition Goals Re-Evaluation):   Psychosocial: Target Goals: Acknowledge presence or absence of significant depression and/or stress, maximize coping skills, provide positive support system. Participant is able to verbalize types and ability to use techniques and skills needed for reducing stress and depression.   Education: Stress, Anxiety, and Depression - Group verbal and visual presentation to define topics covered.  Reviews how body is impacted by stress, anxiety, and depression.  Also discusses healthy ways to reduce stress and to treat/manage anxiety and depression. Written material provided at class time.   Education: Sleep Hygiene -Provides group verbal and written instruction about how sleep can affect your health.  Define sleep hygiene, discuss sleep cycles and impact of sleep habits. Review good sleep hygiene tips.   Initial Review & Psychosocial Screening:  Initial Psych Review & Screening - 03/11/24 1701       Initial Review   Current issues with --      Family Dynamics   Good Support System? --      Barriers   Psychosocial barriers to participate in program --      Screening Interventions   Interventions --    Expected Outcomes --          Quality of Life  Scores:   Quality of Life - 02/29/24 1125       Quality of Life   Select Quality of Life      Quality of Life Scores   Health/Function Pre 24.37 %    Socioeconomic Pre 27.93 %    Psych/Spiritual Pre 26.57 %    Family Pre 25.13 %    GLOBAL Pre 25.68 %         Scores of 19 and below usually indicate a poorer quality of life in these areas.  A difference of  2-3 points is a clinically meaningful difference.  A difference of 2-3 points in the total score of the Quality of Life Index has been associated with significant improvement in overall quality of life, self-image, physical symptoms, and general health in studies assessing change in quality of life.  PHQ-9: Review Flowsheet       02/29/2024 04/13/2015 01/05/2015  Depression screen PHQ 2/9  Decreased Interest 0 0 0  Down, Depressed, Hopeless 0 0 0  PHQ - 2 Score 0 0 0  Altered sleeping 0 - -  Tired, decreased energy 1 - -  Change in appetite 0 - -  Feeling bad or failure about yourself  0 - -  Trouble concentrating 0 - -  Moving slowly or fidgety/restless 0 - -  Suicidal thoughts 0 - -  PHQ-9 Score 1  - -  Difficult doing work/chores Not difficult at all - -  Details       Data saved with a previous flowsheet row definition        Interpretation of Total Score  Total Score Depression Severity:  1-4 = Minimal depression, 5-9 = Mild depression, 10-14 = Moderate depression, 15-19 = Moderately severe depression, 20-27 = Severe depression   Psychosocial Evaluation and Intervention:   Psychosocial Re-Evaluation:  Psychosocial Re-Evaluation     Row Name 03/11/24 [phone]             Psychosocial Re-Evaluation   Current issues with None Identified       Interventions Encouraged to attend Cardiac Rehabilitation for the exercise       Continue Psychosocial Services  No Follow up required          Psychosocial Discharge (Final Psychosocial Re-Evaluation):  Psychosocial Re-Evaluation - 03/11/24 0846        Psychosocial Re-Evaluation   Current issues with None Identified    Interventions Encouraged to attend Cardiac Rehabilitation for the exercise    Continue Psychosocial Services  No Follow up required          Vocational Rehabilitation: Provide vocational rehab assistance to qualifying candidates.   Vocational Rehab Evaluation & Intervention:  Vocational Rehab - 02/29/24 0851       Initial Vocational Rehab Evaluation & Intervention   Assessment shows need for Vocational Rehabilitation No   Patient is retired         Education: Education Goals: Education classes will be provided on a variety of topics geared toward better understanding of heart health and risk factor modification. Participant will state understanding/return demonstration of topics presented as noted by education test scores.  Learning Barriers/Preferences:  Learning Barriers/Preferences - 02/29/24 0851       Learning Barriers/Preferences   Learning Barriers Sight;Exercise Concerns   Right eye almost blind   Learning Preferences Group Instruction;Individual Instruction;Skilled Demonstration;Written Material;Pictoral          General Cardiac Education Topics:  AED/CPR: - Group verbal and written instruction with the use of models to demonstrate the basic use of the AED with the basic ABC's of resuscitation.   Test and Procedures: - Group verbal and visual presentation and models provide information about basic cardiac anatomy and function. Reviews the testing methods done to diagnose heart disease and the outcomes of the test results. Describes the treatment choices: Medical Management, Angioplasty, or Coronary Bypass Surgery for treating various heart conditions including Myocardial Infarction, Angina, Valve Disease, and Cardiac Arrhythmias. Written material provided at class time.   Medication Safety: - Group verbal and visual instruction to review commonly prescribed medications for heart and lung  disease. Reviews the medication, class of the drug, and side effects. Includes the steps to properly store meds and maintain the prescription regimen. Written material provided at class time.   Intimacy: - Group verbal instruction through game format to discuss how heart and lung disease can affect sexual intimacy. Written material provided at class time.   Know Your Numbers and Heart Failure: - Group verbal and visual instruction to discuss disease risk factors for cardiac and pulmonary disease and treatment options.  Reviews associated critical values for Overweight/Obesity, Hypertension, Cholesterol, and Diabetes.  Discusses basics of heart failure: signs/symptoms and treatments.  Introduces Heart Failure Zone chart for action plan for heart failure. Written material provided at class time.   Infection Prevention: - Provides verbal and written material to individual with discussion of infection control including proper hand washing and proper equipment cleaning during exercise  session.   Falls Prevention: - Provides verbal and written material to individual with discussion of falls prevention and safety.   Other: -Provides group and verbal instruction on various topics (see comments)   Knowledge Questionnaire Score:  Knowledge Questionnaire Score - 02/29/24 1123       Knowledge Questionnaire Score   Pre Score 22/24          Core Components/Risk Factors/Patient Goals at Admission:  Personal Goals and Risk Factors at Admission - 02/29/24 0852       Core Components/Risk Factors/Patient Goals on Admission   Diabetes Yes    Intervention Provide education about signs/symptoms and action to take for hypo/hyperglycemia.;Provide education about proper nutrition, including hydration, and aerobic/resistive exercise prescription along with prescribed medications to achieve blood glucose in normal ranges: Fasting glucose 65-99 mg/dL    Expected Outcomes Short Term: Participant  verbalizes understanding of the signs/symptoms and immediate care of hyper/hypoglycemia, proper foot care and importance of medication, aerobic/resistive exercise and nutrition plan for blood glucose control.;Long Term: Attainment of HbA1C < 7%.    Heart Failure Yes    Intervention Provide a combined exercise and nutrition program that is supplemented with education, support and counseling about heart failure. Directed toward relieving symptoms such as shortness of breath, decreased exercise tolerance, and extremity edema.    Expected Outcomes Improve functional capacity of life;Short term: Attendance in program 2-3 days a week with increased exercise capacity. Reported lower sodium intake. Reported increased fruit and vegetable intake. Reports medication compliance.;Short term: Daily weights obtained and reported for increase. Utilizing diuretic protocols set by physician.;Long term: Adoption of self-care skills and reduction of barriers for early signs and symptoms recognition and intervention leading to self-care maintenance.    Hypertension Yes    Intervention Provide education on lifestyle modifcations including regular physical activity/exercise, weight management, moderate sodium restriction and increased consumption of fresh fruit, vegetables, and low fat dairy, alcohol moderation, and smoking cessation.;Monitor prescription use compliance.    Expected Outcomes Short Term: Continued assessment and intervention until BP is < 140/87mm HG in hypertensive participants. < 130/27mm HG in hypertensive participants with diabetes, heart failure or chronic kidney disease.;Long Term: Maintenance of blood pressure at goal levels.    Lipids Yes    Intervention Provide education and support for participant on nutrition & aerobic/resistive exercise along with prescribed medications to achieve LDL 70mg , HDL >40mg .    Expected Outcomes Short Term: Participant states understanding of desired cholesterol values and  is compliant with medications prescribed. Participant is following exercise prescription and nutrition guidelines.;Long Term: Cholesterol controlled with medications as prescribed, with individualized exercise RX and with personalized nutrition plan. Value goals: LDL < 70mg , HDL > 40 mg.    Stress Yes    Intervention Offer individual and/or small group education and counseling on adjustment to heart disease, stress management and health-related lifestyle change. Teach and support self-help strategies.;Refer participants experiencing significant psychosocial distress to appropriate mental health specialists for further evaluation and treatment. When possible, include family members and significant others in education/counseling sessions.    Expected Outcomes Short Term: Participant demonstrates changes in health-related behavior, relaxation and other stress management skills, ability to obtain effective social support, and compliance with psychotropic medications if prescribed.;Long Term: Emotional wellbeing is indicated by absence of clinically significant psychosocial distress or social isolation.    Personal Goal Other Yes    Personal Goal ST, get into Ex routine, heart healthy eating, LT balnce    Intervention Will continue to monitor pt  Expected Outcomes Pt will achieve his goals          Education:Diabetes - Individual verbal and written instruction to review signs/symptoms of diabetes, desired ranges of glucose level fasting, after meals and with exercise. Acknowledge that pre and post exercise glucose checks will be done for 3 sessions at entry of program.   Core Components/Risk Factors/Patient Goals Review:   Goals and Risk Factor Review     Row Name 03/11/24 0847             Core Components/Risk Factors/Patient Goals Review   Personal Goals Review Diabetes;Heart Failure;Hypertension;Lipids;Stress       Review Hayden Williams doing well with exercise at cardiac rehab. Vital signs and  CBG's have been stable. Hayden Williams has only had one week of CR sessions since orientation MET levels were obtained and is maintaining these at this time.       Expected Outcomes Hayden Williams will continue to particpate in cardiac rehab for exercise nutriton and lifestyle modifications. Hayden Williams will continue to particpate in cardiac rehab for exercise nutriton and lifestyle modifications.          Core Components/Risk Factors/Patient Goals at Discharge (Final Review):   Goals and Risk Factor Review - 03/11/24 0847       Core Components/Risk Factors/Patient Goals Review   Personal Goals Review Diabetes;Heart Failure;Hypertension;Lipids;Stress    Review Hayden Williams doing well with exercise at cardiac rehab. Vital signs and CBG's have been stable. Hayden Williams has only had one week of CR sessions since orientation MET levels were obtained and is maintaining these at this time.    Expected Outcomes Zalman will continue to particpate in cardiac rehab for exercise nutriton and lifestyle modifications. Hayden Williams will continue to particpate in cardiac rehab for exercise nutriton and lifestyle modifications.          ITP Comments:  ITP Comments     Row Name 02/29/24 0755 03/04/24 0900         ITP Comments Dr. Wilbert Bihari medical director. Introduction to pritikin education/intensive cardiac rehab. Initial orientation packet reviewed with patient. 30 Day ITP Review. Grey started cardiac rehab today (03/04/24) and tolerated exercise well.         Comments: see ITP comments

## 2024-04-12 ENCOUNTER — Encounter (HOSPITAL_COMMUNITY)
Admission: RE | Admit: 2024-04-12 | Discharge: 2024-04-12 | Disposition: A | Discharge status: Home / Self Care | Payer: Medicare (Managed Care) | Source: Ambulatory Visit | Attending: Internal Medicine | Admitting: Internal Medicine

## 2024-04-12 DIAGNOSIS — I214 Non-ST elevation (NSTEMI) myocardial infarction: Secondary | ICD-10-CM | POA: Diagnosis not present

## 2024-04-12 DIAGNOSIS — Z955 Presence of coronary angioplasty implant and graft: Secondary | ICD-10-CM

## 2024-04-15 ENCOUNTER — Encounter (HOSPITAL_COMMUNITY)
Admission: RE | Admit: 2024-04-15 | Discharge: 2024-04-15 | Disposition: A | Payer: Medicare (Managed Care) | Source: Ambulatory Visit | Attending: Internal Medicine

## 2024-04-15 DIAGNOSIS — Z955 Presence of coronary angioplasty implant and graft: Secondary | ICD-10-CM

## 2024-04-15 DIAGNOSIS — I214 Non-ST elevation (NSTEMI) myocardial infarction: Secondary | ICD-10-CM | POA: Diagnosis not present

## 2024-04-16 ENCOUNTER — Ambulatory Visit: Payer: Medicare (Managed Care) | Attending: Internal Medicine | Admitting: Internal Medicine

## 2024-04-16 VITALS — BP 124/79 | HR 73 | Ht 71.0 in | Wt 196.0 lb

## 2024-04-16 DIAGNOSIS — I10 Essential (primary) hypertension: Secondary | ICD-10-CM

## 2024-04-16 DIAGNOSIS — I2510 Atherosclerotic heart disease of native coronary artery without angina pectoris: Secondary | ICD-10-CM

## 2024-04-16 DIAGNOSIS — E1122 Type 2 diabetes mellitus with diabetic chronic kidney disease: Secondary | ICD-10-CM

## 2024-04-16 DIAGNOSIS — Z794 Long term (current) use of insulin: Secondary | ICD-10-CM

## 2024-04-16 DIAGNOSIS — N1832 Chronic kidney disease, stage 3b: Secondary | ICD-10-CM

## 2024-04-16 MED ORDER — METOPROLOL TARTRATE 100 MG PO TABS: 100.0000 mg | ORAL_TABLET | Freq: Two times a day (BID) | ORAL | 3 refills | Status: AC

## 2024-04-16 NOTE — Progress Notes (Signed)
 Cardiology Office Note:  .    Date:  04/16/2024  ID:  Hayden Williams, DOB [birth year], MRN [id number] PCP: Hayden Elouise SQUIBB, DO  [facility] Providers Cardiologist:  Hayden DELENA Leavens, MD Cardiology APP:  Hayden Jon Garre, PA     CC: Secondary prevention visit.  History of Present Illness: .    Hayden Williams is a 68 y.o. male  with persistent atrial fibrillation and hypertension who presents with hypotension and bradycardia. He has a history of persistent atrial fibrillation. Currently, he is experiencing hypotension and bradycardia. He regularly monitors his blood pressure at home and has noted it to be lower than during previous visits. He has hypertension associated with hypertensive heart disease. However, his blood pressure readings are lower than usual. He has mild nonobstructive coronary artery disease with no symptoms such as chest pain, chest pressure, or chest tightness. His LDL cholesterol has been consistently under 70 since 2018. He has a heart murmur related to mild mitral regurgitation. An echocardiogram showed severe left ventricular hypertrophy and mild mitral regurgitation. He has chronic kidney disease, managed by a nurse practitioner named Amy. His creatinine is elevated, but his potassium levels are stable. He also has iron deficiency anemia, which is improving under Amy's management. He is currently taking multiple medications, including carvedilol, and has recently started an SGLT2 inhibitor. He no longer takes a medication for constipation, opting for an over-the-counter alternative instead. Socially, he maintains an active lifestyle, going to the gym regularly for 30 minutes each morning. He reports feeling overall good, with no breathing difficulties.   Discussed the use of AI scribe software for clinical note transcription with the patient, who gave verbal consent to proceed.   Relevant histories: .  Social  2016- RCA PCI 2025: Transferred to me, [facility]X  PCI; Went to [place] [facility] care- Angie Duke  ROS: As per HPI.   Studies Reviewed: .     Cardiac Studies & Procedures   ______________________________________________________________________________________________ CARDIAC CATHETERIZATION  CARDIAC CATHETERIZATION 01/03/2024  Conclusion   RPAV lesion is 50% stenosed.   Prox LAD to Mid LAD lesion is 50% stenosed.   Dist LAD lesion is 60% stenosed.   Ramus lesion is 40% stenosed.   1st Mrg lesion is 40% stenosed.   2nd Mrg lesion is 40% stenosed.   Prox RCA lesion is 99% stenosed.   Ost LM lesion is 20% stenosed.   Mid RCA to Dist RCA lesion is 55% stenosed.   A drug-eluting stent was successfully placed using a STENT SYNERGY XD 2.75X16.   Post intervention, there is a 0% residual stenosis.   In the absence of any other complications or medical issues, we expect the patient to be ready for discharge from an interventional cardiology perspective on 01/04/2024.   Recommend uninterrupted dual antiplatelet therapy with Aspirin  81mg  daily and Ticagrelor  90mg  twice daily for a minimum of 12 months (ACS-Class I recommendation).  1.  Severe one-vessel coronary artery disease with evidence of plaque rupture in the proximal right coronary artery with TIMI-2 flow.  This seems to be the culprit for non-STEMI.  Patent distal RCA stent with moderate in-stent restenosis.  Stable moderate disease affecting the LAD and left circumflex. 2.  Left ventricular angiography was not performed.  EF was mildly reduced by echo. 3.  Moderately elevated left ventricular end-diastolic pressure at 26 mmHg. 4.  Successful angioplasty and drug-eluting stent placement to the proximal right coronary artery.  Recommendations: Aggressive treatment of risk factors.  Given elevated  LVEDP, I stopped IV fluids.  Monitor renal function postcontrast exposure.  Only 40 mL of contrast was used for the procedure.  Findings Coronary Findings Diagnostic  Dominance: Right  Left  Main Ost LM lesion is 20% stenosed.  Left Anterior Descending Prox LAD to Mid LAD lesion is 50% stenosed. Dist LAD lesion is 60% stenosed.  Ramus Intermedius Ramus lesion is 40% stenosed.  Left Circumflex Vessel is small.  First Obtuse Marginal Branch Vessel is small in size. 1st Mrg lesion is 40% stenosed.  Second Obtuse Marginal Branch Vessel is small in size. 2nd Mrg lesion is 40% stenosed.  Right Coronary Artery Prox RCA lesion is 99% stenosed. Vessel is the culprit lesion. The lesion is not complex (non high-C). The lesion was not previously treated . Mid RCA to Dist RCA lesion is 55% stenosed. The lesion was previously treated using a drug eluting stent over 2 years ago. Previously placed stent displays restenosis.  Right Posterior Atrioventricular Artery RPAV lesion is 50% stenosed. The lesion is located at the bend and discrete.  Intervention  Prox RCA lesion Stent Lesion length:  14 mm. CATH LAUNCHER [id number] guide catheter was inserted. Lesion crossed with guidewire using a WIRE RUNTHROUGH .[id number]. Pre-stent angioplasty was performed using a BALLOON EMERGE MR 2.5X15. Maximum pressure:  10 atm. Inflation time:  20 sec. A drug-eluting stent was successfully placed using a STENT SYNERGY XD 2.75X16. Maximum pressure: 12 atm. Inflation time: 20 sec. Post-stent angioplasty was performed using a BALLOON SAPPHIRE NC24 3.0X10. Maximum pressure:  14 atm. Inflation time:  20 sec. Post-Intervention Lesion Assessment The intervention was successful. Pre-interventional TIMI flow is 2. Post-intervention TIMI flow is 3. No complications occurred at this lesion. There is a 0% residual stenosis post intervention.   CARDIAC CATHETERIZATION  CARDIAC CATHETERIZATION 12/02/2014  Conclusion  Post Atrio lesion, 50% stenosed.  1st Mrg lesion, 40% stenosed.  2nd Mrg lesion, 40% stenosed.  Ramus lesion, 40% stenosed.  Prox LAD to Mid LAD lesion, 50% stenosed.  Dist LAD lesion,  60% stenosed.  Dist RCA lesion, 95% stenosed. There is a 0% residual stenosis post intervention.  A drug-eluting stent was placed.  The left ventricular systolic function is normal.  1. Severe stenosis distal RCA, NSTEMI culprit lesion 2. Successful PTCA/DES x 1 distal RCA 3. Diffuse small vessel disease in the small distal LAD and small obtuse marginal branches. 4. Normal LV systolic function  Recommendations: Will continue ASA and Plavix  for at least one year. Will start statin and beta blocker.  Findings Coronary Findings Diagnostic  Dominance: Right  Left Anterior Descending  Ramus Intermedius  Left Circumflex The vessel is small .  First Obtuse Marginal Branch The vessel is small in size.  Second Obtuse Marginal Branch The vessel is small in size.  Right Coronary Artery discrete .  Right Posterior Atrioventricular Artery discrete located at the bend .  Intervention  Dist RCA lesion PCI The pre-interventional distal flow is normal (TIMI 3). Pre-stent angioplasty was performed. A drug-eluting stent was placed. Minimum lumen area: 2.5 mm. The strut is apposed. Post-stent angioplasty was performed. Maximum pressure: 15 atm. The post-interventional distal flow is normal (TIMI 3). The intervention was successful. No complications occurred at this lesion. Pressure wire/FFR was not performed on the lesion. There is a 0% residual stenosis post intervention.   STRESS TESTS  MYOCARDIAL PERFUSION IMAGING 06/23/2017  Interpretation Summary  The left ventricular ejection fraction is normal (55-65%).  Nuclear stress EF: 63%.  Blood pressure demonstrated a  normal response to exercise.  No T wave inversion was noted during stress.  There was no ST segment deviation noted during stress.  The study is normal.  This is a low risk study.  Normal perfusion. LVEF 63% with normal wall motion. This is a low risk study.   ECHOCARDIOGRAM  ECHOCARDIOGRAM LIMITED  01/02/2024  Narrative ECHOCARDIOGRAM LIMITED REPORT    Patient Name:   Hayden Williams Date of Exam: 01/02/2024 Medical Rec #:  [id number]      Height:       71.0 in Accession #:    [id number]     Weight:       215.0 lb Date of Birth:  Feb 04, 1956      BSA:          2.174 m Patient Age:    68 years       BP:           115/51 mmHg Patient Gender: M              HR:           72 bpm. Exam Location:  Inpatient  Procedure: Limited Echo, Cardiac Doppler and Color Doppler (Both Spectral and Color Flow Doppler were utilized during procedure).  Indications:    Elevated Troponin  History:        Patient has prior history of Echocardiogram examinations, most recent 10/26/2023. Previous Myocardial Infarction, CAD and Angina, Stroke and CKD, stage 3, Signs/Symptoms:Chest Pain; Risk Factors:Hypertension and Diabetes.  Sonographer:    Thea Norlander RCS Referring Phys: [phone] A CALDWELL POWELL JR  IMPRESSIONS   1. Left ventricular ejection fraction, by estimation, is 50%. The left ventricle has mildly decreased function. The left ventricle demonstrates regional wall motion abnormalities with basal inferoseptal severe hypokinesis, basal to mid inferior akinesis, and basal inferolateral severe hypokinesis. Left ventricular diastolic parameters are consistent with Grade I diastolic dysfunction (impaired relaxation). 2. Right ventricular systolic function is mildly reduced. The right ventricular size is mildly enlarged. Tricuspid regurgitation signal is inadequate for assessing PA pressure. 3. The mitral valve is normal in structure. Trivial mitral valve regurgitation. No evidence of mitral stenosis. 4. The aortic valve is tricuspid. Aortic valve regurgitation is not visualized. No aortic stenosis is present. 5. The inferior vena cava is normal in size with greater than 50% respiratory variability, suggesting right atrial pressure of 3 mmHg.  FINDINGS Left Ventricle: Left ventricular ejection  fraction, by estimation, is 50%. The left ventricle has mildly decreased function. The left ventricle demonstrates regional wall motion abnormalities. The left ventricular internal cavity size was normal in size. There is no left ventricular hypertrophy. Left ventricular diastolic parameters are consistent with Grade I diastolic dysfunction (impaired relaxation).  Right Ventricle: The right ventricular size is mildly enlarged. Right ventricular systolic function is mildly reduced. Tricuspid regurgitation signal is inadequate for assessing PA pressure.  Mitral Valve: The mitral valve is normal in structure. Trivial mitral valve regurgitation. No evidence of mitral valve stenosis.  Tricuspid Valve: The tricuspid valve is normal in structure. Tricuspid valve regurgitation is not demonstrated.  Aortic Valve: The aortic valve is tricuspid. Aortic valve regurgitation is not visualized. No aortic stenosis is present. Aortic valve peak gradient measures 3.9 mmHg.  Pulmonic Valve: The pulmonic valve was normal in structure. Pulmonic valve regurgitation is trivial.  Aorta: The aortic root is normal in size and structure.  Venous: The inferior vena cava is normal in size with greater than 50% respiratory variability, suggesting right atrial pressure  of 3 mmHg.  LEFT VENTRICLE PLAX 2D LVIDd:         4.50 cm   Diastology LVIDs:         3.00 cm   LV e' medial:    5.11 cm/s LV PW:         1.00 cm   LV E/e' medial:  8.8 LV IVS:        0.90 cm   LV e' lateral:   6.59 cm/s LVOT diam:     2.30 cm   LV E/e' lateral: 6.8 LV SV:         65 LV SV Index:   30 LVOT Area:     4.15 cm   RIGHT VENTRICLE            IVC RV S prime:     6.34 cm/s  IVC diam: 1.70 cm TAPSE (M-mode): 2.0 cm  LEFT ATRIUM         Index LA diam:    3.90 cm 1.79 cm/m AORTIC VALVE AV Area (Vmax): 3.26 cm AV Vmax:        98.75 cm/s AV Peak Grad:   3.9 mmHg LVOT Vmax:      77.55 cm/s LVOT Vmean:     51.650 cm/s LVOT VTI:        0.156 m  AORTA Ao Root diam: 3.70 cm Ao Asc diam:  3.40 cm  MITRAL VALVE MV Area (PHT): 4.06 cm    SHUNTS MV Decel Time: 187 msec    Systemic VTI:  0.16 m MV E velocity: 45.10 cm/s  Systemic Diam: 2.30 cm MV A velocity: 75.50 cm/s MV E/A ratio:  0.60  Dalton McleanMD Electronically signed by Ezra Kanner Signature Date/Time: 01/02/2024/3:15:35 PM    Final          ______________________________________________________________________________________________       Physical Exam:    VS:  BP 124/79 (BP Location: Left Arm)   Pulse 73   Ht 5' 11 (1.803 m)   Wt 196 lb (88.9 kg)   SpO2 95%   BMI 27.34 kg/m    Wt Readings from Last 3 Encounters:  04/16/24 196 lb (88.9 kg)  02/29/24 207 lb 0.2 oz (93.9 kg)  01/15/24 219 lb 6.4 oz (99.5 kg)    Gen: no distress   Neck: No JVD,  Cardiac: No Rubs or Gallops, no Murmur, RRR +2 radial pulses Respiratory: Clear to auscultation bilaterally, normal effort, normal  respiratory rate GI: Soft, nontender, non-distended  MS: No  edema;  moves all extremities Integument: Skin feels warm Neuro:  At time of evaluation, alert and oriented to person/place/time/situation  Psych: Normal affect, patient feels well       ASSESSMENT AND PLAN: .    Coronary artery disease with recent NSTEMI and RCA stent Recent NSTEMI with RCA stent placement in August 2025 HLD - Currently on dual antiplatelet therapy with aspirin  and ticagrelor . No current cough or adverse effects from Brilinta .  - Cardiac rehab nearing completion. Plan to deescalate antiplatelet therapy to Plavix  at stent anniversary in August.  - Discussion with urology regarding potential prostate biopsy and temporary cessation of antiplatelet therapy. Risks of hematuria discussed if antiplatelet therapy is interrupted for biopsy. Cautiously optimistic about future management without additional medication if current trends continue.  - Continue dual antiplatelet therapy with  aspirin  and ticagrelor  until August.  - Will deescalate to Plavix  at stent anniversary in August.  - Sent message to urology for preoperative risk stratification prior  to potential prostate biopsy Dr. Delphia [phone]  - Discussed with urology the possibility of temporarily stopping antiplatelet therapy for biopsy and resuming as soon as safely possible.  - LDL goal < 55  Chronic kidney disease stage 3B  -Kidney function well-managed with creatinine levels around 2.0. No recent tests, but upcoming appointment in January.  - Continue to monitor kidney function with upcoming appointment in March.  HTN with DM -  Continue current dietary modifications that he learned in [place] - Continue current antihypertensive regimen.   Longitudinal care: The evaluation and management services provided today reflect the complexity inherent in caring for this patient, including the ongoing longitudinal relationship and management of multiple chronic conditions and/or the need for care coordination. The visit required a comprehensive assessment and management plan tailored to the patient's unique needs Time was spent addressing not only the acute concerns but also the broader context of the patient's health, including preventive care, chronic disease management, and care coordination as appropriate.  Complex longitudinal is necessary for conditions including: comprehensive secondary prevention with LDL goal < 55, BP goal < 135/85, and with continued and sustained weight loss   Hayden Leavens, MD FASE [facility] Cardiologist [facility]  [address] [place], [place] [address] [phone]  9:25 AM

## 2024-04-16 NOTE — Patient Instructions (Signed)
 Medication Instructions:  Your physician recommends that you continue on your current medications as directed. Please refer to the Current Medication list given to you today.  *If you need a refill on your cardiac medications before your next appointment, please call your pharmacy*  Lab Work: NONE  If you have labs (blood work) drawn today and your tests are completely normal, you will receive your results only by: MyChart Message (if you have MyChart) OR A paper copy in the mail If you have any lab test that is abnormal or we need to change your treatment, we will call you to review the results.  Testing/Procedures: NONE  Follow-Up: At [facility], you and your health needs are our priority.  As part of our continuing mission to provide you with exceptional heart care, our providers are all part of one team.  This team includes your primary Cardiologist (physician) and Advanced Practice Providers or APPs (Physician Assistants and Nurse Practitioners) who all work together to provide you with the care you need, when you need it.  Your next appointment:   8 month(s)  Provider:   Stanly DELENA Leavens, MD

## 2024-04-17 ENCOUNTER — Encounter (HOSPITAL_COMMUNITY)
Admission: RE | Admit: 2024-04-17 | Discharge: 2024-04-17 | Disposition: A | Discharge status: Home / Self Care | Payer: Medicare (Managed Care) | Source: Ambulatory Visit | Attending: Internal Medicine | Admitting: Internal Medicine

## 2024-04-17 ENCOUNTER — Encounter (HOSPITAL_COMMUNITY): Payer: Self-pay

## 2024-04-17 DIAGNOSIS — I214 Non-ST elevation (NSTEMI) myocardial infarction: Secondary | ICD-10-CM | POA: Diagnosis not present

## 2024-04-17 DIAGNOSIS — Z955 Presence of coronary angioplasty implant and graft: Secondary | ICD-10-CM

## 2024-04-17 NOTE — Progress Notes (Signed)
 Discharge Progress Report  Patient Details  Name: Hayden Williams MRN: [id number] Date of Birth: 1956-03-28 Referring Provider:   Flowsheet Row CARDIAC REHAB PHASE II ORIENTATION from 02/29/2024 in [facility]  Referring Provider Dr. Stanly Leavens MD     Number of Visits: 38  Reason for Discharge:  Patient reached a stable level of exercise. Patient independent in their exercise. Patient has met program and personal goals.  Smoking History:  Social History   Tobacco Use  Smoking Status Never  Smokeless Tobacco Never    Diagnosis:  01/02/24 NSTEMI  01/03/24 DES RCA  ADL UCSD:   Initial Exercise Prescription:  Initial Exercise Prescription - 02/29/24 1300       Date of Initial Exercise RX and Referring Provider   Date 02/29/24    Referring Provider Dr. Stanly Leavens MD    Expected Discharge Date 04/10/24      NuStep   Level 2    SPM 75    Minutes 15    METs 2.1      Track   Laps 15    Minutes 15    METs 2.2      Prescription Details   Frequency (times per week) 3    Duration Progress to 30 minutes of continuous aerobic without signs/symptoms of physical distress      Intensity   THRR 40-80% of Max Heartrate 61-122    Ratings of Perceived Exertion 11-13    Perceived Dyspnea 0-4      Progression   Progression Continue progressive overload as per policy without signs/symptoms or physical distress.      Resistance Training   Training Prescription Yes    Weight 3    Reps 10-15          Discharge Exercise Prescription (Final Exercise Prescription Changes):  Exercise Prescription Changes - 04/10/24 0825       Response to Exercise   Blood Pressure (Admit) 96/70    Blood Pressure (Exit) 110/64    Heart Rate (Admit) 87 bpm    Heart Rate (Exercise) 114 bpm    Heart Rate (Exit) 93 bpm    Rating of Perceived Exertion (Exercise) 10.5    Perceived Dyspnea (Exercise) 0    Symptoms  none    Comments Reviewed MET's and goals    Duration Progress to 30 minutes of  aerobic without signs/symptoms of physical distress    Intensity THRR unchanged      Progression   Progression Continue to progress workloads to maintain intensity without signs/symptoms of physical distress.    Average METs 3.82      Resistance Training   Training Prescription No    Weight 3    Reps 10-15    Time 10 Minutes      NuStep   Level 3    SPM 149    Minutes 15    METs 4.2      Track   Laps 19    Minutes 15    METs 3.43          Functional Capacity:  6 Minute Walk     Row Name 02/29/24 1136         6 Minute Walk   Phase Initial     Distance 1340 feet     Walk Time 6 minutes     # of Rest Breaks 0     MPH 2.54     METS 3.3  RPE 11     Perceived Dyspnea  0     VO2 Peak 11.5     Symptoms No     Resting HR 83 bpm     Resting BP 122/78     Resting Oxygen Saturation  96 %     Exercise Oxygen Saturation  during 6 min walk 98 %     Max Ex. HR 104 bpm     Max Ex. BP 154/82     2 Minute Post BP 132/80        Psychological, QOL, Others - Outcomes: PHQ 2/9:    04/17/2024    7:51 AM 02/29/2024   11:21 AM 04/13/2015   10:37 AM 01/05/2015    4:28 PM  Depression screen PHQ 2/9  Decreased Interest 0 0 0 0  Down, Depressed, Hopeless 0 0 0 0  PHQ - 2 Score 0 0 0 0  Altered sleeping 0 0    Tired, decreased energy 0 1    Change in appetite 0 0    Feeling bad or failure about yourself  0 0    Trouble concentrating 0 0    Moving slowly or fidgety/restless 0 0    Suicidal thoughts 0 0    PHQ-9 Score 0 1     Difficult doing work/chores  Not difficult at all       Data saved with a previous flowsheet row definition    Quality of Life:  Quality of Life - 02/29/24 1125       Quality of Life   Select Quality of Life      Quality of Life Scores   Health/Function Pre 24.37 %    Socioeconomic Pre 27.93 %    Psych/Spiritual Pre 26.57 %    Family Pre 25.13 %     GLOBAL Pre 25.68 %          Personal Goals: Goals established at orientation with interventions provided to work toward goal.  Personal Goals and Risk Factors at Admission - 02/29/24 0852       Core Components/Risk Factors/Patient Goals on Admission   Diabetes Yes    Intervention Provide education about signs/symptoms and action to take for hypo/hyperglycemia.;Provide education about proper nutrition, including hydration, and aerobic/resistive exercise prescription along with prescribed medications to achieve blood glucose in normal ranges: Fasting glucose 65-99 mg/dL    Expected Outcomes Short Term: Participant verbalizes understanding of the signs/symptoms and immediate care of hyper/hypoglycemia, proper foot care and importance of medication, aerobic/resistive exercise and nutrition plan for blood glucose control.;Long Term: Attainment of HbA1C < 7%.    Heart Failure Yes    Intervention Provide a combined exercise and nutrition program that is supplemented with education, support and counseling about heart failure. Directed toward relieving symptoms such as shortness of breath, decreased exercise tolerance, and extremity edema.    Expected Outcomes Improve functional capacity of life;Short term: Attendance in program 2-3 days a week with increased exercise capacity. Reported lower sodium intake. Reported increased fruit and vegetable intake. Reports medication compliance.;Short term: Daily weights obtained and reported for increase. Utilizing diuretic protocols set by physician.;Long term: Adoption of self-care skills and reduction of barriers for early signs and symptoms recognition and intervention leading to self-care maintenance.    Hypertension Yes    Intervention Provide education on lifestyle modifcations including regular physical activity/exercise, weight management, moderate sodium restriction and increased consumption of fresh fruit, vegetables, and low fat dairy, alcohol moderation,  and smoking cessation.;Monitor prescription use  compliance.    Expected Outcomes Short Term: Continued assessment and intervention until BP is < 140/34mm HG in hypertensive participants. < 130/41mm HG in hypertensive participants with diabetes, heart failure or chronic kidney disease.;Long Term: Maintenance of blood pressure at goal levels.    Lipids Yes    Intervention Provide education and support for participant on nutrition & aerobic/resistive exercise along with prescribed medications to achieve LDL 70mg , HDL >40mg .    Expected Outcomes Short Term: Participant states understanding of desired cholesterol values and is compliant with medications prescribed. Participant is following exercise prescription and nutrition guidelines.;Long Term: Cholesterol controlled with medications as prescribed, with individualized exercise RX and with personalized nutrition plan. Value goals: LDL < 70mg , HDL > 40 mg.    Stress Yes    Intervention Offer individual and/or small group education and counseling on adjustment to heart disease, stress management and health-related lifestyle change. Teach and support self-help strategies.;Refer participants experiencing significant psychosocial distress to appropriate mental health specialists for further evaluation and treatment. When possible, include family members and significant others in education/counseling sessions.    Expected Outcomes Short Term: Participant demonstrates changes in health-related behavior, relaxation and other stress management skills, ability to obtain effective social support, and compliance with psychotropic medications if prescribed.;Long Term: Emotional wellbeing is indicated by absence of clinically significant psychosocial distress or social isolation.    Personal Goal Other Yes    Personal Goal ST, get into Ex routine, heart healthy eating, LT balnce    Intervention Will continue to monitor pt    Expected Outcomes Pt will achieve his goals            Personal Goals Discharge:  Goals and Risk Factor Review     Row Name 03/11/24 0847             Core Components/Risk Factors/Patient Goals Review   Personal Goals Review Diabetes;Heart Failure;Hypertension;Lipids;Stress       Review Kale doing well with exercise at cardiac rehab. Vital signs and CBG's have been stable. Wray has only had one week of CR sessions since orientation MET levels were obtained and is maintaining these at this time.       Expected Outcomes Heinrich will continue to particpate in cardiac rehab for exercise nutriton and lifestyle modifications. Sheree will continue to particpate in cardiac rehab for exercise nutriton and lifestyle modifications.          Exercise Goals and Review:  Exercise Goals     Row Name 02/29/24 0849             Exercise Goals   Increase Physical Activity Yes       Intervention Provide advice, education, support and counseling about physical activity/exercise needs.;Develop an individualized exercise prescription for aerobic and resistive training based on initial evaluation findings, risk stratification, comorbidities and participant's personal goals.       Expected Outcomes Short Term: Attend rehab on a regular basis to increase amount of physical activity.;Long Term: Add in home exercise to make exercise part of routine and to increase amount of physical activity.;Long Term: Exercising regularly at least 3-5 days a week.       Able to understand and use rate of perceived exertion (RPE) scale Yes       Intervention Provide education and explanation on how to use RPE scale       Expected Outcomes Short Term: Able to use RPE daily in rehab to express subjective intensity level;Long Term:  Able to use RPE to  guide intensity level when exercising independently       Knowledge and understanding of Target Heart Rate Range (THRR) Yes       Intervention Provide education and explanation of THRR including how the numbers were predicted  and where they are located for reference       Expected Outcomes Short Term: Able to state/look up THRR;Long Term: Able to use THRR to govern intensity when exercising independently;Short Term: Able to use daily as guideline for intensity in rehab       Understanding of Exercise Prescription Yes       Intervention Provide education, explanation, and written materials on patient's individual exercise prescription       Expected Outcomes Short Term: Able to explain program exercise prescription;Long Term: Able to explain home exercise prescription to exercise independently          Exercise Goals Re-Evaluation:  Exercise Goals Re-Evaluation     Row Name 03/04/24 0831 04/10/24 0828           Exercise Goal Re-Evaluation   Exercise Goals Review Increase Physical Activity;Understanding of Exercise Prescription;Increase Strength and Stamina;Knowledge and understanding of Target Heart Rate Range (THRR);Able to understand and use rate of perceived exertion (RPE) scale Increase Physical Activity;Understanding of Exercise Prescription;Increase Strength and Stamina;Knowledge and understanding of Target Heart Rate Range (THRR);Able to understand and use rate of perceived exertion (RPE) scale      Comments Pt first day in the [facility] program. Pt tolerated exercise well with an average MET level of 3.53. He is off to a good start and is leanring his THRR, RPE and ExRx Reviewed MET's and goals. Pt tolerated exercise well with an average MET level of 3.82. He has been absent due to travel, but it back today and feeling well. MET's a little lower today, but he will get back into the routine soon. He feels good about his goals and is getting great info from the RD about heart healthy nutrition, and he is feeling better about his balance.      Expected Outcomes Will continue to monitor pt and progress workloads as tolerated without sign or symptom Will continue to monitor pt and progress workloads as tolerated  without sign or symptom         Nutrition & Weight - Outcomes:  Pre Biometrics - 02/29/24 0830       Pre Biometrics   Waist Circumference 42 inches    Hip Circumference 40 inches    Waist to Hip Ratio 1.05 %    Triceps Skinfold 6 mm    % Body Fat 25 %    Grip Strength 36 kg    Flexibility 16.75 in    Single Leg Stand 10 seconds           Nutrition:   Nutrition Discharge:   Education Questionnaire Score:  Knowledge Questionnaire Score - 02/29/24 1123       Knowledge Questionnaire Score   Pre Score 22/24         Pt graduates from  Intensive/Traditional cardiac rehab program today with completion of 33 exercise and education sessions. Pt maintained good attendance and progressed nicely during their participation in rehab as evidenced by increased MET level.   Medication list reconciled. Repeat  PHQ score-0.  Pt has made significant lifestyle changes and should be commended for his success.  Yousaf achieved his goals during cardiac rehab.   Pt plans to continue exercise at [employer] 3-5x/week.  Goals reviewed with patient;  copy given to patient.

## 2024-04-19 ENCOUNTER — Encounter (HOSPITAL_COMMUNITY)
Admission: RE | Admit: 2024-04-19 | Discharge: 2024-04-19 | Disposition: A | Payer: Medicare (Managed Care) | Source: Ambulatory Visit | Attending: Internal Medicine

## 2024-04-19 DIAGNOSIS — I214 Non-ST elevation (NSTEMI) myocardial infarction: Secondary | ICD-10-CM | POA: Diagnosis not present

## 2024-04-19 DIAGNOSIS — Z955 Presence of coronary angioplasty implant and graft: Secondary | ICD-10-CM

## 2024-04-22 ENCOUNTER — Encounter (HOSPITAL_COMMUNITY)
Admission: RE | Admit: 2024-04-22 | Discharge: 2024-04-22 | Payer: Medicare (Managed Care) | Attending: Internal Medicine

## 2024-04-22 VITALS — Ht 71.0 in | Wt 196.2 lb

## 2024-04-22 DIAGNOSIS — Z955 Presence of coronary angioplasty implant and graft: Secondary | ICD-10-CM

## 2024-04-22 DIAGNOSIS — I214 Non-ST elevation (NSTEMI) myocardial infarction: Secondary | ICD-10-CM
